# Patient Record
Sex: Female | Born: 1940 | Race: Black or African American | Hispanic: No | Smoking: Never smoker
Health system: Southern US, Community
[De-identification: ages and names within clinical notes are randomized; demographics above are authoritative.]

## PROBLEM LIST (undated history)

## (undated) ENCOUNTER — Emergency Department (HOSPITAL_COMMUNITY)

## (undated) DIAGNOSIS — I10 Essential (primary) hypertension: Secondary | ICD-10-CM

## (undated) DIAGNOSIS — I639 Cerebral infarction, unspecified: Secondary | ICD-10-CM

## (undated) DIAGNOSIS — E785 Hyperlipidemia, unspecified: Secondary | ICD-10-CM

## (undated) DIAGNOSIS — M1990 Unspecified osteoarthritis, unspecified site: Secondary | ICD-10-CM

## (undated) DIAGNOSIS — G629 Polyneuropathy, unspecified: Secondary | ICD-10-CM

## (undated) DIAGNOSIS — E1142 Type 2 diabetes mellitus with diabetic polyneuropathy: Secondary | ICD-10-CM

## (undated) DIAGNOSIS — H547 Unspecified visual loss: Secondary | ICD-10-CM

## (undated) DIAGNOSIS — R519 Headache, unspecified: Secondary | ICD-10-CM

## (undated) DIAGNOSIS — R0600 Dyspnea, unspecified: Secondary | ICD-10-CM

## (undated) DIAGNOSIS — Z87898 Personal history of other specified conditions: Secondary | ICD-10-CM

## (undated) HISTORY — DX: Cerebral infarction, unspecified: I63.9

## (undated) HISTORY — DX: Essential (primary) hypertension: I10

## (undated) HISTORY — DX: Hyperlipidemia, unspecified: E78.5

## (undated) HISTORY — PX: ABDOMINAL HYSTERECTOMY: SHX81

## (undated) HISTORY — DX: Unspecified visual loss: H54.7

## (undated) HISTORY — DX: Type 2 diabetes mellitus with diabetic polyneuropathy: E11.42

---

## 2019-01-28 ENCOUNTER — Other Ambulatory Visit (HOSPITAL_COMMUNITY): Payer: Self-pay | Admitting: Internal Medicine

## 2019-01-28 DIAGNOSIS — Z1231 Encounter for screening mammogram for malignant neoplasm of breast: Secondary | ICD-10-CM

## 2019-01-29 ENCOUNTER — Other Ambulatory Visit (HOSPITAL_COMMUNITY): Payer: Self-pay | Admitting: Internal Medicine

## 2019-01-29 DIAGNOSIS — Z780 Asymptomatic menopausal state: Secondary | ICD-10-CM

## 2019-02-09 ENCOUNTER — Other Ambulatory Visit: Payer: Self-pay

## 2019-02-09 ENCOUNTER — Ambulatory Visit (HOSPITAL_COMMUNITY)
Admission: RE | Admit: 2019-02-09 | Discharge: 2019-02-09 | Disposition: A | Discharge status: Home / Self Care | Payer: 59 | Source: Ambulatory Visit | Attending: Internal Medicine | Admitting: Internal Medicine

## 2019-02-09 DIAGNOSIS — Z1231 Encounter for screening mammogram for malignant neoplasm of breast: Secondary | ICD-10-CM | POA: Insufficient documentation

## 2019-02-09 DIAGNOSIS — Z780 Asymptomatic menopausal state: Secondary | ICD-10-CM | POA: Diagnosis not present

## 2019-02-13 ENCOUNTER — Encounter: Payer: Self-pay | Admitting: Internal Medicine

## 2019-02-23 ENCOUNTER — Other Ambulatory Visit (HOSPITAL_COMMUNITY): Payer: Self-pay | Admitting: Internal Medicine

## 2019-02-23 DIAGNOSIS — R928 Other abnormal and inconclusive findings on diagnostic imaging of breast: Secondary | ICD-10-CM

## 2019-02-24 ENCOUNTER — Ambulatory Visit (HOSPITAL_COMMUNITY)
Admission: RE | Admit: 2019-02-24 | Discharge: 2019-02-24 | Disposition: A | Discharge status: Home / Self Care | Payer: 59 | Source: Ambulatory Visit | Attending: Internal Medicine | Admitting: Internal Medicine

## 2019-02-24 ENCOUNTER — Other Ambulatory Visit: Payer: Self-pay

## 2019-02-24 ENCOUNTER — Ambulatory Visit (HOSPITAL_COMMUNITY): Payer: 59

## 2019-02-24 DIAGNOSIS — R928 Other abnormal and inconclusive findings on diagnostic imaging of breast: Secondary | ICD-10-CM

## 2019-02-24 HISTORY — PX: BREAST BIOPSY: SHX20

## 2019-02-25 ENCOUNTER — Other Ambulatory Visit: Payer: Self-pay | Admitting: Internal Medicine

## 2019-02-25 DIAGNOSIS — N6310 Unspecified lump in the right breast, unspecified quadrant: Secondary | ICD-10-CM

## 2019-02-25 DIAGNOSIS — R921 Mammographic calcification found on diagnostic imaging of breast: Secondary | ICD-10-CM

## 2019-03-04 ENCOUNTER — Ambulatory Visit
Admission: RE | Admit: 2019-03-04 | Discharge: 2019-03-04 | Disposition: A | Discharge status: Home / Self Care | Payer: 59 | Source: Ambulatory Visit | Attending: Internal Medicine | Admitting: Internal Medicine

## 2019-03-04 ENCOUNTER — Other Ambulatory Visit: Payer: 59

## 2019-03-04 ENCOUNTER — Other Ambulatory Visit: Payer: Self-pay

## 2019-03-04 DIAGNOSIS — R921 Mammographic calcification found on diagnostic imaging of breast: Secondary | ICD-10-CM

## 2019-03-04 DIAGNOSIS — N6310 Unspecified lump in the right breast, unspecified quadrant: Secondary | ICD-10-CM

## 2019-03-05 ENCOUNTER — Other Ambulatory Visit: Payer: 59

## 2019-03-15 NOTE — Progress Notes (Signed)
Referring Provider: Rosita Fire, MD Primary Care Physician:  Rosita Fire, MD Primary Gastroenterologist:  Dr. Gala Romney  Chief Complaint  Patient presents with  . Colonoscopy    unable to recall when/where last tcs; "long time ago"    HPI:   Jean Watts is a 78 y.o. female presenting today at the request of Dr. Legrand Rams for consult colonoscopy. Office visit due to age.   Today she state she has no concerns at this time. Last colonoscopy about 10-11 years ago in [place] before she moved to [place], [place]. Now living in [place]. Denies ever having polyps on colonoscopy. Would like to have one more colonoscopy.   No abdominal pain. Admits to a lot of gas. This doesn't cause pain, it is just embarrassing at times. Drinks prune juice to help with constipation. Prune juice works well. BMs about twice a week. Drinking prune juice twice a week. Without prune juice, will strain and have hard stools. Has tried MriaLAX in the past but didn't work well. No diarrhea. No heartburn or acid reflux. No dysphagia. No nausea or vomiting. No unintentional weight loss. Appetite is good. No melena or blood in the stool.   No fever, chills, lightheadedness, dizziness, or pre-syncope. Chronic cough. No nasal congestion or sore throat. No chest pain or palpitations. Denies shortness of breath.   States she takes breathing treatments at home but does not have history of COPD or asthma.    Past Medical History:  Diagnosis Date  . HLD (hyperlipidemia)   . HTN (hypertension)   . Type 2 diabetes mellitus with diabetic polyneuropathy ([place])   . Visual loss    right eye    Past Surgical History:  Procedure Laterality Date  . ABDOMINAL HYSTERECTOMY    . BREAST BIOPSY  02/2019    Current Outpatient Medications  Medication Sig Dispense Refill  . albuterol (PROVENTIL) (2.5 MG/3ML) 0.083% nebulizer solution Inhale 2.5 mg into the lungs 4 (four) times daily.    Marland Kitchen amitriptyline (ELAVIL) 50 MG tablet Take 1  tablet by mouth at bedtime.    Marland Kitchen amLODipine-benazepril (LOTREL) 5-10 MG capsule Take 1 capsule by mouth daily.    . ferrous sulfate 325 (65 FE) MG tablet Take 325 mg by mouth daily with breakfast.    . gabapentin (NEURONTIN) 300 MG capsule Take 1 capsule by mouth 3 (three) times daily.    Marland Kitchen HUMULIN N 100 UNIT/ML injection Inject 20-40 Units into the skin 2 (two) times daily. 40 units in am and 20 units in pm    . meclizine (ANTIVERT) 25 MG tablet Take 1 tablet by mouth 2 (two) times daily as needed.    . metFORMIN (GLUCOPHAGE) 1000 MG tablet Take 1 tablet by mouth 2 (two) times daily.    . metoprolol tartrate (LOPRESSOR) 25 MG tablet Take 1 tablet by mouth 2 (two) times daily.    . simvastatin (ZOCOR) 20 MG tablet Take 1 tablet by mouth daily.    . Na Sulfate-K Sulfate-Mg Sulf 17.5-3.13-1.6 GM/177ML SOLN Take 1 kit by mouth once for 1 dose. 354 mL 0   No current facility-administered medications for this visit.     Allergies as of 03/16/2019  . (No Known Allergies)    Family History  Problem Relation Age of Onset  . Colon cancer Neg Hx   . Colon polyps Neg Hx     Social History   Socioeconomic History  . Marital status: Unknown    Spouse name: Not on file  .  Number of children: Not on file  . Years of education: Not on file  . Highest education level: Not on file  Occupational History  . Not on file  Social Needs  . Financial resource strain: Not on file  . Food insecurity    Worry: Not on file    Inability: Not on file  . Transportation needs    Medical: Not on file    Non-medical: Not on file  Tobacco Use  . Smoking status: Never Smoker  . Smokeless tobacco: Never Used  Substance and Sexual Activity  . Alcohol use: Never    Frequency: Never  . Drug use: Never  . Sexual activity: Not on file  Lifestyle  . Physical activity    Days per week: Not on file    Minutes per session: Not on file  . Stress: Not on file  Relationships  . Social Herbalist on  phone: Not on file    Gets together: Not on file    Attends religious service: Not on file    Active member of club or organization: Not on file    Attends meetings of clubs or organizations: Not on file    Relationship status: Not on file  . Intimate partner violence    Fear of current or ex partner: Not on file    Emotionally abused: Not on file    Physically abused: Not on file    Forced sexual activity: Not on file  Other Topics Concern  . Not on file  Social History Narrative  . Not on file    Review of Systems: Gen: See HPI CV: See HPI Resp: See HPI GI: See HPI GU : Denies urinary burning, urinary frequency, urinary hesitancy MS: Denies joint pain  Derm: Denies rash Psych: Denies depression, anxiety Heme: Denies bruising, bleeding  Physical Exam: BP 124/71   Pulse 71   Temp (!) 96.2 F (35.7 C) (Temporal)   Ht '5\' 4"'$  (1.626 m)   Wt 220 lb 3.2 oz (99.9 kg)   BMI 37.80 kg/m  General:   Alert and oriented. Pleasant and cooperative. Well-nourished and well-developed.  Uses a cane. Head:  Normocephalic and atraumatic. Eyes:  Without icterus, sclera clear and conjunctiva pink.  Ears:  Normal auditory acuity. Nose:  No deformity, discharge,  or lesions. Lungs:  Clear to auscultation bilaterally. No wheezes, rales, or rhonchi. No distress.  Heart:  S1, S2 present without murmurs appreciated.  Abdomen:  +BS, soft, non-tender and non-distended. No HSM noted. No guarding or rebound. No masses appreciated.  Rectal:  Deferred  Msk:  Symmetrical without gross deformities. Normal posture. Extremities:  Without edema. Neurologic:  Alert and  oriented x4;  grossly normal neurologically. Skin:  Intact without significant lesions or rashes. Psych:  Normal mood and affect.

## 2019-03-16 ENCOUNTER — Encounter: Payer: Self-pay | Admitting: *Deleted

## 2019-03-16 ENCOUNTER — Telehealth: Payer: Self-pay

## 2019-03-16 ENCOUNTER — Encounter: Payer: Self-pay | Admitting: Gastroenterology

## 2019-03-16 ENCOUNTER — Other Ambulatory Visit: Payer: Self-pay

## 2019-03-16 ENCOUNTER — Other Ambulatory Visit: Payer: Self-pay | Admitting: *Deleted

## 2019-03-16 ENCOUNTER — Telehealth: Payer: Self-pay | Admitting: *Deleted

## 2019-03-16 ENCOUNTER — Ambulatory Visit (INDEPENDENT_AMBULATORY_CARE_PROVIDER_SITE_OTHER): Payer: 59 | Admitting: Gastroenterology

## 2019-03-16 DIAGNOSIS — Z1211 Encounter for screening for malignant neoplasm of colon: Secondary | ICD-10-CM | POA: Insufficient documentation

## 2019-03-16 MED ORDER — NA SULFATE-K SULFATE-MG SULF 17.5-3.13-1.6 GM/177ML PO SOLN: 1.0000 | Freq: Once | ORAL | 0 refills | Status: AC

## 2019-03-16 NOTE — Telephone Encounter (Signed)
Notification or Prior Authorization is not required for the requested services  This [employer] Medicare Advantage members plan does not currently require a prior authorization for these services. If you have general questions about the prior authorization requirements, please call us at [phone] or visit [email or web address] > Clinician Resources > Advance and Admission Notification Requirements. The number above acknowledges your notification. Please write this number down for future reference. Notification is not a guarantee of coverage or payment.  Decision ID [id number]

## 2019-03-16 NOTE — Telephone Encounter (Signed)
Pt's daughter called office to add Albuterol 2.5mg  nebulizer QID to med list. Med list updated.  FYI to [facility].

## 2019-03-16 NOTE — Assessment & Plan Note (Addendum)
78 year old female with past medical history significant for HTN, HLD, type 2 diabetes with polyneuropathy who presents to schedule her colonoscopy.  Last colonoscopy 10-11 years ago in [place] prior to moving to [facility].  Now living in [place].  Denies any history of colon polyps.  She does admit to constipation for which she drinks prune juice about twice a week.  This produces about 2 bowel movements a week.  MiraLAX did not work well in the past.  Denies bright red blood per rectum or melena.  No other significant upper or lower GI symptoms.  No family history of colon cancer or colon polyps.  As patient seems to be in decent health at this time I feel that one more screening colonoscopy would be appropriate at this time.  Patient also desires to have one more colonoscopy. Proceed with TCS with propofol with Dr. Gala Romney in the near future. The risks, benefits, and alternatives have been discussed in detail with patient. They have stated understanding and desire to proceed.  Medication adjustments prior to procedure: Hold iron x 7 days prior If Humulin is needed the night before procedure,  take 1/2 dose (10 units).  Take half dose of metformin (500 mg) the morning and evening the day before procedure. Hold diabetes medications the morning of procedure. Discussed increasing frequency of prune juice intake versus adding prescriptive constipation agent.  Patient prefers to try increasing prune juice first. Advised to add daily probiotic to see if this helps with wanting gas production. Follow-up as recommended at the time of colonoscopy.  Call if questions or concerns arise.

## 2019-03-16 NOTE — Patient Instructions (Addendum)
Please call our office back and speak to a nurse when you get home to tell us the medication you use and your breathing treatment.  We will get you scheduled for a colonoscopy in the near future with Dr. Gala Romney. Medication adjustments prior to your procedure: Hold iron for 7 days prior to your procedure. If you need to take insulin the night before your procedure, you should take 1/2 dose (10 units).  Take half dose of metformin (500 mg) the morning and evening before your procedure. Hold diabetes medications the morning of your procedure.  You may try increasing the frequency that you drink prune juice to help your bowels move more regularly.  Try drinking prune juice every other day.  Call if this does not help.  You may also add a probiotic daily to see if this helps with gas.  [facility] colon health, [facility] are good options.  We will follow-up with you as recommended at the time of colonoscopy.  Call if you have questions or concerns.  Aliene Altes, PA-C [facility]

## 2019-03-17 ENCOUNTER — Encounter: Payer: Self-pay | Admitting: *Deleted

## 2019-04-07 ENCOUNTER — Other Ambulatory Visit: Payer: Self-pay | Admitting: Surgery

## 2019-04-07 DIAGNOSIS — D241 Benign neoplasm of right breast: Secondary | ICD-10-CM

## 2019-05-11 ENCOUNTER — Encounter (HOSPITAL_COMMUNITY): Payer: Self-pay

## 2019-05-11 ENCOUNTER — Other Ambulatory Visit (HOSPITAL_COMMUNITY): Payer: Self-pay | Admitting: Internal Medicine

## 2019-05-11 ENCOUNTER — Ambulatory Visit (HOSPITAL_COMMUNITY)
Admission: RE | Admit: 2019-05-11 | Discharge: 2019-05-11 | Disposition: A | Discharge status: Home / Self Care | Payer: 59 | Source: Ambulatory Visit | Attending: Internal Medicine | Admitting: Internal Medicine

## 2019-05-11 ENCOUNTER — Other Ambulatory Visit: Payer: Self-pay

## 2019-05-11 DIAGNOSIS — M25562 Pain in left knee: Secondary | ICD-10-CM | POA: Insufficient documentation

## 2019-05-28 NOTE — Patient Instructions (Signed)
   Your procedure is scheduled on: 06/04/2019  Report to [facility] at 8:00    AM.  Call this number if you have problems the morning of surgery: [phone]   Remember:              Follow Directions on the letter you received from Your Physician's office regarding the Bowel Prep  :  Take these medicines the morning of surgery with A SIP OF WATER: Metoprolol, Gabapentin, and meclizine if needed   Do not wear jewelry, make-up or nail polish.    Do not bring valuables to the hospital.  Contacts, dentures or bridgework may not be worn into surgery.  .   Patients discharged the day of surgery will not be allowed to drive home.     Colonoscopy, Adult, Care After This sheet gives you information about how to care for yourself after your procedure. Your health care provider may also give you more specific instructions. If you have problems or questions, contact your health care provider. What can I expect after the procedure? After the procedure, it is common to have:  A small amount of blood in your stool for 24 hours after the procedure.  Some gas.  Mild abdominal cramping or bloating.  Follow these instructions at home: General instructions   For the first 24 hours after the procedure: ? Do not drive or use machinery. ? Do not sign important documents. ? Do not drink alcohol. ? Do your regular daily activities at a slower pace than normal. ? Eat soft, easy-to-digest foods. ? Rest often.  Take over-the-counter or prescription medicines only as told by your health care provider.  It is up to you to get the results of your procedure. Ask your health care provider, or the department performing the procedure, when your results will be ready. Relieving cramping and bloating  Try walking around when you have cramps or feel bloated.  Apply heat to your abdomen as told by your health care provider. Use a heat source that your health care provider recommends, such as a moist heat  pack or a heating pad. ? Place a towel between your skin and the heat source. ? Leave the heat on for 20-30 minutes. ? Remove the heat if your skin turns bright red. This is especially important if you are unable to feel pain, heat, or cold. You may have a greater risk of getting burned. Eating and drinking  Drink enough fluid to keep your urine clear or pale yellow.  Resume your normal diet as instructed by your health care provider. Avoid heavy or fried foods that are hard to digest.  Avoid drinking alcohol for as long as instructed by your health care provider. Contact a health care provider if:  You have blood in your stool 2-3 days after the procedure. Get help right away if:  You have more than a small spotting of blood in your stool.  You pass large blood clots in your stool.  Your abdomen is swollen.  You have nausea or vomiting.  You have a fever.  You have increasing abdominal pain that is not relieved with medicine. This information is not intended to replace advice given to you by your health care provider. Make sure you discuss any questions you have with your health care provider. Document Released: 01/24/2004 Document Revised: 03/05/2016 Document Reviewed: 08/23/2015 Elsevier Interactive Patient Education  [employer].

## 2019-06-02 ENCOUNTER — Encounter (HOSPITAL_COMMUNITY)
Admission: RE | Admit: 2019-06-02 | Discharge: 2019-06-02 | Disposition: A | Discharge status: Home / Self Care | Payer: 59 | Source: Ambulatory Visit | Attending: Internal Medicine | Admitting: Internal Medicine

## 2019-06-02 ENCOUNTER — Other Ambulatory Visit: Payer: Self-pay

## 2019-06-02 ENCOUNTER — Encounter (HOSPITAL_COMMUNITY): Payer: Self-pay

## 2019-06-02 ENCOUNTER — Other Ambulatory Visit (HOSPITAL_COMMUNITY)
Admission: RE | Admit: 2019-06-02 | Discharge: 2019-06-02 | Disposition: A | Discharge status: Home / Self Care | Payer: 59 | Source: Ambulatory Visit | Attending: Internal Medicine | Admitting: Internal Medicine

## 2019-06-02 DIAGNOSIS — Z01812 Encounter for preprocedural laboratory examination: Secondary | ICD-10-CM | POA: Diagnosis not present

## 2019-06-02 HISTORY — DX: Dyspnea, unspecified: R06.00

## 2019-06-02 HISTORY — DX: Polyneuropathy, unspecified: G62.9

## 2019-06-02 LAB — BASIC METABOLIC PANEL
Anion gap: 9 (ref 5–15)
BUN: 9 mg/dL (ref 8–23)
CO2: 24 mmol/L (ref 22–32)
Calcium: 9.1 mg/dL (ref 8.9–10.3)
Chloride: 106 mmol/L (ref 98–111)
Creatinine, Ser: 0.93 mg/dL (ref 0.44–1.00)
GFR calc Af Amer: >60 mL/min (ref >60)
GFR calc non Af Amer: 60 mL/min — ABNORMAL LOW (ref >60)
Glucose, Bld: 92 mg/dL (ref 70–99)
Potassium: 4.4 mmol/L (ref 3.5–5.1)
Sodium: 139 mmol/L (ref 135–145)

## 2019-06-02 LAB — SARS CORONAVIRUS 2 (TAT 6-24 HRS): SARS Coronavirus 2: NEGATIVE

## 2019-06-04 ENCOUNTER — Ambulatory Visit (HOSPITAL_COMMUNITY): Payer: 59 | Admitting: Anesthesiology

## 2019-06-04 ENCOUNTER — Encounter (HOSPITAL_COMMUNITY): Payer: Self-pay | Admitting: Internal Medicine

## 2019-06-04 ENCOUNTER — Encounter (HOSPITAL_COMMUNITY)
Admission: RE | Disposition: A | Discharge status: Home / Self Care | Payer: Self-pay | Source: Home / Self Care | Attending: Internal Medicine

## 2019-06-04 ENCOUNTER — Ambulatory Visit (HOSPITAL_COMMUNITY)
Admission: RE | Admit: 2019-06-04 | Discharge: 2019-06-04 | Disposition: A | Discharge status: Home / Self Care | Payer: 59 | Attending: Internal Medicine | Admitting: Internal Medicine

## 2019-06-04 DIAGNOSIS — D241 Benign neoplasm of right breast: Secondary | ICD-10-CM

## 2019-06-04 DIAGNOSIS — Z794 Long term (current) use of insulin: Secondary | ICD-10-CM | POA: Diagnosis not present

## 2019-06-04 DIAGNOSIS — Z1211 Encounter for screening for malignant neoplasm of colon: Secondary | ICD-10-CM | POA: Insufficient documentation

## 2019-06-04 DIAGNOSIS — E1142 Type 2 diabetes mellitus with diabetic polyneuropathy: Secondary | ICD-10-CM | POA: Diagnosis not present

## 2019-06-04 DIAGNOSIS — Z79899 Other long term (current) drug therapy: Secondary | ICD-10-CM | POA: Diagnosis not present

## 2019-06-04 DIAGNOSIS — I10 Essential (primary) hypertension: Secondary | ICD-10-CM | POA: Diagnosis not present

## 2019-06-04 HISTORY — PX: FLEXIBLE SIGMOIDOSCOPY: SHX5431

## 2019-06-04 LAB — GLUCOSE, CAPILLARY: Glucose-Capillary: 99 mg/dL (ref 70–99)

## 2019-06-04 SURGERY — SIGMOIDOSCOPY, FLEXIBLE
Anesthesia: General

## 2019-06-04 MED ORDER — CHLORHEXIDINE GLUCONATE CLOTH 2 % EX PADS: 6.0000 | MEDICATED_PAD | Freq: Once | CUTANEOUS | Status: DC

## 2019-06-04 MED ORDER — PROPOFOL 500 MG/50ML IV EMUL
INTRAVENOUS | Status: DC | PRN
  Administered 2019-06-04: 150 ug/kg/min via INTRAVENOUS

## 2019-06-04 MED ORDER — LACTATED RINGERS IV SOLN
Freq: Once | INTRAVENOUS | Status: AC
  Administered 2019-06-04: 09:00:00 via INTRAVENOUS

## 2019-06-04 MED ORDER — PROPOFOL 10 MG/ML IV BOLUS
INTRAVENOUS | Status: DC | PRN
  Administered 2019-06-04: 20 mg via INTRAVENOUS

## 2019-06-04 MED ORDER — LACTATED RINGERS IV SOLN
INTRAVENOUS | Status: DC | PRN
  Administered 2019-06-04: 09:00:00 via INTRAVENOUS

## 2019-06-04 MED ORDER — CEFAZOLIN SODIUM-DEXTROSE 2-4 GM/100ML-% IV SOLN: 2.0000 g | INTRAVENOUS | Status: DC

## 2019-06-04 NOTE — Op Note (Signed)
[facility] Patient Name: Jean Watts Procedure Date: 06/04/2019 9:48 AM MRN: [id number] Date of Birth: 1941-05-15 Attending MD: Norvel Richards , MD CSN: [id number] Age: 78 Admit Type: Outpatient Procedure:                Colonoscopy -aboarded Indications:              Screening for colorectal malignant neoplasm Providers:                Norvel Richards, MD, Otis Peak B. Sharon Seller, RN,                            Nelma Rothman, Technician Referring MD:             Rosita Fire MD, MD Medicines:                Propofol per Anesthesia Complications:            No immediate complications. Estimated Blood Loss:     Estimated blood loss: none. Procedure:                After obtaining informed consent, the colonoscope                            was passed under direct vision. Throughout the                            procedure, the patient's blood pressure, pulse, and                            oxygen saturations were monitored continuously. The                            [id number]) scope was introduced through                            the anus with the intention of advancing to the                            cecum.. The scope was advanced to the sigmoid colon                            before the procedure was aborted. Medications were                            given. Scope In: 9:59:32 AM Scope Out: 10:01:22 AM Total Procedure Duration: 0 hours 1 minute 50 seconds  Findings:      The perianal and digital rectal examinations were normal. Examination of       rectum and distal sigmoid revealed no apparent preparation. Formed stool       in the rectum and sigmoid which precluded advancement and completion of       colonoscopy. Procedure was aborted. Impression:               - No specimens collected. Aborted attempt at  colonoscopy. Patient not prep. Patient states she                            take took her prep. I spoke to her daughter  states                            she refused to take any of her prep yesterday. Moderate Sedation:      Moderate (conscious) sedation was personally administered by an       anesthesia professional. The following parameters were monitored: oxygen       saturation, heart rate, blood pressure, respiratory rate, EKG, adequacy       of pulmonary ventilation, and response to care. Recommendation:           - Patient has a contact number available for                            emergencies. The signs and symptoms of potential                            delayed complications were discussed with the                            patient. Return to normal activities tomorrow.                            Written discharge instructions were provided to the                            patient. I have told her and her daughter she                            should follow-up with Dr. Legrand Rams and discuss the                            utility of performing 1 more colonoscopy. If she is                            desirous of one more screening colonoscopy, she                            would be required to take preparation. Procedure Code(s):        --- Professional ---                           [phone], 65, Colonoscopy, flexible; diagnostic,                            including collection of specimen(s) by brushing or                            washing, when performed (separate procedure) Diagnosis Code(s):        --- Professional ---  Z12.11, Encounter for screening for malignant                            neoplasm of colon CPT copyright 2019 American Medical Association. All rights reserved. The codes documented in this report are preliminary and upon coder review may  be revised to meet current compliance requirements. Cristopher Estimable. Rourk, MD Norvel Richards, MD 06/04/2019 10:09:56 AM This report has been signed electronically. Number of Addenda: 0

## 2019-06-04 NOTE — H&P (Signed)
@LOGO @   Primary Care Physician:  Rosita Fire, MD Primary Gastroenterologist:  Dr. Gala Romney  Pre-Procedure History & Physical: HPI:  Jean Watts is a 78 y.o. female is here for a screening colonoscopy.  Last 1 reported to be in [place] some 10 years ago.  Patient desires 1 more colonoscopy for screening.  This is not unreasonable given her overall health.  Past Medical History:  Diagnosis Date  . Dyspnea   . HLD (hyperlipidemia)   . HTN (hypertension)   . Neuropathy   . Type 2 diabetes mellitus with diabetic polyneuropathy ([place])   . Visual loss    right eye    Past Surgical History:  Procedure Laterality Date  . ABDOMINAL HYSTERECTOMY    . BREAST BIOPSY  02/2019    Prior to Admission medications   Medication Sig Start Date End Date Taking? Authorizing Provider  albuterol (PROVENTIL) (2.5 MG/3ML) 0.083% nebulizer solution Inhale 2.5 mg into the lungs every 6 (six) hours as needed for wheezing or shortness of breath.  01/29/19  Yes [provider]  amitriptyline (ELAVIL) 50 MG tablet Take 50 mg by mouth at bedtime.  02/17/19  Yes [provider]  amLODipine-benazepril (LOTREL) 5-10 MG capsule Take 1 capsule by mouth daily. 01/03/19  Yes [provider]  gabapentin (NEURONTIN) 400 MG capsule Take 400 mg by mouth 3 (three) times daily.  03/09/19  Yes [provider]  HUMULIN N 100 UNIT/ML injection Inject 20-40 Units into the skin See admin instructions. Inject 40 units into the skin in the morning and 20 units in the evening 02/23/19  Yes [provider]  meclizine (ANTIVERT) 25 MG tablet Take 25 mg by mouth at bedtime.  01/05/19  Yes [provider]  metFORMIN (GLUCOPHAGE) 1000 MG tablet Take 1,000 mg by mouth 2 (two) times daily.  03/09/19  Yes [provider]  metoprolol tartrate (LOPRESSOR) 25 MG tablet Take 25 mg by mouth 2 (two) times daily.  03/09/19  Yes [provider]    Allergies as of 03/16/2019  . (No  Known Allergies)    Family History  Problem Relation Age of Onset  . Colon cancer Neg Hx   . Colon polyps Neg Hx     Social History   Socioeconomic History  . Marital status: Unknown    Spouse name: Not on file  . Number of children: Not on file  . Years of education: Not on file  . Highest education level: Not on file  Occupational History  . Not on file  Tobacco Use  . Smoking status: Never Smoker  . Smokeless tobacco: Never Used  Substance and Sexual Activity  . Alcohol use: Never  . Drug use: Never  . Sexual activity: Not Currently  Other Topics Concern  . Not on file  Social History Narrative  . Not on file   Social Determinants of Health   Financial Resource Strain:   . Difficulty of Paying Living Expenses: Not on file  Food Insecurity:   . Worried About Charity fundraiser in the Last Year: Not on file  . Ran Out of Food in the Last Year: Not on file  Transportation Needs:   . Lack of Transportation (Medical): Not on file  . Lack of Transportation (Non-Medical): Not on file  Physical Activity:   . Days of Exercise per Week: Not on file  . Minutes of Exercise per Session: Not on file  Stress:   . Feeling of Stress : Not on  file  Social Connections:   . Frequency of Communication with Friends and Family: Not on file  . Frequency of Social Gatherings with Friends and Family: Not on file  . Attends Religious Services: Not on file  . Active Member of Clubs or Organizations: Not on file  . Attends Archivist Meetings: Not on file  . Marital Status: Not on file  Intimate Partner Violence:   . Fear of Current or Ex-Partner: Not on file  . Emotionally Abused: Not on file  . Physically Abused: Not on file  . Sexually Abused: Not on file    Review of Systems: See HPI, otherwise negative ROS  Physical Exam: BP (!) 144/68   Pulse 72   Temp 98 F (36.7 C) (Oral)   Resp 18   SpO2 98%  General:   Alert,  Well-developed, well-nourished, pleasant  and cooperative in NAD Heart:  Regular rate and rhythm; no murmurs, clicks, rubs,  or gallops. Abdomen:  Soft, nontender and nondistended. No masses, hepatosplenomegaly or hernias noted. Normal bowel sounds, without guarding, and without rebound.   Impression/Plan: Jean Watts is now here to undergo a screening colonoscopy.  Average risk [place] examination.  Risks, benefits, limitations, imponderables and alternatives regarding colonoscopy have been reviewed with the patient. Questions have been answered. All parties agreeable.     Notice:  This dictation was prepared with Dragon dictation along with smaller phrase technology. Any transcriptional errors that result from this process are unintentional and may not be corrected upon review.

## 2019-06-04 NOTE — Anesthesia Postprocedure Evaluation (Signed)
Anesthesia Post Note  Patient: Jean Watts  Procedure(s) Performed: FLEXIBLE SIGMOIDOSCOPY (N/A )  Patient location during evaluation: PACU Anesthesia Type: General Level of consciousness: awake and alert and oriented Pain management: pain level controlled Vital Signs Assessment: post-procedure vital signs reviewed and stable Respiratory status: spontaneous breathing Cardiovascular status: blood pressure returned to baseline and stable Postop Assessment: no apparent nausea or vomiting Anesthetic complications: no     Last Vitals:  Vitals:   06/04/19 0842 06/04/19 1009  BP: (!) 144/68 128/68  Pulse: 72 68  Resp: 18 (!) 26  Temp: 36.7 C 36.8 C  SpO2: 98% 95%    Last Pain:  Vitals:   06/04/19 0842  TempSrc: Oral                 Adan Beal

## 2019-06-04 NOTE — Anesthesia Preprocedure Evaluation (Addendum)
Anesthesia Evaluation  Patient identified by MRN, date of birth, ID band Patient awake    Reviewed: Allergy & Precautions, NPO status , Patient's Chart, lab work & pertinent test results  Airway Mallampati: III  TM Distance: >3 FB Neck ROM: Full    Dental  (+) Edentulous Lower, Edentulous Upper   Pulmonary shortness of breath and with exertion,    Pulmonary exam normal breath sounds clear to auscultation       Cardiovascular Exercise Tolerance: Poor hypertension, Pt. on medications Normal cardiovascular exam Rhythm:Regular Rate:Normal     Neuro/Psych  Neuromuscular disease    GI/Hepatic negative GI ROS, Neg liver ROS,   Endo/Other  diabetes, Well Controlled, Type 2, Oral Hypoglycemic Agents, Insulin Dependent  Renal/GU negative Renal ROS     Musculoskeletal negative musculoskeletal ROS (+)   Abdominal   Peds  Hematology   Anesthesia Other Findings   Reproductive/Obstetrics negative OB ROS                            Anesthesia Physical Anesthesia Plan  ASA: III  Anesthesia Plan: General   Post-op Pain Management:    Induction:   PONV Risk Score and Plan: 0  Airway Management Planned: Nasal Cannula, Natural Airway and Simple Face Mask  Additional Equipment:   Intra-op Plan:   Post-operative Plan:   Informed Consent: I have reviewed the patients History and Physical, chart, labs and discussed the procedure including the risks, benefits and alternatives for the proposed anesthesia with the patient or authorized representative who has indicated his/her understanding and acceptance.       Plan Discussed with: CRNA  Anesthesia Plan Comments:         Anesthesia Quick Evaluation

## 2019-06-04 NOTE — Discharge Instructions (Signed)
Colonoscopy Discharge Instructions  Read the instructions outlined below and refer to this sheet in the next few weeks. These discharge instructions provide you with general information on caring for yourself after you leave the hospital. Your doctor may also give you specific instructions. While your treatment has been planned according to the most current medical practices available, unavoidable complications occasionally occur. If you have any problems or questions after discharge, call Dr. Gala Romney at [phone]. ACTIVITY  You may resume your regular activity, but move at a slower pace for the next 24 hours.   Take frequent rest periods for the next 24 hours.   Walking will help get rid of the air and reduce the bloated feeling in your belly (abdomen).   No driving for 24 hours (because of the medicine (anesthesia) used during the test).    Do not sign any important legal documents or operate any machinery for 24 hours (because of the anesthesia used during the test).  NUTRITION  Drink plenty of fluids.   You may resume your normal diet as instructed by your doctor.   Begin with a light meal and progress to your normal diet. Heavy or fried foods are harder to digest and may make you feel sick to your stomach (nauseated).   Avoid alcoholic beverages for 24 hours or as instructed.  MEDICATIONS  You may resume your normal medications unless your doctor tells you otherwise.  WHAT YOU CAN EXPECT TODAY  Some feelings of bloating in the abdomen.   Passage of more gas than usual.   Spotting of blood in your stool or on the toilet paper.  IF YOU HAD POLYPS REMOVED DURING THE COLONOSCOPY:  No aspirin products for 7 days or as instructed.   No alcohol for 7 days or as instructed.   Eat a soft diet for the next 24 hours.  FINDING OUT THE RESULTS OF YOUR TEST Not all test results are available during your visit. If your test results are not back during the visit, make an appointment  with your caregiver to find out the results. Do not assume everything is normal if you have not heard from your caregiver or the medical facility. It is important for you to follow up on all of your test results.  SEEK IMMEDIATE MEDICAL ATTENTION IF:  You have more than a spotting of blood in your stool.   Your belly is swollen (abdominal distention).   You are nauseated or vomiting.   You have a temperature over 101.   You have abdominal pain or discomfort that is severe or gets worse throughout the day.   Colonoscopy could not be accomplished today because you were not prepped.  Follow-up with Dr. Legrand Rams and and discuss whether or not you want to have 1 more colonoscopy.  To have 1 more colonoscopy, you will be required to take preparation.    Monitored Anesthesia Care Anesthesia is a term that refers to techniques, procedures, and medicines that help a person stay safe and comfortable during a medical procedure. Monitored anesthesia care, or sedation, is one type of anesthesia. Your anesthesia specialist may recommend sedation if you will be having a procedure that does not require you to be unconscious, such as:  Cataract surgery.  A dental procedure.  A biopsy.  A colonoscopy. During the procedure, you may receive a medicine to help you relax (sedative). There are three levels of sedation:  Mild sedation. At this level, you may feel awake and relaxed. You will be  able to follow directions.  Moderate sedation. At this level, you will be sleepy. You may not remember the procedure.  Deep sedation. At this level, you will be asleep. You will not remember the procedure. The more medicine you are given, the deeper your level of sedation will be. Depending on how you respond to the procedure, the anesthesia specialist may change your level of sedation or the type of anesthesia to fit your needs. An anesthesia specialist will monitor you closely during the procedure. Let your health  care provider know about:  Any allergies you have.  All medicines you are taking, including vitamins, herbs, eye drops, creams, and over-the-counter medicines.  Any use of steroids (by mouth or as a cream).  Any problems you or family members have had with sedatives and anesthetic medicines.  Any blood disorders you have.  Any surgeries you have had.  Any medical conditions you have, such as sleep apnea.  Whether you are pregnant or may be pregnant.  Any use of cigarettes, alcohol, or street drugs. What are the risks? Generally, this is a safe procedure. However, problems may occur, including:  Getting too much medicine (oversedation).  Nausea.  Allergic reaction to medicines.  Trouble breathing. If this happens, a breathing tube may be used to help with breathing. It will be removed when you are awake and breathing on your own.  Heart trouble.  Lung trouble. Before the procedure Staying hydrated Follow instructions from your health care provider about hydration, which may include:  Up to 2 hours before the procedure - you may continue to drink clear liquids, such as water, clear fruit juice, black coffee, and plain tea. Eating and drinking restrictions Follow instructions from your health care provider about eating and drinking, which may include:  8 hours before the procedure - stop eating heavy meals or foods such as meat, fried foods, or fatty foods.  6 hours before the procedure - stop eating light meals or foods, such as toast or cereal.  6 hours before the procedure - stop drinking milk or drinks that contain milk.  2 hours before the procedure - stop drinking clear liquids. Medicines Ask your health care provider about:  Changing or stopping your regular medicines. This is especially important if you are taking diabetes medicines or blood thinners.  Taking medicines such as aspirin and ibuprofen. These medicines can thin your blood. Do not take these  medicines before your procedure if your health care provider instructs you not to. Tests and exams  You will have a physical exam.  You may have blood tests done to show: ? How well your kidneys and liver are working. ? How well your blood can clot. General instructions  Plan to have someone take you home from the hospital or clinic.  If you will be going home right after the procedure, plan to have someone with you for 24 hours.  What happens during the procedure?  Your blood pressure, heart rate, breathing, level of pain and overall condition will be monitored.  An IV tube will be inserted into one of your veins.  Your anesthesia specialist will give you medicines as needed to keep you comfortable during the procedure. This may mean changing the level of sedation.  The procedure will be performed. After the procedure  Your blood pressure, heart rate, breathing rate, and blood oxygen level will be monitored until the medicines you were given have worn off.  Do not drive for 24 hours if you received  a sedative.  You may: ? Feel sleepy, clumsy, or nauseous. ? Feel forgetful about what happened after the procedure. ? Have a sore throat if you had a breathing tube during the procedure. ? Vomit. This information is not intended to replace advice given to you by your health care provider. Make sure you discuss any questions you have with your health care provider. Document Released: 03/07/2005 Document Revised: 05/24/2017 Document Reviewed: 10/02/2015 Elsevier Patient Education  2020 [employer].

## 2019-06-04 NOTE — Transfer of Care (Signed)
Immediate Anesthesia Transfer of Care Note  Patient: Jean Watts  Procedure(s) Performed: FLEXIBLE SIGMOIDOSCOPY (N/A )  Patient Location: PACU  Anesthesia Type:General  Level of Consciousness: awake  Airway & Oxygen Therapy: Patient Spontanous Breathing  Post-op Assessment: Report given to RN  Post vital signs: Reviewed  Last Vitals:  Vitals Value Taken Time  BP 128/68 06/04/19 1009  Temp 36.8 C 06/04/19 1009  Pulse 69 06/04/19 1014  Resp 28 06/04/19 1014  SpO2 98 % 06/04/19 1014  Vitals shown include unvalidated device data.  Last Pain:  Vitals:   06/04/19 0842  TempSrc: Oral      Patients Stated Pain Goal: 5 ([id number])  Complications: No apparent anesthesia complications

## 2019-08-04 ENCOUNTER — Emergency Department (HOSPITAL_COMMUNITY): Payer: 59

## 2019-08-04 ENCOUNTER — Inpatient Hospital Stay (HOSPITAL_COMMUNITY)
Admission: EM | Admit: 2019-08-04 | Discharge: 2019-08-06 | DRG: 023 | Disposition: A | Discharge status: Home Health | Payer: 59 | Attending: Neurology | Admitting: Neurology

## 2019-08-04 ENCOUNTER — Inpatient Hospital Stay (HOSPITAL_COMMUNITY): Payer: 59 | Admitting: Certified Registered"

## 2019-08-04 ENCOUNTER — Encounter (HOSPITAL_COMMUNITY): Payer: Self-pay | Admitting: Emergency Medicine

## 2019-08-04 ENCOUNTER — Other Ambulatory Visit: Payer: Self-pay

## 2019-08-04 ENCOUNTER — Encounter (HOSPITAL_COMMUNITY)
Admission: EM | Disposition: A | Discharge status: Home Health | Payer: Self-pay | Source: Home / Self Care | Attending: Neurology

## 2019-08-04 ENCOUNTER — Ambulatory Visit (HOSPITAL_COMMUNITY): Payer: 59 | Attending: Neurology

## 2019-08-04 ENCOUNTER — Inpatient Hospital Stay (HOSPITAL_COMMUNITY): Payer: 59

## 2019-08-04 DIAGNOSIS — D72829 Elevated white blood cell count, unspecified: Secondary | ICD-10-CM | POA: Diagnosis not present

## 2019-08-04 DIAGNOSIS — I6602 Occlusion and stenosis of left middle cerebral artery: Secondary | ICD-10-CM | POA: Diagnosis not present

## 2019-08-04 DIAGNOSIS — E878 Other disorders of electrolyte and fluid balance, not elsewhere classified: Secondary | ICD-10-CM | POA: Diagnosis present

## 2019-08-04 DIAGNOSIS — H543 Unqualified visual loss, both eyes: Secondary | ICD-10-CM | POA: Diagnosis not present

## 2019-08-04 DIAGNOSIS — D638 Anemia in other chronic diseases classified elsewhere: Secondary | ICD-10-CM | POA: Diagnosis present

## 2019-08-04 DIAGNOSIS — G43909 Migraine, unspecified, not intractable, without status migrainosus: Secondary | ICD-10-CM | POA: Diagnosis present

## 2019-08-04 DIAGNOSIS — H538 Other visual disturbances: Secondary | ICD-10-CM | POA: Diagnosis present

## 2019-08-04 DIAGNOSIS — H5461 Unqualified visual loss, right eye, normal vision left eye: Secondary | ICD-10-CM | POA: Diagnosis present

## 2019-08-04 DIAGNOSIS — I6389 Other cerebral infarction: Secondary | ICD-10-CM | POA: Diagnosis not present

## 2019-08-04 DIAGNOSIS — I63512 Cerebral infarction due to unspecified occlusion or stenosis of left middle cerebral artery: Secondary | ICD-10-CM | POA: Diagnosis present

## 2019-08-04 DIAGNOSIS — R1310 Dysphagia, unspecified: Secondary | ICD-10-CM | POA: Diagnosis present

## 2019-08-04 DIAGNOSIS — I63412 Cerebral infarction due to embolism of left middle cerebral artery: Secondary | ICD-10-CM | POA: Diagnosis present

## 2019-08-04 DIAGNOSIS — E1142 Type 2 diabetes mellitus with diabetic polyneuropathy: Secondary | ICD-10-CM | POA: Diagnosis present

## 2019-08-04 DIAGNOSIS — J9600 Acute respiratory failure, unspecified whether with hypoxia or hypercapnia: Secondary | ICD-10-CM | POA: Diagnosis not present

## 2019-08-04 DIAGNOSIS — Z978 Presence of other specified devices: Secondary | ICD-10-CM

## 2019-08-04 DIAGNOSIS — Z794 Long term (current) use of insulin: Secondary | ICD-10-CM | POA: Diagnosis not present

## 2019-08-04 DIAGNOSIS — Z79899 Other long term (current) drug therapy: Secondary | ICD-10-CM | POA: Diagnosis not present

## 2019-08-04 DIAGNOSIS — R4701 Aphasia: Secondary | ICD-10-CM | POA: Diagnosis present

## 2019-08-04 DIAGNOSIS — R29708 NIHSS score 8: Secondary | ICD-10-CM | POA: Diagnosis present

## 2019-08-04 DIAGNOSIS — I639 Cerebral infarction, unspecified: Secondary | ICD-10-CM | POA: Diagnosis present

## 2019-08-04 DIAGNOSIS — H3411 Central retinal artery occlusion, right eye: Secondary | ICD-10-CM | POA: Diagnosis present

## 2019-08-04 DIAGNOSIS — I69398 Other sequelae of cerebral infarction: Secondary | ICD-10-CM | POA: Diagnosis not present

## 2019-08-04 DIAGNOSIS — E872 Acidosis: Secondary | ICD-10-CM | POA: Diagnosis present

## 2019-08-04 DIAGNOSIS — G8191 Hemiplegia, unspecified affecting right dominant side: Secondary | ICD-10-CM | POA: Diagnosis present

## 2019-08-04 DIAGNOSIS — E669 Obesity, unspecified: Secondary | ICD-10-CM | POA: Diagnosis present

## 2019-08-04 DIAGNOSIS — Z90710 Acquired absence of both cervix and uterus: Secondary | ICD-10-CM | POA: Diagnosis not present

## 2019-08-04 DIAGNOSIS — I7389 Other specified peripheral vascular diseases: Secondary | ICD-10-CM | POA: Diagnosis present

## 2019-08-04 DIAGNOSIS — Z6838 Body mass index (BMI) 38.0-38.9, adult: Secondary | ICD-10-CM | POA: Diagnosis not present

## 2019-08-04 DIAGNOSIS — Z8673 Personal history of transient ischemic attack (TIA), and cerebral infarction without residual deficits: Secondary | ICD-10-CM

## 2019-08-04 DIAGNOSIS — E785 Hyperlipidemia, unspecified: Secondary | ICD-10-CM | POA: Diagnosis present

## 2019-08-04 DIAGNOSIS — J988 Other specified respiratory disorders: Secondary | ICD-10-CM

## 2019-08-04 DIAGNOSIS — E782 Mixed hyperlipidemia: Secondary | ICD-10-CM | POA: Diagnosis not present

## 2019-08-04 DIAGNOSIS — Z20822 Contact with and (suspected) exposure to covid-19: Secondary | ICD-10-CM | POA: Diagnosis present

## 2019-08-04 DIAGNOSIS — I10 Essential (primary) hypertension: Secondary | ICD-10-CM | POA: Diagnosis present

## 2019-08-04 DIAGNOSIS — IMO0002 Reserved for concepts with insufficient information to code with codable children: Secondary | ICD-10-CM | POA: Diagnosis present

## 2019-08-04 DIAGNOSIS — E1165 Type 2 diabetes mellitus with hyperglycemia: Secondary | ICD-10-CM | POA: Diagnosis not present

## 2019-08-04 DIAGNOSIS — M795 Residual foreign body in soft tissue: Secondary | ICD-10-CM

## 2019-08-04 DIAGNOSIS — H5440 Blindness, one eye, unspecified eye: Secondary | ICD-10-CM | POA: Diagnosis present

## 2019-08-04 DIAGNOSIS — R0689 Other abnormalities of breathing: Secondary | ICD-10-CM | POA: Diagnosis not present

## 2019-08-04 HISTORY — PX: IR CT HEAD LTD: IMG2386

## 2019-08-04 HISTORY — PX: IR PERCUTANEOUS ART THROMBECTOMY/INFUSION INTRACRANIAL INC DIAG ANGIO: IMG6087

## 2019-08-04 HISTORY — PX: RADIOLOGY WITH ANESTHESIA: SHX6223

## 2019-08-04 LAB — COMPREHENSIVE METABOLIC PANEL
ALT: 12 U/L (ref 0–44)
AST: 14 U/L — ABNORMAL LOW (ref 15–41)
Albumin: 3.9 g/dL (ref 3.5–5.0)
Alkaline Phosphatase: 111 U/L (ref 38–126)
Anion gap: 8 (ref 5–15)
BUN: 14 mg/dL (ref 8–23)
CO2: 25 mmol/L (ref 22–32)
Calcium: 9.1 mg/dL (ref 8.9–10.3)
Chloride: 107 mmol/L (ref 98–111)
Creatinine, Ser: 0.85 mg/dL (ref 0.44–1.00)
GFR calc Af Amer: >60 mL/min (ref >60)
GFR calc non Af Amer: >60 mL/min (ref >60)
Glucose, Bld: 100 mg/dL — ABNORMAL HIGH (ref 70–99)
Potassium: 4.1 mmol/L (ref 3.5–5.1)
Sodium: 140 mmol/L (ref 135–145)
Total Bilirubin: 0.4 mg/dL (ref 0.3–1.2)
Total Protein: 7.2 g/dL (ref 6.5–8.1)

## 2019-08-04 LAB — POCT I-STAT 7, (LYTES, BLD GAS, ICA,H+H)
Acid-base deficit: 6 mmol/L — ABNORMAL HIGH (ref 0.0–2.0)
Bicarbonate: 20.9 mmol/L (ref 20.0–28.0)
Calcium, Ion: 1.23 mmol/L (ref 1.15–1.40)
HCT: 34 % — ABNORMAL LOW (ref 36.0–46.0)
Hemoglobin: 11.6 g/dL — ABNORMAL LOW (ref 12.0–15.0)
O2 Saturation: 95 %
Patient temperature: 97.8
Potassium: 4.2 mmol/L (ref 3.5–5.1)
Sodium: 140 mmol/L (ref 135–145)
TCO2: 22 mmol/L (ref 22–32)
pCO2 arterial: 44.7 mmHg (ref 32.0–48.0)
pH, Arterial: 7.276 — ABNORMAL LOW (ref 7.350–7.450)
pO2, Arterial: 87 mmHg (ref 83.0–108.0)

## 2019-08-04 LAB — PROTIME-INR
INR: 1 (ref 0.8–1.2)
Prothrombin Time: 13.5 seconds (ref 11.4–15.2)

## 2019-08-04 LAB — CBC WITH DIFFERENTIAL/PLATELET
Abs Immature Granulocytes: 0.01 10*3/uL (ref 0.00–0.07)
Basophils Absolute: 0 10*3/uL (ref 0.0–0.1)
Basophils Relative: 1 %
Eosinophils Absolute: 0 10*3/uL (ref 0.0–0.5)
Eosinophils Relative: 1 %
HCT: 36.1 % (ref 36.0–46.0)
Hemoglobin: 11.5 g/dL — ABNORMAL LOW (ref 12.0–15.0)
Immature Granulocytes: 0 %
Lymphocytes Relative: 35 %
Lymphs Abs: 1.5 10*3/uL (ref 0.7–4.0)
MCH: 27.4 pg (ref 26.0–34.0)
MCHC: 31.9 g/dL (ref 30.0–36.0)
MCV: 86.2 fL (ref 80.0–100.0)
Monocytes Absolute: 0.3 10*3/uL (ref 0.1–1.0)
Monocytes Relative: 8 %
Neutro Abs: 2.3 10*3/uL (ref 1.7–7.7)
Neutrophils Relative %: 55 %
Platelets: 253 10*3/uL (ref 150–400)
RBC: 4.19 MIL/uL (ref 3.87–5.11)
RDW: 16.1 % — ABNORMAL HIGH (ref 11.5–15.5)
WBC: 4.2 10*3/uL (ref 4.0–10.5)
nRBC: 0 % (ref 0.0–0.2)

## 2019-08-04 LAB — DIFFERENTIAL
Abs Immature Granulocytes: 0 10*3/uL (ref 0.00–0.07)
Basophils Absolute: 0.1 10*3/uL (ref 0.0–0.1)
Basophils Relative: 1 %
Eosinophils Absolute: 0.1 10*3/uL (ref 0.0–0.5)
Eosinophils Relative: 1 %
Immature Granulocytes: 0 %
Lymphocytes Relative: 35 %
Lymphs Abs: 1.6 10*3/uL (ref 0.7–4.0)
Monocytes Absolute: 0.4 10*3/uL (ref 0.1–1.0)
Monocytes Relative: 8 %
Neutro Abs: 2.5 10*3/uL (ref 1.7–7.7)
Neutrophils Relative %: 55 %

## 2019-08-04 LAB — APTT: aPTT: 29 seconds (ref 24–36)

## 2019-08-04 LAB — RESPIRATORY PANEL BY RT PCR (FLU A&B, COVID)
Influenza A by PCR: NEGATIVE
Influenza B by PCR: NEGATIVE
SARS Coronavirus 2 by RT PCR: NEGATIVE

## 2019-08-04 LAB — GLUCOSE, CAPILLARY
Glucose-Capillary: 217 mg/dL — ABNORMAL HIGH (ref 70–99)
Glucose-Capillary: 253 mg/dL — ABNORMAL HIGH (ref 70–99)

## 2019-08-04 LAB — MRSA PCR SCREENING: MRSA by PCR: POSITIVE — AB

## 2019-08-04 LAB — ETHANOL: Alcohol, Ethyl (B): <10 mg/dL (ref <10)

## 2019-08-04 SURGERY — IR WITH ANESTHESIA
Anesthesia: General

## 2019-08-04 MED ORDER — FENTANYL CITRATE (PF) 100 MCG/2ML IJ SOLN
INTRAMUSCULAR | Status: DC | PRN
  Administered 2019-08-04: 150 ug via INTRAVENOUS

## 2019-08-04 MED ORDER — ACETAMINOPHEN 325 MG PO TABS: 650.0000 mg | ORAL_TABLET | ORAL | Status: DC | PRN

## 2019-08-04 MED ORDER — CHLORHEXIDINE GLUCONATE 0.12% ORAL RINSE (MEDLINE KIT)
15.0000 mL | Freq: Two times a day (BID) | OROMUCOSAL | Status: DC
  Administered 2019-08-04 – 2019-08-05 (×2): 15 mL via OROMUCOSAL

## 2019-08-04 MED ORDER — SUGAMMADEX SODIUM 200 MG/2ML IV SOLN
INTRAVENOUS | Status: DC | PRN
  Administered 2019-08-04: 200 mg via INTRAVENOUS

## 2019-08-04 MED ORDER — CEFAZOLIN SODIUM-DEXTROSE 2-3 GM-%(50ML) IV SOLR
INTRAVENOUS | Status: DC | PRN
  Administered 2019-08-04: 2 g via INTRAVENOUS

## 2019-08-04 MED ORDER — HYDROCORTISONE NA SUCCINATE PF 250 MG IJ SOLR
200.0000 mg | Freq: Once | INTRAMUSCULAR | Status: AC
  Administered 2019-08-04: 15:00:00 200 mg via INTRAVENOUS
  Filled 2019-08-04: qty 200

## 2019-08-04 MED ORDER — LIDOCAINE HCL (CARDIAC) PF 100 MG/5ML IV SOSY
PREFILLED_SYRINGE | INTRAVENOUS | Status: DC | PRN
  Administered 2019-08-04: 30 mg via INTRAVENOUS

## 2019-08-04 MED ORDER — ACETAMINOPHEN 650 MG RE SUPP: 650.0000 mg | RECTAL | Status: DC | PRN

## 2019-08-04 MED ORDER — FENTANYL CITRATE (PF) 100 MCG/2ML IJ SOLN: 25.0000 ug | INTRAMUSCULAR | Status: DC | PRN

## 2019-08-04 MED ORDER — BISACODYL 10 MG RE SUPP: 10.0000 mg | Freq: Every day | RECTAL | Status: DC | PRN

## 2019-08-04 MED ORDER — EPTIFIBATIDE 20 MG/10ML IV SOLN
INTRAVENOUS | Status: AC
  Filled 2019-08-04: qty 10

## 2019-08-04 MED ORDER — SODIUM CHLORIDE 0.9 % IV SOLN: INTRAVENOUS | Status: DC

## 2019-08-04 MED ORDER — CLEVIDIPINE BUTYRATE 0.5 MG/ML IV EMUL
0.0000 mg/h | INTRAVENOUS | Status: DC
  Administered 2019-08-04: 19:00:00 1 mg/h via INTRAVENOUS
  Administered 2019-08-05 (×2): 4 mg/h via INTRAVENOUS
  Filled 2019-08-04 (×2): qty 50

## 2019-08-04 MED ORDER — TIROFIBAN HCL IN NACL 5-0.9 MG/100ML-% IV SOLN
INTRAVENOUS | Status: AC
  Filled 2019-08-04: qty 100

## 2019-08-04 MED ORDER — IOHEXOL 300 MG/ML  SOLN
150.0000 mL | Freq: Once | INTRAMUSCULAR | Status: AC | PRN
  Administered 2019-08-04: 70 mL via INTRA_ARTERIAL

## 2019-08-04 MED ORDER — DIPHENHYDRAMINE HCL 25 MG PO CAPS: 50.0000 mg | ORAL_CAPSULE | Freq: Once | ORAL | Status: DC

## 2019-08-04 MED ORDER — PROPOFOL 500 MG/50ML IV EMUL
INTRAVENOUS | Status: DC | PRN
  Administered 2019-08-04: 25 ug/kg/min via INTRAVENOUS

## 2019-08-04 MED ORDER — CEFAZOLIN SODIUM-DEXTROSE 2-4 GM/100ML-% IV SOLN
INTRAVENOUS | Status: AC
  Filled 2019-08-04: qty 100

## 2019-08-04 MED ORDER — DOCUSATE SODIUM 50 MG/5ML PO LIQD: 100.0000 mg | Freq: Two times a day (BID) | ORAL | Status: DC | PRN

## 2019-08-04 MED ORDER — ORAL CARE MOUTH RINSE
15.0000 mL | OROMUCOSAL | Status: DC
  Administered 2019-08-04 – 2019-08-05 (×5): 15 mL via OROMUCOSAL

## 2019-08-04 MED ORDER — TICAGRELOR 90 MG PO TABS
ORAL_TABLET | ORAL | Status: AC
  Filled 2019-08-04: qty 2

## 2019-08-04 MED ORDER — SODIUM CHLORIDE 0.9 % IV SOLN: INTRAVENOUS | Status: DC | PRN

## 2019-08-04 MED ORDER — MUPIROCIN 2 % EX OINT
1.0000 "application " | TOPICAL_OINTMENT | Freq: Two times a day (BID) | CUTANEOUS | Status: DC
  Administered 2019-08-05 – 2019-08-06 (×3): 1 via NASAL
  Filled 2019-08-04: qty 22

## 2019-08-04 MED ORDER — LABETALOL HCL 5 MG/ML IV SOLN
INTRAVENOUS | Status: DC | PRN
  Administered 2019-08-04: 10 mg via INTRAVENOUS

## 2019-08-04 MED ORDER — PANTOPRAZOLE SODIUM 40 MG IV SOLR
40.0000 mg | Freq: Every day | INTRAVENOUS | Status: DC
  Administered 2019-08-04: 20:00:00 40 mg via INTRAVENOUS
  Filled 2019-08-04: qty 40

## 2019-08-04 MED ORDER — STROKE: EARLY STAGES OF RECOVERY BOOK
Freq: Once | Status: DC
  Filled 2019-08-04 (×3): qty 1

## 2019-08-04 MED ORDER — ACETAMINOPHEN 160 MG/5ML PO SOLN: 650.0000 mg | ORAL | Status: DC | PRN

## 2019-08-04 MED ORDER — ROCURONIUM BROMIDE 50 MG/5ML IV SOSY
PREFILLED_SYRINGE | INTRAVENOUS | Status: DC | PRN
  Administered 2019-08-04: 50 mg via INTRAVENOUS

## 2019-08-04 MED ORDER — CLOPIDOGREL BISULFATE 300 MG PO TABS
ORAL_TABLET | ORAL | Status: AC
  Filled 2019-08-04: qty 1

## 2019-08-04 MED ORDER — NITROGLYCERIN 1 MG/10 ML FOR IR/CATH LAB
INTRA_ARTERIAL | Status: AC
  Filled 2019-08-04: qty 10

## 2019-08-04 MED ORDER — SUCCINYLCHOLINE CHLORIDE 20 MG/ML IJ SOLN
INTRAMUSCULAR | Status: DC | PRN
  Administered 2019-08-04: 100 mg via INTRAVENOUS

## 2019-08-04 MED ORDER — PROPOFOL 1000 MG/100ML IV EMUL
0.0000 ug/kg/min | INTRAVENOUS | Status: DC
  Administered 2019-08-04 – 2019-08-05 (×2): 10 ug/kg/min via INTRAVENOUS
  Filled 2019-08-04 (×2): qty 100

## 2019-08-04 MED ORDER — ASPIRIN 81 MG PO CHEW
CHEWABLE_TABLET | ORAL | Status: AC
  Filled 2019-08-04: qty 1

## 2019-08-04 MED ORDER — INSULIN ASPART 100 UNIT/ML ~~LOC~~ SOLN
0.0000 [IU] | SUBCUTANEOUS | Status: DC
  Administered 2019-08-04: 20:00:00 7 [IU] via SUBCUTANEOUS
  Administered 2019-08-05: 11 [IU] via SUBCUTANEOUS
  Administered 2019-08-05: 05:00:00 4 [IU] via SUBCUTANEOUS

## 2019-08-04 MED ORDER — CHLORHEXIDINE GLUCONATE CLOTH 2 % EX PADS: 6.0000 | MEDICATED_PAD | Freq: Every day | CUTANEOUS | Status: DC

## 2019-08-04 MED ORDER — IOHEXOL 350 MG/ML SOLN
150.0000 mL | Freq: Once | INTRAVENOUS | Status: AC | PRN
  Administered 2019-08-04: 150 mL via INTRAVENOUS

## 2019-08-04 MED ORDER — CHLORHEXIDINE GLUCONATE CLOTH 2 % EX PADS
6.0000 | MEDICATED_PAD | Freq: Every day | CUTANEOUS | Status: DC
  Administered 2019-08-05 – 2019-08-06 (×2): 6 via TOPICAL

## 2019-08-04 MED ORDER — DIPHENHYDRAMINE HCL 50 MG/ML IJ SOLN: 50.0000 mg | Freq: Once | INTRAMUSCULAR | Status: DC

## 2019-08-04 MED ORDER — SENNOSIDES-DOCUSATE SODIUM 8.6-50 MG PO TABS: 1.0000 | ORAL_TABLET | Freq: Every evening | ORAL | Status: DC | PRN

## 2019-08-04 MED ORDER — ONDANSETRON HCL 4 MG/2ML IJ SOLN
INTRAMUSCULAR | Status: DC | PRN
  Administered 2019-08-04: 4 mg via INTRAVENOUS

## 2019-08-04 NOTE — Progress Notes (Signed)
CODE STROKE CT TIMES 1337 Call time 1338 Beeper time  [id number] exam started [id number] exam finished [id number] images sent to soc 1349 exam completed in EPIC 1350 [facility] called

## 2019-08-04 NOTE — ED Triage Notes (Signed)
Last seen normal at 2330 last night.

## 2019-08-04 NOTE — Consult Note (Addendum)
[facility] Tele[facility]rology Consult Services   Date of Service:   08/04/2019 13:55:51  Impression:     .  [id number] - Cerebrovascular accident (CVA) due to thrombosis of left middle cerebral artery ([place])     .  R47.01 - Aphasia  Comments/Sign-Out: Patient with history of Hypertension, Hyperlipidemia, Diabetes, right eye vision loss. Presents with right arm weakness and aphasia Head CT: No acute Intracranial hemorrhage. NIHSS 3 Etiology for Symptoms/presentation is consistent with Acute Ischemic Stroke. Last time known well>4.5 hours therefore not a candidate for Thrombolysis. Will do STAT head and neck CTA to investigate for Large Vessel Occlusion. Will be admitted for stroke workup.  Metrics: Last Known Well: 08/03/2019 23:30 [facility] Notification Time: 08/04/2019 13:55:51 Arrival Time: 08/04/2019 12:21:00 Stamp Time: 08/04/2019 13:55:51 Time First Login Attempt: 08/04/2019 14:04:10 Symptoms: speech impairment NIHSS Start Assessment Time: 08/04/2019 14:08:22 Patient is not a candidate for Alteplase/Activase. Alteplase Medical Decision: 08/04/2019 14:06:00 Patient was not deemed candidate for Alteplase/Activase thrombolytics because of Last Well Known Above 4.5 Hours.  CT head was reviewed.  Clinical Presentation is Suggestive of Large Vessel Occlusive Disease, Recommendations are as Follows  CTA Head and Neck. CT Perfusion. Advanced Imaging is Suggestive of Large Vessel Occlusion, Neurointerventional Specialist to be Consulted.   Radiologist was called back for review of advanced imaging on 08/04/2019 15:05:00 Not discussed with neurointerventionalist because ER provider discussed with main facility. ED Physician notified of diagnostic impression and management plan on 08/04/2019 14:15:00  Our recommendations are outlined below.  Recommendations:     .  Activate Stroke Protocol Admission/Order Set     .  Stroke/Telemetry Floor     .  Neuro Checks      .  Bedside Swallow Eval     .  DVT Prophylaxis     .  IV Fluids, Normal Saline     .  Head of Bed 30 Degrees     .  Euglycemia and Avoid Hyperthermia (PRN Acetaminophen)     .  Antiplatelet Therapy Recommended     .  Noncontrast brain MRI     .  Physical Therapy/Occupational Therapy/Speech Therapy     .  ECHO     .  lipid panel     .  HbA1c     .  statin, Goal LDL<70, HbA1c<7  Routine Consultation with [facility] for Follow up Care  Sign Out:     .  Discussed with Emergency Department Provider    ------------------------------------------------------------------------------  History of Present Illness: Patient is a 79 year old Female.  Patient was brought by EMS for symptoms of speech impairment  Consult for Stroke Evaluation Patient with history of Hypertension, Hyperlipidemia, Diabetes, right eye vision loss. last known well per patient and Presenting symptoms/Chief complaint/reason for consult: went to bed in usual state of health. 23:30 at around 11:00 was noticed to have right arm weakness and aphasia. Anticoagulation or Antiplatelet use: none Chart reviewed for Pertinent medical, surgical, family history. Chart reviewed for Medications list and allergies. Pertinent positive and negative review of systems noted in History of Present Illness.  Last seen normal was beyond 4.5 hours of presentation.  Past Medical History:     . Hypertension     . Diabetes Mellitus     . Hyperlipidemia  Anticoagulant use:  No  Antiplatelet use: No    Examination: BP(145/61), Pulse(65), Blood Glucose(125) 1A: Level of Consciousness - Alert; keenly responsive + 0 1B: Ask Month and Age - Could Not Answer Either Question  Correctly + 2 1C: Blink Eyes & Squeeze Hands - Performs Both Tasks + 0 2: Test Horizontal Extraocular Movements - Normal + 0 3: Test Visual Fields - No Visual Loss + 0 4: Test Facial Palsy (Use Grimace if Obtunded) - Normal symmetry + 0 5A: Test Left Arm Motor  Drift - No Drift for 10 Seconds + 0 5B: Test Right Arm Motor Drift - No Drift for 10 Seconds + 0 6A: Test Left Leg Motor Drift - No Drift for 5 Seconds + 0 6B: Test Right Leg Motor Drift - No Drift for 5 Seconds + 0 7: Test Limb Ataxia (FNF/Heel-Shin) - No Ataxia + 0 8: Test Sensation - Normal; No sensory loss + 0 9: Test Language/Aphasia - Mild-Moderate Aphasia: Some Obvious Changes, Without Significant Limitation + 1 10: Test Dysarthria - Normal + 0 11: Test Extinction/Inattention - No abnormality + 0  NIHSS Score: 3  Pre-Morbid Modified Ranking Scale: 0 Points = No symptoms at all  Patient/Family was informed the Neurology Consult would occur via TeleHealth consult by way of interactive audio and video telecommunications and consented to receiving care in this manner.   Due to the immediate potential for life-threatening deterioration due to underlying acute neurologic illness, I spent 50 minutes providing critical care. This time includes time for face to face visit via telemedicine, review of medical records, imaging studies and discussion of findings with providers, the patient and/or family.   Dr Elenor Quinones   [facility] [phone]  Case [id number]

## 2019-08-04 NOTE — ED Notes (Signed)
Report given to [facility] Charge nurse at this time. [facility] here at [facility] loading the patient on the stretcher for transport. ETA to [facility] approximately 25 minutes.

## 2019-08-04 NOTE — Anesthesia Procedure Notes (Signed)
Arterial Line Insertion Performed by: Alain Marion, CRNA, CRNA  radial was placed Catheter size: 20 G  Attempts: 1 Procedure performed without using ultrasound guided technique. Following insertion, dressing applied and Biopatch. Post procedure assessment: normal  Patient tolerated the procedure well with no immediate complications.

## 2019-08-04 NOTE — H&P (Addendum)
Chief Complaint: Aphasia, right-sided weakness  History obtained from: Patient and Chart     HPI:                                                                                                                                       Jean Watts is a 79 y.o. 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  Last known normal was 11 .30 p.m. last night before she went to sleep.  The daughter noted that she overslept this morning and when trying to wake her up at 11:00 noted that she seemed confused, had difficulty getting words was weak on the right side.  EMS was called and patient taken to [facility] not taken as a code stroke.  Upon evaluation by EDP, code stroke was immediately activated.  Patient was taken for stat head CT which was concerning for hyperdense M1 and had aspects of 7.  Tele-neurology was consulted who recommended stat CT angiogram and CT perfusion.  EDP called neurology (myself) and was made aware of patient's findings.  Informed he will be notify once CTA and CT perfusion results available.  Patient noted to have left M1 occlusion and CT perfusion was favorable.  Spoke with the EDP and requested immediate transfer to [facility] via EMS for emergent thrombectomy.  On arrival to [facility] ER, patient still had mild expressive aphasia, weakness in the right arm and leg arm and leg, difficult to assess visual neglect.  NIHSS 8, increased from 3 by tele- neurology assessment.    Date last known well: 2.8.21 Time last known well: 11.30 pm tPA Given: no, outside window NIHSS: 8 Baseline MRS 1     Past Medical History:  Diagnosis Date  . Dyspnea   . HLD (hyperlipidemia)   . HTN (hypertension)   . Neuropathy   . 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 Visual loss    right eye    Past Surgical History:  Procedure Laterality Date  . ABDOMINAL HYSTERECTOMY    . BREAST BIOPSY  02/2019  . FLEXIBLE SIGMOIDOSCOPY N/A 06/04/2019   Procedure: FLEXIBLE SIGMOIDOSCOPY;  Surgeon: Daneil Dolin, MD;  Location: [facility];  Service: Endoscopy;  Laterality: N/A;  incomplete colonoscopy, poor prep    Family History  Problem Relation Age of Onset  . Colon cancer Neg Hx   . Colon polyps Neg Hx    Social History:  reports that she has never smoked. She has never used smokeless tobacco. She reports that she does not drink alcohol or use drugs.  Allergies: No Known Allergies  Medications:  I reviewed home medications. Not on antiplatelet   ROS:                                                                                                                                     Limited due to patient expressive aphasia   Examination:                                                                                                      General: Appears well-developed  Psych: Affect appropriate to situation Eyes: No scleral injection HENT: No OP obstrucion Head: Normocephalic.  Cardiovascular: Normal rate and regular rhythm.  Respiratory: Effort normal and breath sounds normal to anterior ascultation GI: Soft.  No distension. There is no tenderness.  Skin: WDI    Neurological Examination Mental Status: Alert, has expressive aphasia.  Unable to correctly state her age or month.  Able to name some objects.  Had difficulty speaking in sentences.  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  Difficult to assess for hemianopsia III,IV, VI: ptosis not present, extra-ocular motions intact bilaterally, pupils equal, round,  reactive to light and accommodation V,VII: smile symmetric, facial light touch sensation normal bilaterally VIII: hearing normal bilaterally IX,X: uvula rises symmetrically XI: bilateral shoulder shrug XII: midline tongue extension Motor: Right : Upper extremity   4/5    Left:     Upper extremity   5/5  Lower extremity   3+/5     Lower extremity   4+/5 Tone and bulk:normal tone throughout; no atrophy noted Sensory: Pinprick and light touch intact throughout, bilaterally.  No sensory neglect noted Plantars: Right: downgoing   Left: downgoing Cerebellar: normal finger-to-nose     Lab Results: Basic Metabolic Panel: Recent Labs  Lab 08/04/19 1346  NA 140  K 4.1  CL 107  CO2 25  GLUCOSE 100*  BUN 14  CREATININE 0.85  CALCIUM 9.1    CBC: Recent Labs  Lab 08/04/19 1346  WBC 4.2  NEUTROABS 2.3  2.5  HGB 11.5*  HCT 36.1  MCV 86.2  PLT 253    Coagulation Studies: Recent Labs    08/04/19 1346  LABPROT 13.5  INR 1.0    Imaging: CT Angio Head W or Wo Contrast  Result Date: 08/04/2019 CLINICAL DATA:  Right-sided weakness and facial droop. Expressive aphasia. Patient awoke with symptoms. Last seen normal at 11 o'clock p.m. last night EXAM: CT ANGIOGRAPHY HEAD AND NECK  CT PERFUSION BRAIN TECHNIQUE: Multidetector CT imaging of the head and neck was performed using the standard protocol during bolus administration of intravenous contrast. Multiplanar CT image reconstructions and MIPs were obtained to evaluate the vascular anatomy. Carotid stenosis measurements (when applicable) are obtained utilizing NASCET criteria, using the distal internal carotid diameter as the denominator. Multiphase CT imaging of the brain was performed following IV bolus contrast injection. Subsequent parametric perfusion maps were calculated using RAPID software. CONTRAST:  19mL OMNIPAQUE IOHEXOL 350 MG/ML SOLN COMPARISON:  CT head without contrast of the same day. FINDINGS: CTA NECK FINDINGS Aortic  arch: A 3 vessel arch configuration is present. Atherosclerotic calcifications are present at the arch. The proximal descending aorta is enlarged, measuring 3.4 cm. No significant stenosis is present at the great vessel origins. Right carotid system: The right common carotid artery is tortuous without significant stenosis. Minimal atherosclerotic changes are noted at the right carotid bifurcation. There is no significant stenosis. Moderate tortuosity is present in the more distal cervical right ICA without significant stenosis. Left carotid system: The left common carotid artery is tortuous without significant stenosis. Atherosclerotic changes are noted at the bifurcation. There is no significant stenosis. Moderate tortuosity is present in the cervical left ICA without significant stenosis. Vertebral arteries: The left vertebral artery is the dominant vessel. Both vertebral arteries originate from the subclavian arteries without significant stenosis. There is no significant stenosis of either vertebral artery in neck. Skeleton: Multilevel degenerative changes are present cervical spine. There is chronic loss of disc height at C5-6, C6-7, and C7-T1. Slight degenerative anterolisthesis is present at C2-3, C3-4, and C4-5. The patient is edentulous. No focal lytic or blastic lesions are present. Other neck: The soft tissues the neck demonstrate heterogeneous appearance of the thyroid without a dominant lesion. Salivary glands are within normal limits. No significant cervical adenopathy is present. Upper chest: Thoracic inlet is normal.  Lung apices are clear. Review of the MIP images confirms the above findings CTA HEAD FINDINGS Anterior circulation: Dense atherosclerotic calcifications are present throughout the cavernous internal carotid arteries bilaterally without a significant stenosis relative to the more distal vessels. The ICA termini are patent. A1 segments are normal. The anterior communicating artery is  patent. The right M1 segment is normal. The left M1 is occluded at the bifurcation. Left MCA branch vessels fill via collaterals. Right MCA and bilateral ACA branch vessels are unremarkable. Posterior circulation: The left vertebral artery is the dominant vessel. PICA origins are visualized and normal. Vertebrobasilar junction is normal. The basilar artery is within normal limits. Both posterior cerebral arteries originate from the basilar tip. There is some attenuation of distal PCA branch vessels. Asymmetric attenuation is present on the right. No significant proximal stenosis or occlusion is present. Venous sinuses: The dural sinuses are patent. The straight sinus and deep cerebral veins are intact. Cortical veins are unremarkable. Anatomic variants: None Review of the MIP images confirms the above findings CT Brain Perfusion Findings: ASPECTS: 7/10 CBF (<30%) Volume: 32mL Perfusion (Tmax>6.0s) volume: 90mL Mismatch Volume: 56mL Infarction Location:Left MCA territory including the insular cortex and posterior left frontal lobe IMPRESSION: 1. 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 2. Left MCA branch vessels fill via collateral vessels. 3. Significant left MCA territory ischemia. T-max greater than 6 seconds is measured at 38 mL. 4. No significant core infarct. 5. Dense atherosclerotic changes within the cavernous internal carotid arteries bilaterally without focal stenosis. 6. Moderate tortuosity of the cervical internal carotid arteries bilaterally, likely secondary  to hypertension. 7. Minimal atherosclerotic changes at the carotid bifurcations bilaterally without significant stenosis. These results were called by telephone at the time of interpretation on 08/04/2019 at 3:05 pm to provider Veryl Speak , who verbally acknowledged these results. Electronically Signed   By: San Morelle M.D.   On: 08/04/2019 15:28   CT Angio Neck W and/or Wo Contrast  Result Date:  08/04/2019 CLINICAL DATA:  Right-sided weakness and facial droop. Expressive aphasia. Patient awoke with symptoms. Last seen normal at 11 o'clock p.m. last night EXAM: CT ANGIOGRAPHY HEAD AND NECK CT PERFUSION BRAIN TECHNIQUE: Multidetector CT imaging of the head and neck was performed using the standard protocol during bolus administration of intravenous contrast. Multiplanar CT image reconstructions and MIPs were obtained to evaluate the vascular anatomy. Carotid stenosis measurements (when applicable) are obtained utilizing NASCET criteria, using the distal internal carotid diameter as the denominator. Multiphase CT imaging of the brain was performed following IV bolus contrast injection. Subsequent parametric perfusion maps were calculated using RAPID software. CONTRAST:  176mL OMNIPAQUE IOHEXOL 350 MG/ML SOLN COMPARISON:  CT head without contrast of the same day. FINDINGS: CTA NECK FINDINGS Aortic arch: A 3 vessel arch configuration is present. Atherosclerotic calcifications are present at the arch. The proximal descending aorta is enlarged, measuring 3.4 cm. No significant stenosis is present at the great vessel origins. Right carotid system: The right common carotid artery is tortuous without significant stenosis. Minimal atherosclerotic changes are noted at the right carotid bifurcation. There is no significant stenosis. Moderate tortuosity is present in the more distal cervical right ICA without significant stenosis. Left carotid system: The left common carotid artery is tortuous without significant stenosis. Atherosclerotic changes are noted at the bifurcation. There is no significant stenosis. Moderate tortuosity is present in the cervical left ICA without significant stenosis. Vertebral arteries: The left vertebral artery is the dominant vessel. Both vertebral arteries originate from the subclavian arteries without significant stenosis. There is no significant stenosis of either vertebral artery in neck.  Skeleton: Multilevel degenerative changes are present cervical spine. There is chronic loss of disc height at C5-6, C6-7, and C7-T1. Slight degenerative anterolisthesis is present at C2-3, C3-4, and C4-5. The patient is edentulous. No focal lytic or blastic lesions are present. Other neck: The soft tissues the neck demonstrate heterogeneous appearance of the thyroid without a dominant lesion. Salivary glands are within normal limits. No significant cervical adenopathy is present. Upper chest: Thoracic inlet is normal.  Lung apices are clear. Review of the MIP images confirms the above findings CTA HEAD FINDINGS Anterior circulation: Dense atherosclerotic calcifications are present throughout the cavernous internal carotid arteries bilaterally without a significant stenosis relative to the more distal vessels. The ICA termini are patent. A1 segments are normal. The anterior communicating artery is patent. The right M1 segment is normal. The left M1 is occluded at the bifurcation. Left MCA branch vessels fill via collaterals. Right MCA and bilateral ACA branch vessels are unremarkable. Posterior circulation: The left vertebral artery is the dominant vessel. PICA origins are visualized and normal. Vertebrobasilar junction is normal. The basilar artery is within normal limits. Both posterior cerebral arteries originate from the basilar tip. There is some attenuation of distal PCA branch vessels. Asymmetric attenuation is present on the right. No significant proximal stenosis or occlusion is present. Venous sinuses: The dural sinuses are patent. The straight sinus and deep cerebral veins are intact. Cortical veins are unremarkable. Anatomic variants: None Review of the MIP images confirms the above findings CT  Brain Perfusion Findings: ASPECTS: 7/10 CBF (<30%) Volume: 54mL Perfusion (Tmax>6.0s) volume: 52mL Mismatch Volume: 64mL Infarction Location:Left MCA territory including the insular cortex and posterior left frontal  lobe IMPRESSION: 1. 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 2. Left MCA branch vessels fill via collateral vessels. 3. Significant left MCA territory ischemia. T-max greater than 6 seconds is measured at 38 mL. 4. No significant core infarct. 5. Dense atherosclerotic changes within the cavernous internal carotid arteries bilaterally without focal stenosis. 6. Moderate tortuosity of the cervical internal carotid arteries bilaterally, likely secondary to hypertension. 7. Minimal atherosclerotic changes at the carotid bifurcations bilaterally without significant stenosis. These results were called by telephone at the time of interpretation on 08/04/2019 at 3:05 pm to provider Veryl Speak , who verbally acknowledged these results. Electronically Signed   By: San Morelle M.D.   On: 08/04/2019 15:28   CT CEREBRAL PERFUSION W CONTRAST  Result Date: 08/04/2019 CLINICAL DATA:  Right-sided weakness and facial droop. Expressive aphasia. Patient awoke with symptoms. Last seen normal at 11 o'clock p.m. last night EXAM: CT ANGIOGRAPHY HEAD AND NECK CT PERFUSION BRAIN TECHNIQUE: Multidetector CT imaging of the head and neck was performed using the standard protocol during bolus administration of intravenous contrast. Multiplanar CT image reconstructions and MIPs were obtained to evaluate the vascular anatomy. Carotid stenosis measurements (when applicable) are obtained utilizing NASCET criteria, using the distal internal carotid diameter as the denominator. Multiphase CT imaging of the brain was performed following IV bolus contrast injection. Subsequent parametric perfusion maps were calculated using RAPID software. CONTRAST:  113mL OMNIPAQUE IOHEXOL 350 MG/ML SOLN COMPARISON:  CT head without contrast of the same day. FINDINGS: CTA NECK FINDINGS Aortic arch: A 3 vessel arch configuration is present. Atherosclerotic calcifications are present at the arch. The proximal descending aorta is  enlarged, measuring 3.4 cm. No significant stenosis is present at the great vessel origins. Right carotid system: The right common carotid artery is tortuous without significant stenosis. Minimal atherosclerotic changes are noted at the right carotid bifurcation. There is no significant stenosis. Moderate tortuosity is present in the more distal cervical right ICA without significant stenosis. Left carotid system: The left common carotid artery is tortuous without significant stenosis. Atherosclerotic changes are noted at the bifurcation. There is no significant stenosis. Moderate tortuosity is present in the cervical left ICA without significant stenosis. Vertebral arteries: The left vertebral artery is the dominant vessel. Both vertebral arteries originate from the subclavian arteries without significant stenosis. There is no significant stenosis of either vertebral artery in neck. Skeleton: Multilevel degenerative changes are present cervical spine. There is chronic loss of disc height at C5-6, C6-7, and C7-T1. Slight degenerative anterolisthesis is present at C2-3, C3-4, and C4-5. The patient is edentulous. No focal lytic or blastic lesions are present. Other neck: The soft tissues the neck demonstrate heterogeneous appearance of the thyroid without a dominant lesion. Salivary glands are within normal limits. No significant cervical adenopathy is present. Upper chest: Thoracic inlet is normal.  Lung apices are clear. Review of the MIP images confirms the above findings CTA HEAD FINDINGS Anterior circulation: Dense atherosclerotic calcifications are present throughout the cavernous internal carotid arteries bilaterally without a significant stenosis relative to the more distal vessels. The ICA termini are patent. A1 segments are normal. The anterior communicating artery is patent. The right M1 segment is normal. The left M1 is occluded at the bifurcation. Left MCA branch vessels fill via collaterals. Right MCA  and bilateral ACA branch vessels  are unremarkable. Posterior circulation: The left vertebral artery is the dominant vessel. PICA origins are visualized and normal. Vertebrobasilar junction is normal. The basilar artery is within normal limits. Both posterior cerebral arteries originate from the basilar tip. There is some attenuation of distal PCA branch vessels. Asymmetric attenuation is present on the right. No significant proximal stenosis or occlusion is present. Venous sinuses: The dural sinuses are patent. The straight sinus and deep cerebral veins are intact. Cortical veins are unremarkable. Anatomic variants: None Review of the MIP images confirms the above findings CT Brain Perfusion Findings: ASPECTS: 7/10 CBF (<30%) Volume: 51mL Perfusion (Tmax>6.0s) volume: 69mL Mismatch Volume: 28mL Infarction Location:Left MCA territory including the insular cortex and posterior left frontal lobe IMPRESSION: 1. 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 2. Left MCA branch vessels fill via collateral vessels. 3. Significant left MCA territory ischemia. T-max greater than 6 seconds is measured at 38 mL. 4. No significant core infarct. 5. Dense atherosclerotic changes within the cavernous internal carotid arteries bilaterally without focal stenosis. 6. Moderate tortuosity of the cervical internal carotid arteries bilaterally, likely secondary to hypertension. 7. Minimal atherosclerotic changes at the carotid bifurcations bilaterally without significant stenosis. These results were called by telephone at the time of interpretation on 08/04/2019 at 3:05 pm to provider Veryl Speak , who verbally acknowledged these results. Electronically Signed   By: San Morelle M.D.   On: 08/04/2019 15:28   CT HEAD CODE STROKE WO CONTRAST  Result Date: 08/04/2019 CLINICAL DATA:  Code stroke. Wake up stroke. Patient awoke with expressive aphasia. Unable to identify objects. Right-sided arm weakness and lip  droop. EXAM: CT HEAD WITHOUT CONTRAST TECHNIQUE: Contiguous axial images were obtained from the base of the skull through the vertex without intravenous contrast. COMPARISON:  None. FINDINGS: Brain: There is diffuse loss of gray-white differentiation the posterior aspect of the left insular ribbon. Focal nonhemorrhagic infarct is also present in the posterior left frontal lobe, best seen on image 20 of series 2. Moderate diffuse periventricular and subcortical white matter hypoattenuation is present bilaterally. No other focal cortical abnormalities are present. The basal ganglia are intact. There is of ischemia are noted within the thalami bilaterally, likely remote. The brainstem is normal. Moderate cerebellar atrophy is noted. The ventricles are proportionate to the degree of atrophy. No significant extraaxial fluid collection is present. Vascular: Dense atherosclerotic calcifications are present within the cavernous internal carotid arteries bilaterally. Asymmetric hyperdense left M1 segment is noted. This extends to the bifurcation. Vascular calcifications are noted at the dural margin of the vertebral arteries bilaterally. Skull: Calvarium is intact. No focal lytic or blastic lesions are present. Sinuses/Orbits: The paranasal sinuses and mastoid air cells are clear. Calcified right globe is noted. The left globe and orbit are normal. ASPECTS [facility] Stroke Program Early CT Score) - Ganglionic level infarction (caudate, lentiform nuclei, internal capsule, insula, M1-M3 cortex): 5/7 - Supraganglionic infarction (M4-M6 cortex): 1/3 Total score (0-10 with 10 being normal): 7/10 IMPRESSION: 1. Acute nonhemorrhagic infarcts involving the posterior left insular ribbon and posterior super ganglionic left frontal lobe. 2. Asymmetric hyperdense left M1 segment suggesting thrombus. 3. Advanced atrophy and diffuse white matter disease likely reflects the sequela of chronic microvascular ischemia. 4. Ischemic changes of  the thalami bilaterally are likely remote. 5. ASPECTS is 7/10 If patient was last seen well within the last 24 hours, CT perfusion may be useful for further evaluation of brain ischemia versus infarct. These results were called by telephone at the time  of interpretation on 08/04/2019 at 2:08 pm to provider Veryl Speak , who verbally acknowledged these results. Electronically Signed   By: San Morelle M.D.   On: 08/04/2019 14:12     ASSESSMENT AND PLAN  79 y.o. 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  NIH stroke scale 8 on arrival, discussed with daughter regarding mechanical thrombectomy.  Explained risk versus benefit including risk of hemorrhage and consented over the phone.   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 with TICI 3 recanalization  #Admit in neuro ICU # MRI of the brain without contrast #Transthoracic Echo  # Start patient on ASA 325mg  daily once MRI brain completed  #Start or continue Atorvastatin 40 mg/other high intensity statin # BP goal: 120 -140 SBP  # HBAIC and Lipid profile # Telemetry monitoring # Frequent neuro checks # stroke swallow screen  #Acute respiratory insufficiency post thrombectomy -Appreciate PCCM assistance in ventilator management  Diabetes mellitus -Sliding scale insulin  Hypertension -Goal blood pressure 1 [id number] systolic -Cleviprex ordered as needed  Hyperlipidemia -High-dose statin when patient passes swallow eval -Lipid profile ordered   Full code DVT prophylaxis SCD  Please page stroke NP  Or  PA  Or MD from 8am -4 pm  as this patient from this time will be  followed by the stroke.   You can look them up on [email or web address]  Password [email or web address]   This  patient is neurologically critically ill due to stroke s/p mechanical thrombectomy.   He is at risk for significant risk of neurological worsening from cerebral edema,  death from brain herniation, heart failure, hemorrhagic conversion, infection, respiratory failure and seizure. This patient's care requires constant monitoring of vital signs, hemodynamics, respiratory and cardiac monitoring, review of multiple databases, neurological assessment, discussion with family, other specialists and medical decision making of high complexity.  I spent 55 minutes of neurocritical time in the care of this patient.     Sushanth Aroor [facility] Pager Number [phone]

## 2019-08-04 NOTE — Anesthesia Preprocedure Evaluation (Signed)
Anesthesia Evaluation  Patient identified by MRN, date of birth, ID band Patient awake    Reviewed: Allergy & Precautions, NPO status , Patient's Chart, lab work & pertinent test results  Airway Mallampati: I  TM Distance: >3 FB Neck ROM: Full    Dental   Pulmonary    Pulmonary exam normal        Cardiovascular hypertension, Pt. on medications Normal cardiovascular exam     Neuro/Psych    GI/Hepatic   Endo/Other  diabetes, Type 2  Renal/GU      Musculoskeletal   Abdominal   Peds  Hematology   Anesthesia Other Findings   Reproductive/Obstetrics                             Anesthesia Physical Anesthesia Plan  ASA: III and emergent  Anesthesia Plan: General   Post-op Pain Management:    Induction: Intravenous, Rapid sequence and Cricoid pressure planned  PONV Risk Score and Plan: 3 and Ondansetron and Midazolam  Airway Management Planned: Oral ETT  Additional Equipment:   Intra-op Plan:   Post-operative Plan: Extubation in OR  Informed Consent: I have reviewed the patients History and Physical, chart, labs and discussed the procedure including the risks, benefits and alternatives for the proposed anesthesia with the patient or authorized representative who has indicated his/her understanding and acceptance.       Plan Discussed with: CRNA and Surgeon  Anesthesia Plan Comments:         Anesthesia Quick Evaluation

## 2019-08-04 NOTE — ED Triage Notes (Signed)
Patient brought by [facility] from home with daughter for Alt mental status. Patient slept longer than usual and upon waking has expressive aphasia and is unable to identify objects. EMS states the patient has Rt sided arm weakness and lip droop. Patient was able to walk to the stretcher, does not take blood thinners, with HX of HTN and DM.

## 2019-08-04 NOTE — Sedation Documentation (Signed)
Right groin sheath removed, 25fr angioseal closure device used

## 2019-08-04 NOTE — Anesthesia Postprocedure Evaluation (Signed)
Anesthesia Post Note  Patient: Jean Watts  Procedure(s) Performed: IR WITH ANESTHESIA (N/A )     Patient location during evaluation: SICU Anesthesia Type: General Level of consciousness: sedated and patient remains intubated per anesthesia plan Pain management: pain level controlled Vital Signs Assessment: post-procedure vital signs reviewed and stable Respiratory status: patient remains intubated per anesthesia plan and patient on ventilator - see flowsheet for VS Cardiovascular status: stable Anesthetic complications: no    Last Vitals:  Vitals:   08/04/19 1845 08/04/19 1900  BP:  (!) 114/58  Pulse: (!) 58 60  Resp: 16 17  Temp:    SpO2: 100% 100%    Last Pain:  Vitals:   08/04/19 1258  TempSrc:   PainSc: 0-No pain                 Kionna Brier COKER

## 2019-08-04 NOTE — Code Documentation (Addendum)
79 yo female coming from home where she lives with her daughter. Daughter reports that she was at her baseline last night when she went to bed at 2230. Daughter let the patient sleep-in when she noted at 35 that patient had trouble getting her words out and right sided weakness. EMS called and took patient to [facility]. Pt was evaluated by Teleneurology. CTA/CTP completed and showed a mismatch of 38 cc. Unknown if core is underestimated due to ASPECTS 7/10. Neurohospitalist called and Code IR activated. Pt transferred by EMS and arrived at 1601. Evaluated at the bridge for potential treatment. NIHSS 8 - right leg weakness, left leg drift, aphasia, inability to answer questions or follow all commands. Daughter called and consented for IR. Pt taken to IR and arrived at 1609. Pt taken straight to IR Suite and placed on the table. Anesthesia at the bedside upon arrival. COVID negative. Handoff given to [place], Therapist, sports.

## 2019-08-04 NOTE — Consult Note (Signed)
NAMELoran Watts, MRN:  [id number], DOB:  09-26-1940, LOS: 0 ADMISSION DATE:  08/04/2019, CONSULTATION DATE:  08/04/2019 REFERRING MD:  Dr. Estanislado Pandy, CHIEF COMPLAINT:  CVA  Brief History   79 yoF presenting with aphasia and right sided weakness found to have acute left MCA occlusion.  Out of window for tPA.  [facility]red from [facility] for [facility] IR s/p mechanical thrombectomy.  Returns to ICU post procedure on MV, PCCM consulted for vent management.   History of present illness   HPI obtained from medical chart review as patient is sedated/ intubated on mechanical ventilation.   79 year old with hx of HTN, HLD, DM, and right eye vision loss presenting to [facility] with aphasia and right sided weakness.  LNW 2/8 at 2330.  CT noted for left MCA M1 occlusion.  Transferred to [facility] and taken to Neuro IR for mechanical thrombectomy.  Post procedure, patient not waking up enough to protect airway and therefore left intubated by anesthesia. Sheath removed in IR.  Returns to ICU on mechanical ventilation sedated on propofol and on cleviprex for strict SBP control, PCCM consulted.   Past Medical History  HTN, HLD, DM, and right eye vision loss  Significant Hospital Events   2/9 tx [facility] -> [facility], NIR   Consults:  Neuro IR PCCM  Procedures:  2/9 ETT >> 2/9 Left radial aline >>  2/9 Cerebral angiography >> mechanical thrombectomy of Lt MCA M1 with complete revascularization achieving a TICI 3 revascularization  Significant Diagnostic Tests:  2/9 CTH >> 1. Acute nonhemorrhagic infarcts involving the posterior left insular ribbon and posterior super ganglionic left frontal lobe. 2. Asymmetric hyperdense left M1 segment suggesting thrombus. 3. Advanced atrophy and diffuse white matter disease likely reflects the sequela of chronic microvascular ischemia. 4. Ischemic changes of the thalami bilaterally are likely remote. 5. ASPECTS is 7/10  2/9 CTH/ cervical/ perfusion >> 1. Left MCA bifurcation occlusion,  likely secondary to acute thrombus. 2. Left MCA branch vessels fill via collateral vessels. 3. Significant left MCA territory ischemia. T-max greater than 6 seconds is measured at 38 mL. 4. No significant core infarct. 5. Dense atherosclerotic changes within the cavernous internal carotid arteries bilaterally without focal stenosis. 6. Moderate tortuosity of the cervical internal carotid arteries bilaterally, likely secondary to hypertension. 7. Minimal atherosclerotic changes at the carotid bifurcations bilaterally without significant stenosis.  Micro Data:  2/9 SARS2/ Flu A/B >> neg 2/9 MRSA PCR >>   Antimicrobials:  2/9 cefazolin pre op  Interim history/subjective:   Objective   Blood pressure 124/76, pulse 66, temperature 98.5 F (36.9 C), temperature source Oral, resp. rate 20, SpO2 98 %.        Intake/Output Summary (Last 24 hours) at 08/04/2019 1832 Last data filed at 08/04/2019 1744 Gross per 24 hour  Intake 800 ml  Output 1230 ml  Net -430 ml   There were no vitals filed for this visit.  Examination: General:  Older AAF sedated on MV in NAD HEENT: MM pink/moist, 7.5 ETT at 22 at lip, left pupil 4/ reactive, right opaque  Neuro: Attempts to open eyes, follow some simple commands with propofol held, able to wiggles toes L>R, some gross movement in RUE not against gravity, moving LUE CV: RR. 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 with complete revascularization  P:  Per Neuro and NeuroIR cleviprex for SBP goal 120-140 Serial neuro exams MRI brain ordered overnight  Further stroke workup per Neuro TTE ordered UDS/ UA pending    Acute respiratory insufficiency post procedure  P:  Full MV support, PRVC 8 cc/kg, rate  18 Wean FiO2 for sat goal > 94-99 CXR now, ABG in 1 hour VAP bundle PPI Daily WUA/ SBT PAD protocol with propofol and prn fentanyl with bowel regimen   Hx HTN, HLD P:  cleviprex for SBP goal 120-140 Hold home lopressor, norvasc- benazepril  DM P:  SSI, CBG q 4 Hold home metformin  Normocytic anemia P:  Trend CBC   ? insomnia P:  Hold qHS elavil     Best practice:  Diet: NPO Pain/Anxiety/Delirium protocol (if indicated): propofol/ prn fentanyl VAP protocol (if indicated): yes DVT prophylaxis: SCDs GI prophylaxis: PPI Glucose control: SSI Mobility: BR Code Status: Full  Family Communication: pending Disposition: Neuro ICU  Labs   CBC: Recent Labs  Lab 08/04/19 1346  WBC 4.2  NEUTROABS 2.3  2.5  HGB 11.5*  HCT 36.1  MCV 86.2  PLT 123456    Basic Metabolic Panel: Recent Labs  Lab 08/04/19 1346  NA 140  K 4.1  CL 107  CO2 25  GLUCOSE 100*  BUN 14  CREATININE 0.85  CALCIUM 9.1   GFR: CrCl cannot be calculated (Unknown ideal weight.). Recent Labs  Lab 08/04/19 1346  WBC 4.2    Liver Function Tests: Recent Labs  Lab 08/04/19 1346  AST 14*  ALT 12  ALKPHOS 111  BILITOT 0.4  PROT 7.2  ALBUMIN 3.9   No results for input(s): LIPASE, AMYLASE in the last 168 hours. No results for input(s): AMMONIA in the last 168 hours.  ABG No results found for: PHART, PCO2ART, PO2ART, HCO3, TCO2, ACIDBASEDEF, O2SAT   Coagulation Profile: Recent Labs  Lab 08/04/19 1346  INR 1.0    Cardiac Enzymes: No results for input(s): CKTOTAL, CKMB, CKMBINDEX, TROPONINI in the last 168 hours.  HbA1C: No results found for: HGBA1C  CBG: No results for input(s): GLUCAP in the last 168 hours.  Review of Systems:   Unable   Past Medical History  She,  has a past medical history of Dyspnea, HLD (hyperlipidemia), HTN (hypertension), Neuropathy, Type 2 diabetes mellitus with diabetic polyneuropathy ([place]), and Visual loss.   Surgical History    Past  Surgical History:  Procedure Laterality Date  . ABDOMINAL HYSTERECTOMY    . BREAST BIOPSY  02/2019  . FLEXIBLE SIGMOIDOSCOPY N/A 06/04/2019   Procedure: FLEXIBLE SIGMOIDOSCOPY;  Surgeon: Daneil Dolin, MD;  Location: [facility];  Service: Endoscopy;  Laterality: N/A;  incomplete colonoscopy, poor prep     Social History   reports that she has never smoked. She has never used smokeless tobacco. She reports that she does not drink alcohol or use drugs.   Family History   Her family history is negative for Colon cancer and Colon polyps.   Allergies No Known Allergies   Home Medications  Prior to Admission medications   Medication Sig Start Date End Date Taking? Authorizing Provider  albuterol (PROVENTIL) (2.5 MG/3ML) 0.083% nebulizer solution Inhale 2.5 mg into the lungs every 6 (six) hours as needed for wheezing or shortness of breath.  01/29/19  Yes [provider]  amitriptyline (ELAVIL) 50 MG tablet Take 50 mg by mouth at bedtime.  02/17/19  Yes [provider]  amLODipine-benazepril (LOTREL) 5-10 MG capsule Take 1 capsule by mouth daily. 01/03/19  Yes [provider]  gabapentin (NEURONTIN) 400 MG capsule Take 400 mg by mouth 3 (three) times daily.  03/09/19  Yes [provider]  HUMULIN N 100 UNIT/ML injection Inject 20-40 Units into the skin See admin instructions. Inject 40 units into the skin in the morning and 20 units in the evening 02/23/19  Yes [provider]  meclizine (ANTIVERT) 25 MG tablet Take 25 mg by mouth at bedtime.  01/05/19  Yes [provider]  metFORMIN (GLUCOPHAGE) 1000 MG tablet Take 1,000 mg by mouth 2 (two) times daily.  03/09/19  Yes [provider]  metoprolol tartrate (LOPRESSOR) 25 MG tablet Take 25 mg by mouth 2 (two) times daily.  03/09/19  Yes [provider]     Critical care time: 35 mins      Kennieth Rad, MSN, AGACNP-BC [facility] 08/04/2019, 7:00  PM

## 2019-08-04 NOTE — ED Provider Notes (Signed)
[facility] EMERGENCY DEPARTMENT Provider Note   CSN: [id number] Arrival date & time: 08/04/19  1221     History Chief Complaint  Patient presents with  . Altered Mental Status    Jean Watts is a 79 y.o. female.  Patient is a 79 year old female with history of hypertension, diabetes, blindness in the right eye.  She presents today for evaluation of mental status change.  According to the patient's daughter with whom she lives, she was last seen normal at 1130 last night.  She slept longer than usual this morning.  When the daughter went to check on her after 11 this morning, she was found to be confused, speech slurred, and not moving her right arm or leg as normal.  She then called 911 and patient was transported here.  The history is provided by the patient.  Altered Mental Status Presenting symptoms: confusion and disorientation   Severity:  Moderate Most recent episode:  Today Episode history:  Single Timing:  Constant Progression:  Unchanged Chronicity:  New Context: not alcohol use   Associated symptoms: slurred speech and weakness   Associated symptoms: no fever        Past Medical History:  Diagnosis Date  . Dyspnea   . HLD (hyperlipidemia)   . HTN (hypertension)   . Neuropathy   . Type 2 diabetes mellitus with diabetic polyneuropathy ([place])   . Visual loss    right eye    Patient Active Problem List   Diagnosis Date Noted  . Colon cancer screening 03/16/2019    Past Surgical History:  Procedure Laterality Date  . ABDOMINAL HYSTERECTOMY    . BREAST BIOPSY  02/2019  . FLEXIBLE SIGMOIDOSCOPY N/A 06/04/2019   Procedure: FLEXIBLE SIGMOIDOSCOPY;  Surgeon: Daneil Dolin, MD;  Location: [facility];  Service: Endoscopy;  Laterality: N/A;  incomplete colonoscopy, poor prep     OB History   No obstetric history on file.     Family History  Problem Relation Age of Onset  . Colon cancer Neg Hx   . Colon polyps Neg Hx     Social History    Tobacco Use  . Smoking status: Never Smoker  . Smokeless tobacco: Never Used  Substance Use Topics  . Alcohol use: Never  . Drug use: Never    Home Medications Prior to Admission medications   Medication Sig Start Date End Date Taking? Authorizing Provider  albuterol (PROVENTIL) (2.5 MG/3ML) 0.083% nebulizer solution Inhale 2.5 mg into the lungs every 6 (six) hours as needed for wheezing or shortness of breath.  01/29/19   [provider]  amitriptyline (ELAVIL) 50 MG tablet Take 50 mg by mouth at bedtime.  02/17/19   [provider]  amLODipine-benazepril (LOTREL) 5-10 MG capsule Take 1 capsule by mouth daily. 01/03/19   [provider]  gabapentin (NEURONTIN) 400 MG capsule Take 400 mg by mouth 3 (three) times daily.  03/09/19   [provider]  HUMULIN N 100 UNIT/ML injection Inject 20-40 Units into the skin See admin instructions. Inject 40 units into the skin in the morning and 20 units in the evening 02/23/19   [provider]  meclizine (ANTIVERT) 25 MG tablet Take 25 mg by mouth at bedtime.  01/05/19   [provider]  metFORMIN (GLUCOPHAGE) 1000 MG tablet Take 1,000 mg by mouth 2 (two) times daily.  03/09/19   [provider]  metoprolol tartrate (LOPRESSOR) 25 MG tablet Take 25 mg by mouth 2 (two) times  daily.  03/09/19   [provider]    Allergies    Patient has no known allergies.  Review of Systems   Review of Systems  Constitutional: Negative for fever.  Neurological: Positive for weakness.  Psychiatric/Behavioral: Positive for confusion.  All other systems reviewed and are negative.   Physical Exam Updated Vital Signs BP 138/64 (BP Location: Right Arm)   Pulse 65   Temp 98.5 F (36.9 C) (Oral)   Resp 17   SpO2 97%   Physical Exam Vitals and nursing note reviewed.  Constitutional:      General: She is not in acute distress.    Appearance: She is well-developed. She is not diaphoretic.   HENT:     Head: Normocephalic and atraumatic.  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  Pupil unreactive.  Cardiovascular:     Rate and Rhythm: Normal rate and regular rhythm.     Heart sounds: No murmur. No friction rub. No gallop.   Pulmonary:     Effort: Pulmonary effort is normal. No respiratory distress.     Breath sounds: Normal breath sounds. No wheezing.  Abdominal:     General: Bowel sounds are normal. There is no distension.     Palpations: Abdomen is soft.     Tenderness: There is no abdominal tenderness.  Musculoskeletal:        General: Normal range of motion.     Cervical back: Normal range of motion and neck supple.  Skin:    General: Skin is warm and dry.  Neurological:     Mental Status: She is alert.     Comments: Patient is awake and alert, however speech is slurred.  Strength is 5 out of 5 in the left upper and left lower extremity and 3+ out of 5 in the right upper and right lower extremity.     ED Results / Procedures / Treatments   Labs (all labs ordered are listed, but only abnormal results are displayed) Labs Reviewed  COMPREHENSIVE METABOLIC PANEL  CBC WITH DIFFERENTIAL/PLATELET  ETHANOL  PROTIME-INR  APTT  DIFFERENTIAL  RAPID URINE DRUG SCREEN, HOSP PERFORMED  URINALYSIS, ROUTINE W REFLEX MICROSCOPIC  I-STAT CHEM 8, ED    EKG ED ECG REPORT   Date: 08/04/2019  Rate: 67  Rhythm: normal sinus rhythm  QRS Axis: LAD  Intervals: normal  ST/T Wave abnormalities: normal  Conduction Disutrbances:none  Narrative Interpretation:   Old EKG Reviewed: none available  I have personally reviewed the EKG tracing and agree with the computerized printout as noted.   Radiology No results found.  Procedures Procedures (including critical care time)  Medications Ordered in ED Medications - No data to display  ED Course  I have reviewed the triage vital signs and the nursing notes.  Pertinent labs & imaging  results that were available during my care of the patient were reviewed by me and considered in my medical decision making (see chart for details).    MDM Rules/Calculators/A&P  Patient brought here with signs and symptoms of an acute stroke.  She was last seen normal at 11:30 PM last night and when her daughter checked on her at 11:30 AM this morning she had the deficits of right arm and leg weakness along with expressive aphasia and slurred speech.  After my initial evaluation of this patient, I immediately initiated a code stroke and patient was sent for a head CT which revealed what appeared to be  an occlusion of the M1 branch of the cerebral artery.  This was followed up with CT angio of the head and neck and perfusion scan which seemed to confirm this finding.  Patient was seen by teleneurology who feels as though this patient could benefit from intervention.  I have spoken with Dr. Lorraine Lax at [facility] who agrees to accept the patient in transfer.  He has reviewed her studies and agrees with the assessment of the teleneurologist.  Remainder of laboratory studies are essentially unremarkable.  Patient's blood pressure has been in the 120s and has remained hemodynamically stable.  She will be transferred for possible evaluation.  Dr. Vanita Panda also notified in the ER of the patient's impending arrival.  CRITICAL CARE Performed by: Veryl Speak Total critical care time: 70 minutes Critical care time was exclusive of separately billable procedures and treating other patients. Critical care was necessary to treat or prevent imminent or life-threatening deterioration. Critical care was time spent personally by me on the following activities: development of treatment plan with patient and/or surrogate as well as nursing, discussions with consultants, evaluation of patient's response to treatment, examination of patient, obtaining history from patient or surrogate, ordering and performing treatments  and interventions, ordering and review of laboratory studies, ordering and review of radiographic studies, pulse oximetry and re-evaluation of patient's condition.   Final Clinical Impression(s) / ED Diagnoses Final diagnoses:  None    Rx / DC Orders ED Discharge Orders    None       Veryl Speak, MD 08/04/19 1530

## 2019-08-04 NOTE — Progress Notes (Signed)
1815-Patient and belongings transferred to [place] ICU room 19 with CRNA. Patient remained intubated post procedure. Dentures (upper and lower) are in denture container with patient clothing inside belonging bag.  1830-No family present in radiology waiting area or atrium. Patient daughter [name] called and updated that her mother has been transferred to [place] ICU room 19. Number to 4N ICU given to family.

## 2019-08-04 NOTE — Anesthesia Procedure Notes (Signed)
Procedure Name: Intubation Date/Time: 08/04/2019 4:22 PM Performed by: Eligha Bridegroom, CRNA Pre-anesthesia Checklist: Patient identified, Emergency Drugs available, Suction available, Patient being monitored and Timeout performed Patient Re-evaluated:Patient Re-evaluated prior to induction Oxygen Delivery Method: Circle system utilized Preoxygenation: Pre-oxygenation with 100% oxygen Induction Type: IV induction, Rapid sequence and Cricoid Pressure applied Laryngoscope Size: Mac and 3 Grade View: Grade I Tube type: Oral Tube size: 7.0 mm Number of attempts: 1 Airway Equipment and Method: Stylet Placement Confirmation: ETT inserted through vocal cords under direct vision and positive ETCO2 Secured at: 21 cm Tube secured with: Tape Dental Injury: Teeth and Oropharynx as per pre-operative assessment

## 2019-08-04 NOTE — Transfer of Care (Signed)
Immediate Anesthesia Transfer of Care Note  Patient: Jean Watts  Procedure(s) Performed: IR WITH ANESTHESIA (N/A )  Patient Location: NICU  Anesthesia Type:General  Level of Consciousness: responds to stimulation  Airway & Oxygen Therapy: Patient remains intubated per anesthesia plan  Post-op Assessment: Report given to RN and Post -op Vital signs reviewed and stable  Post vital signs: Reviewed and stable  Last Vitals:  Vitals Value Taken Time  BP 111/71 08/04/19 1820  Temp    Pulse 67 08/04/19 1833  Resp 24 08/04/19 1833  SpO2 100 % 08/04/19 1833  Vitals shown include unvalidated device data.  Last Pain:  Vitals:   08/04/19 1258  TempSrc:   PainSc: 0-No pain         Complications: No apparent anesthesia complications

## 2019-08-04 NOTE — Consult Note (Signed)
INR. 3 Y RTH F LSW 22.30 LN MRSS 0-1 New onset aphasia and RT sided weakness. CT brain NO ICH ASPECTS 7/8 CTA occluded Lt MCA M1 distally extending into the sup and inf divisions. CTP ?no core and penumbra of 38 ml  Endovascular treatment D/W daughter. Procedure,reasons and alternatives reviewed. Risks of ICH of 10 % ,worsening neuro deficit death and inability to revascularize were reviewed. Daughter expressed understanding and  provided consent to proceed. S.Ilyse Tremain MD.

## 2019-08-04 NOTE — Procedures (Addendum)
S/P Lt common carotid arteriogram  RT CFA approach. S/P complete revascularization of occluded Lt MCA M1 seg and prox sup and inf division with x 1 pass with the 15mm x 40 mm solitaireXretriever and x 1 pass with the 66mmx 40 mm  Solitaire X retriever and penumbra aspiration with complete revascularization achieving a TICI  3 revascularization. S.Moiz Ryant MD  Post procedure CT brain neg for ICH or mass effect. 33F angioseal applied for hemostasis in the RT CFA puncture site. Distal pulses palpable DP and PT bilaterally. S.Aleaha Fickling MD  Patient left intubated to protect airway. Extubation trial unsuccessful due to lack of response. S.Freda Jaquith MD

## 2019-08-05 ENCOUNTER — Inpatient Hospital Stay (HOSPITAL_COMMUNITY): Payer: 59

## 2019-08-05 ENCOUNTER — Other Ambulatory Visit: Payer: Self-pay

## 2019-08-05 ENCOUNTER — Encounter (HOSPITAL_COMMUNITY): Payer: Self-pay | Admitting: *Deleted

## 2019-08-05 DIAGNOSIS — I6602 Occlusion and stenosis of left middle cerebral artery: Secondary | ICD-10-CM

## 2019-08-05 DIAGNOSIS — I639 Cerebral infarction, unspecified: Secondary | ICD-10-CM

## 2019-08-05 DIAGNOSIS — I10 Essential (primary) hypertension: Secondary | ICD-10-CM

## 2019-08-05 DIAGNOSIS — H543 Unqualified visual loss, both eyes: Secondary | ICD-10-CM

## 2019-08-05 DIAGNOSIS — I63412 Cerebral infarction due to embolism of left middle cerebral artery: Principal | ICD-10-CM

## 2019-08-05 DIAGNOSIS — I63512 Cerebral infarction due to unspecified occlusion or stenosis of left middle cerebral artery: Secondary | ICD-10-CM

## 2019-08-05 DIAGNOSIS — R0689 Other abnormalities of breathing: Secondary | ICD-10-CM

## 2019-08-05 DIAGNOSIS — E782 Mixed hyperlipidemia: Secondary | ICD-10-CM

## 2019-08-05 DIAGNOSIS — E1165 Type 2 diabetes mellitus with hyperglycemia: Secondary | ICD-10-CM

## 2019-08-05 LAB — LIPID PANEL
Cholesterol: 184 mg/dL (ref 0–200)
HDL: 47 mg/dL (ref >40)
LDL Cholesterol: 129 mg/dL — ABNORMAL HIGH (ref 0–99)
Total CHOL/HDL Ratio: 3.9 RATIO
Triglycerides: 41 mg/dL (ref <150)
VLDL: 8 mg/dL (ref 0–40)

## 2019-08-05 LAB — BASIC METABOLIC PANEL
Anion gap: 9 (ref 5–15)
BUN: 12 mg/dL (ref 8–23)
CO2: 20 mmol/L — ABNORMAL LOW (ref 22–32)
Calcium: 8.8 mg/dL — ABNORMAL LOW (ref 8.9–10.3)
Chloride: 112 mmol/L — ABNORMAL HIGH (ref 98–111)
Creatinine, Ser: 0.81 mg/dL (ref 0.44–1.00)
GFR calc Af Amer: >60 mL/min (ref >60)
GFR calc non Af Amer: >60 mL/min (ref >60)
Glucose, Bld: 125 mg/dL — ABNORMAL HIGH (ref 70–99)
Potassium: 3.8 mmol/L (ref 3.5–5.1)
Sodium: 141 mmol/L (ref 135–145)

## 2019-08-05 LAB — CBC WITH DIFFERENTIAL/PLATELET
Abs Immature Granulocytes: 0.03 10*3/uL (ref 0.00–0.07)
Basophils Absolute: 0 10*3/uL (ref 0.0–0.1)
Basophils Relative: 0 %
Eosinophils Absolute: 0 10*3/uL (ref 0.0–0.5)
Eosinophils Relative: 0 %
HCT: 31.3 % — ABNORMAL LOW (ref 36.0–46.0)
Hemoglobin: 10.3 g/dL — ABNORMAL LOW (ref 12.0–15.0)
Immature Granulocytes: 0 %
Lymphocytes Relative: 18 %
Lymphs Abs: 1.5 10*3/uL (ref 0.7–4.0)
MCH: 27.5 pg (ref 26.0–34.0)
MCHC: 32.9 g/dL (ref 30.0–36.0)
MCV: 83.5 fL (ref 80.0–100.0)
Monocytes Absolute: 0.5 10*3/uL (ref 0.1–1.0)
Monocytes Relative: 5 %
Neutro Abs: 6.3 10*3/uL (ref 1.7–7.7)
Neutrophils Relative %: 77 %
Platelets: 247 10*3/uL (ref 150–400)
RBC: 3.75 MIL/uL — ABNORMAL LOW (ref 3.87–5.11)
RDW: 15.7 % — ABNORMAL HIGH (ref 11.5–15.5)
WBC: 8.3 10*3/uL (ref 4.0–10.5)
nRBC: 0 % (ref 0.0–0.2)

## 2019-08-05 LAB — GLUCOSE, CAPILLARY
Glucose-Capillary: 194 mg/dL — ABNORMAL HIGH (ref 70–99)
Glucose-Capillary: 88 mg/dL (ref 70–99)
Glucose-Capillary: 71 mg/dL (ref 70–99)
Glucose-Capillary: 107 mg/dL — ABNORMAL HIGH (ref 70–99)
Glucose-Capillary: 108 mg/dL — ABNORMAL HIGH (ref 70–99)
Glucose-Capillary: 139 mg/dL — ABNORMAL HIGH (ref 70–99)

## 2019-08-05 LAB — TRIGLYCERIDES: Triglycerides: 40 mg/dL (ref <150)

## 2019-08-05 LAB — ECHOCARDIOGRAM COMPLETE: Weight: 3555.58 oz

## 2019-08-05 LAB — MAGNESIUM: Magnesium: 1.9 mg/dL (ref 1.7–2.4)

## 2019-08-05 MED ORDER — ATORVASTATIN CALCIUM 40 MG PO TABS
40.0000 mg | ORAL_TABLET | Freq: Every day | ORAL | Status: DC
  Administered 2019-08-05 – 2019-08-06 (×2): 40 mg via ORAL
  Filled 2019-08-05 (×2): qty 1

## 2019-08-05 MED ORDER — LABETALOL HCL 5 MG/ML IV SOLN: 5.0000 mg | INTRAVENOUS | Status: DC | PRN

## 2019-08-05 MED ORDER — INSULIN ASPART 100 UNIT/ML ~~LOC~~ SOLN
0.0000 [IU] | Freq: Three times a day (TID) | SUBCUTANEOUS | Status: DC
  Administered 2019-08-06: 12:00:00 7 [IU] via SUBCUTANEOUS
  Administered 2019-08-06: 07:00:00 4 [IU] via SUBCUTANEOUS

## 2019-08-05 MED ORDER — ASPIRIN 325 MG PO TABS
325.0000 mg | ORAL_TABLET | Freq: Every day | ORAL | Status: DC
  Administered 2019-08-05 – 2019-08-06 (×2): 325 mg via ORAL
  Filled 2019-08-05 (×2): qty 1

## 2019-08-05 MED ORDER — CLOPIDOGREL BISULFATE 75 MG PO TABS
75.0000 mg | ORAL_TABLET | Freq: Every day | ORAL | Status: DC
  Administered 2019-08-05 – 2019-08-06 (×2): 75 mg via ORAL
  Filled 2019-08-05 (×2): qty 1

## 2019-08-05 MED ORDER — METOPROLOL TARTRATE 25 MG PO TABS
25.0000 mg | ORAL_TABLET | Freq: Two times a day (BID) | ORAL | Status: DC
  Administered 2019-08-05 – 2019-08-06 (×3): 25 mg via ORAL
  Filled 2019-08-05 (×3): qty 1

## 2019-08-05 MED ORDER — GABAPENTIN 400 MG PO CAPS
400.0000 mg | ORAL_CAPSULE | Freq: Three times a day (TID) | ORAL | Status: DC
  Administered 2019-08-05 – 2019-08-06 (×5): 400 mg via ORAL
  Filled 2019-08-05 (×5): qty 1

## 2019-08-05 MED ORDER — AMITRIPTYLINE HCL 25 MG PO TABS
50.0000 mg | ORAL_TABLET | Freq: Every day | ORAL | Status: DC
  Administered 2019-08-05: 21:00:00 50 mg via ORAL
  Filled 2019-08-05: qty 2

## 2019-08-05 MED ORDER — ALBUTEROL SULFATE (2.5 MG/3ML) 0.083% IN NEBU: 2.5000 mg | INHALATION_SOLUTION | Freq: Four times a day (QID) | RESPIRATORY_TRACT | Status: DC | PRN

## 2019-08-05 MED ORDER — PANTOPRAZOLE SODIUM 40 MG PO TBEC
40.0000 mg | DELAYED_RELEASE_TABLET | Freq: Every day | ORAL | Status: DC
  Administered 2019-08-05 – 2019-08-06 (×2): 40 mg via ORAL
  Filled 2019-08-05 (×2): qty 1

## 2019-08-05 MED ORDER — ASPIRIN 300 MG RE SUPP: 300.0000 mg | Freq: Every day | RECTAL | Status: DC

## 2019-08-05 NOTE — Progress Notes (Signed)
[facility] Progress Note Patient Name: Kellina Donovan DOB: 1940-06-28 MRN: [id number]   Date of Service  08/05/2019  HPI/Events of Note  Pt needs KUB to r/o foreign body prior to MRI exam  eICU Interventions  KUB ordered.        Kerry Kass Sheryl Towell 08/05/2019, 1:36 AM

## 2019-08-05 NOTE — Evaluation (Signed)
Occupational Therapy Evaluation Patient Details Name: Jean Watts MRN: [id number] DOB: Sep 18, 1940 Today's Date: 08/05/2019    History of Present Illness Jean Watts is a 79 y.o. female with past medical history significant for hypertension, diabetes mellitus, hyperlipidemia, vision loss R eye, neuropathy presented to emergency department after her daughter found her aphasic and weak on the right side upon waking up this morning.  MRI showing multiple small acute infarcts on the Left side.   Clinical Impression   Pt PTA: living with family and reports supportive daughter at home who does not work. Pt was mostly independent with ADL and mobility with SPC. Pt is not left alone and family performs IADL tasks for pt. 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 Pt currently minguardA to minA for stability to mobility and modA overall for ADL. Pt would benefit from continued OT skilled services for ADL, mobility and safety in [place] setting. OT following acutely.    Follow Up Recommendations  Home health OT;Supervision - Intermittent    Equipment Recommendations  3 in 1 bedside commode    Recommendations for Other Services       Precautions / Restrictions Precautions Precautions: Fall Restrictions Weight Bearing Restrictions: No      Mobility Bed Mobility Overal bed mobility: Needs Assistance Bed Mobility: Supine to Sit     Supine to sit: Min guard     General bed mobility comments: pt slow to bridge to EOB then impulsively used momentum to throw herselp up and forward and stand before therapist could guard her.  Transfers Overall transfer level: Needs assistance   Transfers: Sit to/from Stand Sit to Stand: Min guard              Balance Overall balance assessment: Needs assistance   Sitting balance-Leahy Scale: Good Sitting balance - Comments: could reach forward and down toward feet   Standing balance support: No upper extremity supported Standing balance-Leahy  Scale: Fair Standing balance comment: stood with eyes closed for about 10 secs before she lost balance posteriorly.  Much more steady with eyes open                           ADL either performed or assessed with clinical judgement   ADL Overall ADL's : Needs assistance/impaired Eating/Feeding: Set up;Sitting   Grooming: Set up;Sitting   Upper Body Bathing: Set up;Sitting   Lower Body Bathing: Minimal assistance;Sitting/lateral leans;Sit to/from stand   Upper Body Dressing : Minimal assistance;Sitting   Lower Body Dressing: Minimal assistance;Cueing for safety;Sitting/lateral leans;Sit to/from stand   Toilet Transfer: Minimal assistance;Cueing for safety   Toileting- Clothing Manipulation and Hygiene: Minimal assistance;Cueing for safety;Sitting/lateral lean;Sit to/from stand       Functional mobility during ADLs: Min guard;Minimal assistance;Rolling walker;Cueing for safety;Cueing for sequencing General ADL Comments: Pt limited by decreased activity tolerance, decreased mobility and decreased ability to self care.     Vision Baseline Vision/History: (R eye blindness) Patient Visual Report: No change from baseline Vision Assessment?: Vision impaired- to be further tested in functional context;Yes Additional Comments: R eye blindness     Perception     Praxis      Pertinent Vitals/Pain Pain Assessment: No/denies pain     Hand Dominance Left   Extremity/Trunk Assessment Upper Extremity Assessment Upper Extremity Assessment: Generalized weakness;RUE deficits/detail;LUE deficits/detail RUE Deficits / Details: 3+/5 MM grade LUE Deficits / Details: 3+/5 MM grade   Lower Extremity Assessment Lower Extremity Assessment: Generalized weakness  Cervical / Trunk Assessment Cervical / Trunk Assessment: Normal   Communication Communication Communication: Expressive difficulties   Cognition Arousal/Alertness: Awake/alert Behavior During Therapy: WFL for tasks  assessed/performed Overall Cognitive Status: Impaired/Different from baseline Area of Impairment: Problem solving;Memory                     Memory: Decreased short-term memory       Problem Solving: Slow processing;Decreased initiation;Difficulty sequencing     General Comments  VSS on RA    Exercises     Shoulder Instructions      Home Living Family/patient expects to be discharged to:: Private residence Living Arrangements: Children;Spouse/significant other(daughter) Available Help at Discharge: Family;Available 24 hours/day Type of Home: Apartment Home Access: Level entry     Home Layout: One level     Bathroom Shower/Tub: Teacher, early years/pre: Standard     Home Equipment: Cane - quad;Cane - single point          Prior Functioning/Environment Level of Independence: Independent with assistive device(s);Needs assistance  Gait / Transfers Assistance Needed: SPC for mobility; quad cane - occasionally; no driving ADL's / Homemaking Assistance Needed: independent with ADL/IADL; faily provides 24/7   Comments: out in community for MD appts mostly; occasional grocery store trip        OT Problem List: Decreased strength;Decreased activity tolerance;Impaired balance (sitting and/or standing);Decreased coordination;Decreased safety awareness;Pain;Impaired vision/perception      OT Treatment/Interventions: Self-care/ADL training;Therapeutic exercise;Energy conservation;DME and/or AE instruction;Therapeutic activities;Cognitive remediation/compensation;Patient/family education;Balance training;Visual/perceptual remediation/compensation    OT Goals(Current goals can be found in the care plan section) Acute Rehab OT Goals Patient Stated Goal: get home soon OT Goal Formulation: With patient Time For Goal Achievement: 08/19/19 Potential to Achieve Goals: Good ADL Goals Pt Will Perform Grooming: with supervision;standing Pt Will Perform Lower Body  Dressing: with min guard assist;sit to/from stand Pt Will Transfer to Toilet: with min guard assist;ambulating Pt Will Perform Toileting - Clothing Manipulation and hygiene: with min guard assist;sitting/lateral leans;sit to/from stand Additional ADL Goal #1: Pt performing OOB ADL tasks with supervisionA.  OT Frequency: Min 2X/week   Barriers to D/C:            Co-evaluation PT/OT/SLP Co-Evaluation/Treatment: Yes Reason for Co-Treatment: Complexity of the patient's impairments (multi-system involvement);To address functional/ADL transfers PT goals addressed during session: Mobility/safety with mobility OT goals addressed during session: ADL's and self-care      AM-PAC OT "6 Clicks" Daily Activity     Outcome Measure Help from another person eating meals?: None Help from another person taking care of personal grooming?: A Little Help from another person toileting, which includes using toliet, bedpan, or urinal?: A Little Help from another person bathing (including washing, rinsing, drying)?: A Little Help from another person to put on and taking off regular upper body clothing?: A Little Help from another person to put on and taking off regular lower body clothing?: A Lot 6 Click Score: 18   End of Session Equipment Utilized During Treatment: Gait belt Nurse Communication: Mobility status  Activity Tolerance: Patient tolerated treatment well Patient left: in chair;with call bell/phone within reach;with chair alarm set  OT Visit Diagnosis: Unsteadiness on feet (R26.81);Muscle weakness (generalized) (M62.81)                Time: [id number] OT Time Calculation (min): 33 min Charges:  OT General Charges $OT Visit: 1 Visit OT Evaluation $OT Eval Moderate Complexity: 1 Mod  Jefferey Pica, OTR/L Acute Rehabilitation Services  Pager: [phone] Office: [place] 08/05/2019, 5:42 PM

## 2019-08-05 NOTE — Progress Notes (Signed)
  Echocardiogram 2D Echocardiogram has been performed.  Jean Watts 08/05/2019, 10:18 AM

## 2019-08-05 NOTE — Progress Notes (Signed)
NAMEKaala Watts, MRN:  [id number], DOB:  1941/05/31, LOS: 1 ADMISSION DATE:  08/04/2019, CONSULTATION DATE:  08/04/2019 REFERRING MD:  Dr. Estanislado Pandy, CHIEF COMPLAINT:  CVA  Brief History   60 yoF presenting with aphasia and right sided weakness found to have acute left MCA occlusion.  Out of window for tPA.  Transferred from [facility] for Neuro IR s/p mechanical thrombectomy.  Returns to ICU post procedure on MV, PCCM consulted for vent management.     Past Medical History  HTN, HLD, DM, and right eye vision loss  Significant Hospital Events   2/9 tx [facility] -> [facility], NIR   Consults:  Neuro IR PCCM  Procedures:  2/9 ETT >> 2/10 2/9 Left radial aline >> 2/10  2/9 Cerebral angiography >> mechanical thrombectomy of Lt MCA M1 with complete revascularization achieving a TICI 3 revascularization  Significant Diagnostic Tests:  2/9 CTH >> 1. Acute nonhemorrhagic infarcts involving the posterior left insular ribbon and posterior super ganglionic left frontal lobe. 2. Asymmetric hyperdense left M1 segment suggesting thrombus. 3. Advanced atrophy and diffuse white matter disease likely reflects the sequela of chronic microvascular ischemia. 4. Ischemic changes of the thalami bilaterally are likely remote. 5. ASPECTS is 7/10  2/9 CTH/ cervical/ perfusion >> 1. 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 2. Left MCA branch vessels fill via collateral vessels. 3. Significant left MCA territory ischemia. T-max greater than 6 seconds is measured at 38 mL. 4. No significant core infarct. 5. Dense atherosclerotic changes within the cavernous internal carotid arteries bilaterally without focal stenosis. 6. Moderate tortuosity of the cervical internal carotid arteries bilaterally, likely secondary to hypertension. 7. Minimal atherosclerotic changes at the carotid bifurcations bilaterally without significant stenosis.  Micro Data:  2/9 SARS2/ Flu A/B >> neg 2/9 MRSA  PCR >> POS  Antimicrobials:  2/9 cefazolin pre op  Interim history/subjective:   Critically ill, intubated Afebrile Good urine output  Objective   Blood pressure 140/60, pulse 74, temperature 97.9 F (36.6 C), temperature source Axillary, resp. rate 14, weight 100.8 kg, SpO2 100 %.    Vent Mode: PRVC FiO2 (%):  [40 %-100 %] 40 % Set Rate:  [18 bmp] 18 bmp Vt Set:  [430 mL] 430 mL PEEP:  [5 cmH20] 5 cmH20 Plateau Pressure:  [13 cmH20-18 cmH20] 13 cmH20   Intake/Output Summary (Last 24 hours) at 08/05/2019 0841 Last data filed at 08/05/2019 0800 Gross per 24 hour  Intake 1542.39 ml  Output 2230 ml  Net -687.61 ml   Filed Weights   08/04/19 1920  Weight: 100.8 kg    Examination: General: Elderly woman, on vent, no distress HEENT: MM pink/moist, 7.5 ETT at 22 at lip, left pupil 3/ reactive, right opaque  Neuro: Alert, interactive, motioning for tube to be taken out, power 5/5 on left, good grip on right 4+/5, can move right leg against gravity CV: RR. 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 with complete revascularization  P:   cleviprex for SBP goal 120-140 Further stroke workup per Neuro PT eval  Acute respiratory insufficiency post procedure  P:  Spontaneous  breathing trials, rapidly weaned at bedside, tolerates pressure support 5/5, proceed with extubation DC all sedation   Hx HTN, HLD P:  cleviprex for SBP goal 120-140 Resume home lopressor, norvasc- benazepril  DM P:  SSI, CBG q 4 Hold home metformin  NAG acidosis-DC normal  saline    Best practice:  Diet: NPO Pain/Anxiety/Delirium protocol (if indicated): propofol/ prn fentanyl VAP protocol (if indicated): yes DVT prophylaxis: SCDs GI prophylaxis: PPI Glucose control: SSI Mobility: BR Code Status: Full  Family Communication: pending Disposition: Neuro ICU   The patient is critically ill with multiple organ systems failure and requires high complexity decision making for assessment and support, frequent evaluation and titration of therapies, application of advanced monitoring technologies and extensive interpretation of multiple databases. Critical Care Time devoted to patient care services described in this note independent of APP/resident  time is 32 minutes.   Kara Mead MD. [place] Flood. [facility]  If no response to pager , please call [phone]   08/05/2019

## 2019-08-05 NOTE — Procedures (Signed)
Extubation Procedure Note  Patient Details:   Name: Jean Watts DOB: 12/24/1940 MRN: [id number]   Airway Documentation:    Vent end date: 08/05/19 Vent end time: 0815   Evaluation  O2 sats: stable throughout Complications: No apparent complications Patient did tolerate procedure well. Bilateral Breath Sounds: (P) Clear, Diminished   Yes   Patient extubated to 2L nasal cannula per MD order.  Positive cuff leak noted.  Patient able to speak post extubation.  Sats and vitals are stable.  No stridor noted.  No complications noted at this time.  Judith Part 08/05/2019, 8:47 AM

## 2019-08-05 NOTE — Progress Notes (Signed)
Pt transported to and from MRI without event. °

## 2019-08-05 NOTE — Progress Notes (Signed)
Lower extremity venous has been completed.   Preliminary results in CV Proc.   Abram Sander 08/05/2019 4:02 PM

## 2019-08-05 NOTE — Evaluation (Signed)
Physical Therapy Evaluation Patient Details Name: Fardowsa Sandbothe MRN: [id number] DOB: 1940-12-30 Today's Date: 08/05/2019   History of Present Illness  Robinn Lovecchio is a 79 y.o. female with past medical history significant for hypertension, diabetes mellitus, hyperlipidemia, vision loss R eye, neuropathy presented to emergency department after her daughter found her aphasic and weak on the right side upon waking up this morning.  MRI showing multiple small acute infarcts on the Left side.  Clinical Impression  Pt admitted with/for signs of stroke.  Pt generally at a min guard level, with mild coordination issues and impulsiveness.  Pt currently limited functionally due to the problems listed below.  (see problems list.)  Pt will benefit from PT to maximize function and safety to be able to get home safely with available assist.     Follow Up Recommendations Home health PT;Supervision/Assistance - 24 hour    Equipment Recommendations  None recommended by PT    Recommendations for Other Services       Precautions / Restrictions Precautions Precautions: Fall Restrictions Weight Bearing Restrictions: No      Mobility  Bed Mobility Overal bed mobility: Needs Assistance Bed Mobility: Supine to Sit     Supine to sit: Min guard     General bed mobility comments: pt slow to bridge to EOB then impulsively used momentum to throw herselp up and forward and stand before therapist could guard her.  Transfers Overall transfer level: Needs assistance   Transfers: Sit to/from Stand Sit to Stand: Min guard            Ambulation/Gait Ambulation/Gait assistance: Min guard Gait Distance (Feet): 250 Feet Assistive device: None Gait Pattern/deviations: Step-through pattern Gait velocity: moderate   General Gait Details: no assist needed, but pt mildly unsteady at times when scanning or when asked to abruptly change direction.  Stairs            Wheelchair Mobility     Modified Rankin (Stroke Patients Only) Modified Rankin (Stroke Patients Only) Pre-Morbid Rankin Score: No significant disability Modified Rankin: Moderate disability     Balance Overall balance assessment: Needs assistance   Sitting balance-Leahy Scale: Good Sitting balance - Comments: could reach forward and down toward feet   Standing balance support: No upper extremity supported Standing balance-Leahy Scale: Fair Standing balance comment: stood with eyes closed for about 10 secs before she lost balance posteriorly.  Much more steady with eyes open                             Pertinent Vitals/Pain      Home Living Family/patient expects to be discharged to:: Private residence Living Arrangements: Children;Spouse/significant other(daughter) Available Help at Discharge: Family;Available 24 hours/day Type of Home: Apartment Home Access: Level entry     Home Layout: One level Home Equipment: Cane - quad;Cane - single point      Prior Function Level of Independence: Independent with assistive device(s);Needs assistance   Gait / Transfers Assistance Needed: SPC for mobility; quad cane - occasionally; no driving  ADL's / Homemaking Assistance Needed: independent with ADL/IADL; faily provides 24/7  Comments: out in community for MD appts mostly; occasional grocery store trip     Hand Dominance   Dominant Hand: Left    Extremity/Trunk Assessment        Lower Extremity Assessment Lower Extremity Assessment: Generalized weakness       Communication   Communication: Expressive difficulties  Cognition Arousal/Alertness: Awake/alert Behavior  During Therapy: WFL for tasks assessed/performed Overall Cognitive Status: Impaired/Different from baseline Area of Impairment: Problem solving;Memory                     Memory: Decreased short-term memory       Problem Solving: Slow processing;Decreased initiation;Difficulty sequencing         General Comments      Exercises     Assessment/Plan    PT Assessment Patient needs continued PT services  PT Problem List Decreased strength;Decreased activity tolerance;Decreased balance;Decreased mobility;Decreased coordination       PT Treatment Interventions Gait training;Stair training;Functional mobility training;Therapeutic activities;Balance training;Neuromuscular re-education;Patient/family education    PT Goals (Current goals can be found in the Care Plan section)  Acute Rehab PT Goals Patient Stated Goal: get home soon PT Goal Formulation: With patient Time For Goal Achievement: 08/12/19 Potential to Achieve Goals: Good    Frequency Min 3X/week   Barriers to discharge        Co-evaluation PT/OT/SLP Co-Evaluation/Treatment: Yes Reason for Co-Treatment: To address functional/ADL transfers PT goals addressed during session: Mobility/safety with mobility         AM-PAC PT "6 Clicks" Mobility  Outcome Measure Help needed turning from your back to your side while in a flat bed without using bedrails?: A Little Help needed moving from lying on your back to sitting on the side of a flat bed without using bedrails?: A Little Help needed moving to and from a bed to a chair (including a wheelchair)?: A Little Help needed standing up from a chair using your arms (e.g., wheelchair or bedside chair)?: A Little Help needed to walk in hospital room?: A Little Help needed climbing 3-5 steps with a railing? : A Little 6 Click Score: 18    End of Session   Activity Tolerance: Patient tolerated treatment well Patient left: in chair;with call bell/phone within reach;with chair alarm set Nurse Communication: Mobility status PT Visit Diagnosis: Other abnormalities of gait and mobility (R26.89);Other symptoms and signs involving the nervous system (R29.898)    Time: [id number] PT Time Calculation (min) (ACUTE ONLY): 31 min   Charges:   PT Evaluation $PT Eval Moderate  Complexity: 1 Mod          08/05/2019  Ginger Carne., PT Acute Rehabilitation Services [phone]  (pager) [phone]  (office)  Tessie Fass Cason Luffman 08/05/2019, 1:55 PM

## 2019-08-05 NOTE — Progress Notes (Signed)
SLP Cancellation Note  Patient Details Name: Veroncia Boysel MRN: [id number] DOB: 08-08-1940   Cancelled treatment:       Reason Eval/Treat Not Completed: Medical issues which prohibited therapy (on vent this am). Will f/u as able.    Osie Bond., M.A. [place] [facility] Services Pager [phone] Office [phone]  08/05/2019, 7:24 AM

## 2019-08-05 NOTE — Progress Notes (Addendum)
Assisted tele visit to patient with  grandson.  Symeon Puleo M Arlind Klingerman, RN   

## 2019-08-05 NOTE — Progress Notes (Signed)
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 Estanislado Pandy. Patient awake and alert sitting in bed with no complaints at this time. Can spontaneously move all extremities. Right groin incision c/d/i.  MRI/MRA brain/head this AM: 1. 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 2. Punctate focus of acute ischemia within the subcortical left frontal lobe. 3. Normal intracranial MRA. 4. Chronic ischemic microangiopathy.   Allergies: Patient has no known allergies.  Medications: Prior to Admission medications   Medication Sig Start Date End Date Taking? Authorizing Provider  albuterol (PROVENTIL) (2.5 MG/3ML) 0.083% nebulizer solution Inhale 2.5 mg into the lungs every 6 (six) hours as needed for wheezing or shortness of breath.  01/29/19  Yes [provider]  amitriptyline (ELAVIL) 50 MG tablet Take 50 mg by mouth at bedtime.  02/17/19  Yes [provider]  amLODipine-benazepril (LOTREL) 5-10 MG capsule Take 1 capsule by mouth daily. 01/03/19  Yes [provider]  gabapentin (NEURONTIN) 400 MG capsule Take 400 mg by mouth 3 (three) times daily.  03/09/19  Yes [provider]  HUMULIN N 100 UNIT/ML injection Inject 20-40 Units into the skin See admin instructions. Inject 40 units into the skin in the morning and 20 units in the evening 02/23/19  Yes [provider]  meclizine (ANTIVERT) 25 MG tablet Take 25 mg by mouth at bedtime.  01/05/19  Yes [provider]  metFORMIN (GLUCOPHAGE) 1000 MG tablet Take 1,000 mg by mouth 2 (two) times daily.  03/09/19  Yes [provider]  metoprolol  tartrate (LOPRESSOR) 25 MG tablet Take 25 mg by mouth 2 (two) times daily.  03/09/19  Yes [provider]     Vital Signs: BP (!) 152/62   Pulse (!) 113   Temp 97.9 F (36.6 C) (Axillary)   Resp 18   Wt 222 lb 3.6 oz (100.8 kg)   SpO2 98%   BMI 38.14 kg/m   Physical Exam Vitals and nursing note reviewed.  Constitutional:      General: She is not in acute distress.    Appearance: Normal appearance.  Pulmonary:     Effort: Pulmonary effort is normal. No respiratory distress.  Skin:    General: Skin is warm and dry.     Comments: Right groin incision soft without active bleeding or hematoma.  Neurological:     Mental Status: She is alert.     Comments: Alert, awake, and oriented x3. Speech and comprehension intact. No facial asymmetry. Tongue midline. Can spontaneously move all extremities. Distal pulses 1+ bilaterally.     Imaging: CT Angio Head W or Wo Contrast  Result Date: 08/04/2019 CLINICAL DATA:  Right-sided weakness and facial droop. Expressive aphasia. Patient awoke with symptoms. Last seen normal at 11 o'clock p.m. last night EXAM: CT ANGIOGRAPHY HEAD AND NECK CT PERFUSION BRAIN TECHNIQUE: Multidetector CT imaging of the head and neck was performed using the standard protocol during bolus administration of intravenous contrast. Multiplanar CT image reconstructions and MIPs were obtained to evaluate the vascular anatomy. Carotid stenosis measurements (when applicable) are obtained utilizing NASCET criteria, using the distal internal carotid diameter as the denominator. Multiphase CT imaging of the brain was performed following IV bolus  contrast injection. Subsequent parametric perfusion maps were calculated using RAPID software. CONTRAST:  154mL OMNIPAQUE IOHEXOL 350 MG/ML SOLN COMPARISON:  CT head without contrast of the same day. FINDINGS: CTA NECK FINDINGS Aortic arch: A 3 vessel arch configuration is present. Atherosclerotic calcifications are present at the  arch. The proximal descending aorta is enlarged, measuring 3.4 cm. No significant stenosis is present at the great vessel origins. Right carotid system: The right common carotid artery is tortuous without significant stenosis. Minimal atherosclerotic changes are noted at the right carotid bifurcation. There is no significant stenosis. Moderate tortuosity is present in the more distal cervical right ICA without significant stenosis. Left carotid system: The left common carotid artery is tortuous without significant stenosis. Atherosclerotic changes are noted at the bifurcation. There is no significant stenosis. Moderate tortuosity is present in the cervical left ICA without significant stenosis. Vertebral arteries: The left vertebral artery is the dominant vessel. Both vertebral arteries originate from the subclavian arteries without significant stenosis. There is no significant stenosis of either vertebral artery in neck. Skeleton: Multilevel degenerative changes are present cervical spine. There is chronic loss of disc height at C5-6, C6-7, and C7-T1. Slight degenerative anterolisthesis is present at C2-3, C3-4, and C4-5. The patient is edentulous. No focal lytic or blastic lesions are present. Other neck: The soft tissues the neck demonstrate heterogeneous appearance of the thyroid without a dominant lesion. Salivary glands are within normal limits. No significant cervical adenopathy is present. Upper chest: Thoracic inlet is normal.  Lung apices are clear. Review of the MIP images confirms the above findings CTA HEAD FINDINGS Anterior circulation: Dense atherosclerotic calcifications are present throughout the cavernous internal carotid arteries bilaterally without a significant stenosis relative to the more distal vessels. The ICA termini are patent. A1 segments are normal. The anterior communicating artery is patent. The right M1 segment is normal. The left M1 is occluded at the bifurcation. Left MCA branch  vessels fill via collaterals. Right MCA and bilateral ACA branch vessels are unremarkable. Posterior circulation: The left vertebral artery is the dominant vessel. PICA origins are visualized and normal. Vertebrobasilar junction is normal. The basilar artery is within normal limits. Both posterior cerebral arteries originate from the basilar tip. There is some attenuation of distal PCA branch vessels. Asymmetric attenuation is present on the right. No significant proximal stenosis or occlusion is present. Venous sinuses: The dural sinuses are patent. The straight sinus and deep cerebral veins are intact. Cortical veins are unremarkable. Anatomic variants: None Review of the MIP images confirms the above findings CT Brain Perfusion Findings: ASPECTS: 7/10 CBF (<30%) Volume: 38mL Perfusion (Tmax>6.0s) volume: 46mL Mismatch Volume: 49mL Infarction Location:Left MCA territory including the insular cortex and posterior left frontal lobe IMPRESSION: 1. 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 2. Left MCA branch vessels fill via collateral vessels. 3. Significant left MCA territory ischemia. T-max greater than 6 seconds is measured at 38 mL. 4. No significant core infarct. 5. Dense atherosclerotic changes within the cavernous internal carotid arteries bilaterally without focal stenosis. 6. Moderate tortuosity of the cervical internal carotid arteries bilaterally, likely secondary to hypertension. 7. Minimal atherosclerotic changes at the carotid bifurcations bilaterally without significant stenosis. These results were called by telephone at the time of interpretation on 08/04/2019 at 3:05 pm to provider Veryl Speak , who verbally acknowledged these results. Electronically Signed   By: San Morelle M.D.   On: 08/04/2019 15:28   DG Abd 1 View  Result Date: 08/05/2019 CLINICAL DATA:  Assess for foreign body for MRI EXAM: ABDOMEN - 1 VIEW COMPARISON:  None. FINDINGS: Nonspecific gaseous  distention of the colon. No high-grade obstructive small bowel gas pattern is seen. No radiopaque foreign bodies are evident. No suspicious calcifications. Degenerative changes present in the spine and pelvis. IMPRESSION: No radiopaque foreign bodies are evident. Nonspecific gaseous distention of the colon. No high-grade obstructive small bowel gas pattern. Electronically Signed   By: Lovena Le M.D.   On: 08/05/2019 02:25   CT Angio Neck W and/or Wo Contrast  Result Date: 08/04/2019 CLINICAL DATA:  Right-sided weakness and facial droop. Expressive aphasia. Patient awoke with symptoms. Last seen normal at 11 o'clock p.m. last night EXAM: CT ANGIOGRAPHY HEAD AND NECK CT PERFUSION BRAIN TECHNIQUE: Multidetector CT imaging of the head and neck was performed using the standard protocol during bolus administration of intravenous contrast. Multiplanar CT image reconstructions and MIPs were obtained to evaluate the vascular anatomy. Carotid stenosis measurements (when applicable) are obtained utilizing NASCET criteria, using the distal internal carotid diameter as the denominator. Multiphase CT imaging of the brain was performed following IV bolus contrast injection. Subsequent parametric perfusion maps were calculated using RAPID software. CONTRAST:  172mL OMNIPAQUE IOHEXOL 350 MG/ML SOLN COMPARISON:  CT head without contrast of the same day. FINDINGS: CTA NECK FINDINGS Aortic arch: A 3 vessel arch configuration is present. Atherosclerotic calcifications are present at the arch. The proximal descending aorta is enlarged, measuring 3.4 cm. No significant stenosis is present at the great vessel origins. Right carotid system: The right common carotid artery is tortuous without significant stenosis. Minimal atherosclerotic changes are noted at the right carotid bifurcation. There is no significant stenosis. Moderate tortuosity is present in the more distal cervical right ICA without significant stenosis. Left carotid  system: The left common carotid artery is tortuous without significant stenosis. Atherosclerotic changes are noted at the bifurcation. There is no significant stenosis. Moderate tortuosity is present in the cervical left ICA without significant stenosis. Vertebral arteries: The left vertebral artery is the dominant vessel. Both vertebral arteries originate from the subclavian arteries without significant stenosis. There is no significant stenosis of either vertebral artery in neck. Skeleton: Multilevel degenerative changes are present cervical spine. There is chronic loss of disc height at C5-6, C6-7, and C7-T1. Slight degenerative anterolisthesis is present at C2-3, C3-4, and C4-5. The patient is edentulous. No focal lytic or blastic lesions are present. Other neck: The soft tissues the neck demonstrate heterogeneous appearance of the thyroid without a dominant lesion. Salivary glands are within normal limits. No significant cervical adenopathy is present. Upper chest: Thoracic inlet is normal.  Lung apices are clear. Review of the MIP images confirms the above findings CTA HEAD FINDINGS Anterior circulation: Dense atherosclerotic calcifications are present throughout the cavernous internal carotid arteries bilaterally without a significant stenosis relative to the more distal vessels. The ICA termini are patent. A1 segments are normal. The anterior communicating artery is patent. The right M1 segment is normal. The left M1 is occluded at the bifurcation. Left MCA branch vessels fill via collaterals. Right MCA and bilateral ACA branch vessels are unremarkable. Posterior circulation: The left vertebral artery is the dominant vessel. PICA origins are visualized and normal. Vertebrobasilar junction is normal. The basilar artery is within normal limits. Both posterior cerebral arteries originate from the basilar tip. There is some attenuation of distal PCA branch vessels. Asymmetric attenuation is present on the right.  No significant proximal stenosis or occlusion is present. Venous sinuses: The dural sinuses  are patent. The straight sinus and deep cerebral veins are intact. Cortical veins are unremarkable. Anatomic variants: None Review of the MIP images confirms the above findings CT Brain Perfusion Findings: ASPECTS: 7/10 CBF (<30%) Volume: 3mL Perfusion (Tmax>6.0s) volume: 53mL Mismatch Volume: 41mL Infarction Location:Left MCA territory including the insular cortex and posterior left frontal lobe IMPRESSION: 1. 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 2. Left MCA branch vessels fill via collateral vessels. 3. Significant left MCA territory ischemia. T-max greater than 6 seconds is measured at 38 mL. 4. No significant core infarct. 5. Dense atherosclerotic changes within the cavernous internal carotid arteries bilaterally without focal stenosis. 6. Moderate tortuosity of the cervical internal carotid arteries bilaterally, likely secondary to hypertension. 7. Minimal atherosclerotic changes at the carotid bifurcations bilaterally without significant stenosis. These results were called by telephone at the time of interpretation on 08/04/2019 at 3:05 pm to provider Veryl Speak , who verbally acknowledged these results. Electronically Signed   By: San Morelle M.D.   On: 08/04/2019 15:28   MR ANGIO HEAD WO CONTRAST  Result Date: 08/05/2019 CLINICAL DATA:  Right-sided weakness and aphasia EXAM: MRI HEAD WITHOUT CONTRAST MRA HEAD WITHOUT CONTRAST TECHNIQUE: Multiplanar, multiecho pulse sequences of the brain and surrounding structures were obtained without intravenous contrast. Angiographic images of the head were obtained using MRA technique without contrast. COMPARISON:  None. FINDINGS: MRI HEAD FINDINGS Brain: There is an area of abnormal diffusion restriction within the left insular ribbon and medial left temporal lobe. Punctate focus of diffusion restriction within the subcortical left  frontal lobe (series 9, image 79). No contralateral or infratentorial ischemia. Early confluent hyperintense T2-weighted signal of the periventricular and deep white matter, most commonly due to chronic ischemic microangiopathy. Mild generalized atrophy. Two chronic micro hemorrhagic foci in the left temporal lobe. Normal midline structures. Vascular: Normal flow voids. Skull and upper cervical spine: Normal marrow signal. Sinuses/Orbits: Right phthisis bulbi. Paranasal sinuses and mastoids are clear. Other: None MRA HEAD FINDINGS POSTERIOR CIRCULATION: --Vertebral arteries: Normal V4 segments. --Posterior inferior cerebellar arteries (PICA): Patent origins from the vertebral arteries. --Anterior inferior cerebellar arteries (AICA): Patent origins from the basilar artery. --Basilar artery: Normal. --Superior cerebellar arteries: Normal. --Posterior cerebral arteries: Normal. Both originate from the basilar artery. Posterior communicating arteries (p-comm) are diminutive or absent. ANTERIOR CIRCULATION: --Intracranial internal carotid arteries: Normal. --Anterior cerebral arteries (ACA): Normal. Both A1 segments are present. Patent anterior communicating artery (a-comm). --Middle cerebral arteries (MCA): Normal. IMPRESSION: 1. 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 2. Punctate focus of acute ischemia within the subcortical left frontal lobe. 3. Normal intracranial MRA. 4. Chronic ischemic microangiopathy. Electronically Signed   By: Ulyses Jarred M.D.   On: 08/05/2019 03:31   MR BRAIN WO CONTRAST  Result Date: 08/05/2019 CLINICAL DATA:  Right-sided weakness and aphasia EXAM: MRI HEAD WITHOUT CONTRAST MRA HEAD WITHOUT CONTRAST TECHNIQUE: Multiplanar, multiecho pulse sequences of the brain and surrounding structures were obtained without intravenous contrast. Angiographic images of the head were obtained using MRA technique without contrast. COMPARISON:  None. FINDINGS:  MRI HEAD FINDINGS Brain: There is an area of abnormal diffusion restriction within the left insular ribbon and medial left temporal lobe. Punctate focus of diffusion restriction within the subcortical left frontal lobe (series 9, image 79). No contralateral or infratentorial ischemia. Early confluent hyperintense T2-weighted signal of the periventricular and deep white matter, most commonly due to chronic ischemic microangiopathy. Mild generalized atrophy. Two chronic micro hemorrhagic  foci in the left temporal lobe. Normal midline structures. Vascular: Normal flow voids. Skull and upper cervical spine: Normal marrow signal. Sinuses/Orbits: Right phthisis bulbi. Paranasal sinuses and mastoids are clear. Other: None MRA HEAD FINDINGS POSTERIOR CIRCULATION: --Vertebral arteries: Normal V4 segments. --Posterior inferior cerebellar arteries (PICA): Patent origins from the vertebral arteries. --Anterior inferior cerebellar arteries (AICA): Patent origins from the basilar artery. --Basilar artery: Normal. --Superior cerebellar arteries: Normal. --Posterior cerebral arteries: Normal. Both originate from the basilar artery. Posterior communicating arteries (p-comm) are diminutive or absent. ANTERIOR CIRCULATION: --Intracranial internal carotid arteries: Normal. --Anterior cerebral arteries (ACA): Normal. Both A1 segments are present. Patent anterior communicating artery (a-comm). --Middle cerebral arteries (MCA): Normal. IMPRESSION: 1. 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 2. Punctate focus of acute ischemia within the subcortical left frontal lobe. 3. Normal intracranial MRA. 4. Chronic ischemic microangiopathy. Electronically Signed   By: Ulyses Jarred M.D.   On: 08/05/2019 03:31   CT CEREBRAL PERFUSION W CONTRAST  Result Date: 08/04/2019 CLINICAL DATA:  Right-sided weakness and facial droop. Expressive aphasia. Patient awoke with symptoms. Last seen normal at 11 o'clock  p.m. last night EXAM: CT ANGIOGRAPHY HEAD AND NECK CT PERFUSION BRAIN TECHNIQUE: Multidetector CT imaging of the head and neck was performed using the standard protocol during bolus administration of intravenous contrast. Multiplanar CT image reconstructions and MIPs were obtained to evaluate the vascular anatomy. Carotid stenosis measurements (when applicable) are obtained utilizing NASCET criteria, using the distal internal carotid diameter as the denominator. Multiphase CT imaging of the brain was performed following IV bolus contrast injection. Subsequent parametric perfusion maps were calculated using RAPID software. CONTRAST:  137mL OMNIPAQUE IOHEXOL 350 MG/ML SOLN COMPARISON:  CT head without contrast of the same day. FINDINGS: CTA NECK FINDINGS Aortic arch: A 3 vessel arch configuration is present. Atherosclerotic calcifications are present at the arch. The proximal descending aorta is enlarged, measuring 3.4 cm. No significant stenosis is present at the great vessel origins. Right carotid system: The right common carotid artery is tortuous without significant stenosis. Minimal atherosclerotic changes are noted at the right carotid bifurcation. There is no significant stenosis. Moderate tortuosity is present in the more distal cervical right ICA without significant stenosis. Left carotid system: The left common carotid artery is tortuous without significant stenosis. Atherosclerotic changes are noted at the bifurcation. There is no significant stenosis. Moderate tortuosity is present in the cervical left ICA without significant stenosis. Vertebral arteries: The left vertebral artery is the dominant vessel. Both vertebral arteries originate from the subclavian arteries without significant stenosis. There is no significant stenosis of either vertebral artery in neck. Skeleton: Multilevel degenerative changes are present cervical spine. There is chronic loss of disc height at C5-6, C6-7, and C7-T1. Slight  degenerative anterolisthesis is present at C2-3, C3-4, and C4-5. The patient is edentulous. No focal lytic or blastic lesions are present. Other neck: The soft tissues the neck demonstrate heterogeneous appearance of the thyroid without a dominant lesion. Salivary glands are within normal limits. No significant cervical adenopathy is present. Upper chest: Thoracic inlet is normal.  Lung apices are clear. Review of the MIP images confirms the above findings CTA HEAD FINDINGS Anterior circulation: Dense atherosclerotic calcifications are present throughout the cavernous internal carotid arteries bilaterally without a significant stenosis relative to the more distal vessels. The ICA termini are patent. A1 segments are normal. The anterior communicating artery is patent. The right M1 segment is normal. The left M1 is occluded at  the bifurcation. Left MCA branch vessels fill via collaterals. Right MCA and bilateral ACA branch vessels are unremarkable. Posterior circulation: The left vertebral artery is the dominant vessel. PICA origins are visualized and normal. Vertebrobasilar junction is normal. The basilar artery is within normal limits. Both posterior cerebral arteries originate from the basilar tip. There is some attenuation of distal PCA branch vessels. Asymmetric attenuation is present on the right. No significant proximal stenosis or occlusion is present. Venous sinuses: The dural sinuses are patent. The straight sinus and deep cerebral veins are intact. Cortical veins are unremarkable. Anatomic variants: None Review of the MIP images confirms the above findings CT Brain Perfusion Findings: ASPECTS: 7/10 CBF (<30%) Volume: 77mL Perfusion (Tmax>6.0s) volume: 48mL Mismatch Volume: 88mL Infarction Location:Left MCA territory including the insular cortex and posterior left frontal lobe IMPRESSION: 1. 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 2. Left MCA branch vessels fill via collateral  vessels. 3. Significant left MCA territory ischemia. T-max greater than 6 seconds is measured at 38 mL. 4. No significant core infarct. 5. Dense atherosclerotic changes within the cavernous internal carotid arteries bilaterally without focal stenosis. 6. Moderate tortuosity of the cervical internal carotid arteries bilaterally, likely secondary to hypertension. 7. Minimal atherosclerotic changes at the carotid bifurcations bilaterally without significant stenosis. These results were called by telephone at the time of interpretation on 08/04/2019 at 3:05 pm to provider Veryl Speak , who verbally acknowledged these results. Electronically Signed   By: San Morelle M.D.   On: 08/04/2019 15:28   DG Chest Port 1 View  Result Date: 08/04/2019 CLINICAL DATA:  Intubation EXAM: PORTABLE CHEST 1 VIEW COMPARISON:  None. FINDINGS: Endotracheal tube tip is at the level of the clavicular heads. Mild cardiomegaly with bihilar prominence. No focal airspace consolidation or pulmonary edema. No pleural effusion. IMPRESSION: Endotracheal tube tip at the level of the clavicular heads. Electronically Signed   By: Ulyses Jarred M.D.   On: 08/04/2019 19:21   CT HEAD CODE STROKE WO CONTRAST  Result Date: 08/04/2019 CLINICAL DATA:  Code stroke. Wake up stroke. Patient awoke with expressive aphasia. Unable to identify objects. Right-sided arm weakness and lip droop. EXAM: CT HEAD WITHOUT CONTRAST TECHNIQUE: Contiguous axial images were obtained from the base of the skull through the vertex without intravenous contrast. COMPARISON:  None. FINDINGS: Brain: There is diffuse loss of gray-white differentiation the posterior aspect of the left insular ribbon. Focal nonhemorrhagic infarct is also present in the posterior left frontal lobe, best seen on image 20 of series 2. Moderate diffuse periventricular and subcortical white matter hypoattenuation is present bilaterally. No other focal cortical abnormalities are present. The basal  ganglia are intact. There is of ischemia are noted within the thalami bilaterally, likely remote. The brainstem is normal. Moderate cerebellar atrophy is noted. The ventricles are proportionate to the degree of atrophy. No significant extraaxial fluid collection is present. Vascular: Dense atherosclerotic calcifications are present within the cavernous internal carotid arteries bilaterally. Asymmetric hyperdense left M1 segment is noted. This extends to the bifurcation. Vascular calcifications are noted at the dural margin of the vertebral arteries bilaterally. Skull: Calvarium is intact. No focal lytic or blastic lesions are present. Sinuses/Orbits: The paranasal sinuses and mastoid air cells are clear. Calcified right globe is noted. The left globe and orbit are normal. ASPECTS [facility] Stroke Program Early CT Score) - Ganglionic level infarction (caudate, lentiform nuclei, internal capsule, insula, M1-M3 cortex): 5/7 - Supraganglionic infarction (M4-M6 cortex): 1/3 Total score (0-10 with 10 being  normal): 7/10 IMPRESSION: 1. Acute nonhemorrhagic infarcts involving the posterior left insular ribbon and posterior super ganglionic left frontal lobe. 2. Asymmetric hyperdense left M1 segment suggesting thrombus. 3. Advanced atrophy and diffuse white matter disease likely reflects the sequela of chronic microvascular ischemia. 4. Ischemic changes of the thalami bilaterally are likely remote. 5. ASPECTS is 7/10 If patient was last seen well within the last 24 hours, CT perfusion may be useful for further evaluation of brain ischemia versus infarct. These results were called by telephone at the time of interpretation on 08/04/2019 at 2:08 pm to provider Veryl Speak , who verbally acknowledged these results. Electronically Signed   By: San Morelle M.D.   On: 08/04/2019 14:12    Labs:  CBC: Recent Labs    08/04/19 1346 08/04/19 1931 08/05/19 0500  WBC 4.2  --  8.3  HGB 11.5* 11.6* 10.3*  HCT 36.1 34.0*  31.3*  PLT 253  --  247    COAGS: Recent Labs    08/04/19 1346  INR 1.0  APTT 29    BMP: Recent Labs    06/02/19 1112 08/04/19 1346 08/04/19 1931 08/05/19 0500  NA 139 140 140 141  K 4.4 4.1 4.2 3.8  CL 106 107  --  112*  CO2 24 25  --  20*  GLUCOSE 92 100*  --  125*  BUN 9 14  --  12  CALCIUM 9.1 9.1  --  8.8*  CREATININE 0.93 0.85  --  0.81  GFRNONAA 60* >60  --  >60  GFRAA >60 >60  --  >60    LIVER FUNCTION TESTS: Recent Labs    08/04/19 1346  BILITOT 0.4  AST 14*  ALT 12  ALKPHOS 111  PROT 7.2  ALBUMIN 3.9    Assessment and Plan:  Acute CVA s/p cerebral arteriogram with emergent mechanical thrombectomy of left MCA M1 segment proximal superior and inferior division occlusion achieving a TICI 3 revascularization 08/04/2019 by Dr. Estanislado Pandy. Patient's condition improved- can spontaneously move all extremities, no focal neurologic symptoms noted. Right groin stable, distal pulses 1+ bilaterally. Further plans per neurology/CCM- appreciate and agree with management. Please call NIR with questions/concerns.   Electronically Signed: Earley Abide, PA-C 08/05/2019, 8:53 AM   I spent a total of 25 Minutes at the the patient's bedside AND on the patient's hospital floor or unit, greater than 50% of which was counseling/coordinating care for left MCA M1 segment proximal superior and inferior division occlusion s/p revascularization.

## 2019-08-05 NOTE — Plan of Care (Signed)
  Problem: Education: Goal: Knowledge of disease or condition will improve 08/05/2019 0621 by Georgiana Shore, RN Outcome: Progressing 08/05/2019 0620 by Georgiana Shore, RN Outcome: Progressing Goal: Knowledge of secondary prevention will improve 08/05/2019 0621 by Georgiana Shore, RN Outcome: Progressing 08/05/2019 0620 by Georgiana Shore, RN Outcome: Progressing Goal: Knowledge of patient specific risk factors addressed and post discharge goals established will improve 08/05/2019 0621 by Georgiana Shore, RN Outcome: Progressing 08/05/2019 0620 by Georgiana Shore, RN Outcome: Progressing   Problem: Coping: Goal: Will verbalize positive feelings about self 08/05/2019 0621 by Georgiana Shore, RN Outcome: Progressing 08/05/2019 0620 by Georgiana Shore, RN Outcome: Progressing Goal: Will identify appropriate support needs 08/05/2019 0621 by Georgiana Shore, RN Outcome: Progressing 08/05/2019 0620 by Georgiana Shore, RN Outcome: Progressing   Problem: Coping: Goal: Will identify appropriate support needs 08/05/2019 0621 by Georgiana Shore, RN Outcome: Progressing 08/05/2019 0620 by Georgiana Shore, RN Outcome: Progressing   Problem: Coping: Goal: Will identify appropriate support needs 08/05/2019 0621 by Georgiana Shore, RN Outcome: Progressing 08/05/2019 0620 by Georgiana Shore, RN Outcome: Progressing   Problem: Health Behavior/Discharge Planning: Goal: Ability to manage health-related needs will improve 08/05/2019 0621 by Georgiana Shore, RN Outcome: Progressing 08/05/2019 0620 by Georgiana Shore, RN Outcome: Progressing   Problem: Self-Care: Goal: Ability to participate in self-care as condition permits will improve 08/05/2019 0621 by Georgiana Shore, RN Outcome: Progressing 08/05/2019 0620 by Georgiana Shore, RN Outcome: Progressing Goal: Verbalization of feelings and concerns over difficulty with self-care will improve 08/05/2019 0621 by Georgiana Shore, RN Outcome: Progressing 08/05/2019 0620 by Georgiana Shore,  RN Outcome: Progressing Goal: Ability to communicate needs accurately will improve 08/05/2019 0621 by Georgiana Shore, RN Outcome: Progressing 08/05/2019 0620 by Georgiana Shore, RN Outcome: Progressing   Problem: Nutrition: Goal: Risk of aspiration will decrease 08/05/2019 0621 by Georgiana Shore, RN Outcome: Progressing 08/05/2019 0620 by Georgiana Shore, RN Outcome: Progressing Goal: Dietary intake will improve 08/05/2019 0621 by Georgiana Shore, RN Outcome: Progressing 08/05/2019 0620 by Georgiana Shore, RN Outcome: Progressing   Problem: Ischemic Stroke/TIA Tissue Perfusion: Goal: Complications of ischemic stroke/TIA will be minimized 08/05/2019 0621 by Georgiana Shore, RN Outcome: Progressing 08/05/2019 0620 by Georgiana Shore, RN Outcome: Progressing

## 2019-08-05 NOTE — Progress Notes (Addendum)
STROKE TEAM PROGRESS NOTE   INTERVAL HISTORY Pt RN at bedside. She was extubated this am and has been doing well. Moving all extremities and following commands. Still has mild paraphasic errors. MRI minimal infarct at left insular cortex. Passed swallow.   Vitals:   08/05/19 0342 08/05/19 0400 08/05/19 0500 08/05/19 0600  BP:  131/62 129/68 133/62  Pulse:  75 78 90  Resp:  16 19 18   Temp:  97.9 F (36.6 C)    TempSrc:  Axillary    SpO2: 100% 100% 99% 100%  Weight:        CBC:  Recent Labs  Lab 08/04/19 1346 08/04/19 1346 08/04/19 1931 08/05/19 0500  WBC 4.2  --   --  8.3  NEUTROABS 2.3  2.5  --   --  6.3  HGB 11.5*   < > 11.6* 10.3*  HCT 36.1   < > 34.0* 31.3*  MCV 86.2  --   --  83.5  PLT 253  --   --  247   < > = values in this interval not displayed.    Basic Metabolic Panel:  Recent Labs  Lab 08/04/19 1346 08/04/19 1346 08/04/19 1931 08/05/19 0500  NA 140   < > 140 141  K 4.1   < > 4.2 3.8  CL 107  --   --  112*  CO2 25  --   --  20*  GLUCOSE 100*  --   --  125*  BUN 14  --   --  12  CREATININE 0.85  --   --  0.81  CALCIUM 9.1  --   --  8.8*  MG  --   --   --  1.9   < > = values in this interval not displayed.   Lipid Panel:     Component Value Date/Time   CHOL 184 08/05/2019 0500   TRIG 40 08/05/2019 0500   TRIG 41 08/05/2019 0500   HDL 47 08/05/2019 0500   CHOLHDL 3.9 08/05/2019 0500   VLDL 8 08/05/2019 0500   LDLCALC 129 (H) 08/05/2019 0500   HgbA1c: No results found for: HGBA1C Urine Drug Screen: No results found for: LABOPIA, COCAINSCRNUR, LABBENZ, AMPHETMU, THCU, LABBARB  Alcohol Level     Component Value Date/Time   ETH <10 08/04/2019 1346    IMAGING past 48 hours CT Angio Head W or Wo Contrast  Result Date: 08/04/2019 CLINICAL DATA:  Right-sided weakness and facial droop. Expressive aphasia. Patient awoke with symptoms. Last seen normal at 11 o'clock p.m. last night EXAM: CT ANGIOGRAPHY HEAD AND NECK CT PERFUSION BRAIN TECHNIQUE:  Multidetector CT imaging of the head and neck was performed using the standard protocol during bolus administration of intravenous contrast. Multiplanar CT image reconstructions and MIPs were obtained to evaluate the vascular anatomy. Carotid stenosis measurements (when applicable) are obtained utilizing NASCET criteria, using the distal internal carotid diameter as the denominator. Multiphase CT imaging of the brain was performed following IV bolus contrast injection. Subsequent parametric perfusion maps were calculated using RAPID software. CONTRAST:  164mL OMNIPAQUE IOHEXOL 350 MG/ML SOLN COMPARISON:  CT head without contrast of the same day. FINDINGS: CTA NECK FINDINGS Aortic arch: A 3 vessel arch configuration is present. Atherosclerotic calcifications are present at the arch. The proximal descending aorta is enlarged, measuring 3.4 cm. No significant stenosis is present at the great vessel origins. Right carotid system: The right common carotid artery is tortuous without significant stenosis. Minimal atherosclerotic changes are noted  at the right carotid bifurcation. There is no significant stenosis. Moderate tortuosity is present in the more distal cervical right ICA without significant stenosis. Left carotid system: The left common carotid artery is tortuous without significant stenosis. Atherosclerotic changes are noted at the bifurcation. There is no significant stenosis. Moderate tortuosity is present in the cervical left ICA without significant stenosis. Vertebral arteries: The left vertebral artery is the dominant vessel. Both vertebral arteries originate from the subclavian arteries without significant stenosis. There is no significant stenosis of either vertebral artery in neck. Skeleton: Multilevel degenerative changes are present cervical spine. There is chronic loss of disc height at C5-6, C6-7, and C7-T1. Slight degenerative anterolisthesis is present at C2-3, C3-4, and C4-5. The patient is  edentulous. No focal lytic or blastic lesions are present. Other neck: The soft tissues the neck demonstrate heterogeneous appearance of the thyroid without a dominant lesion. Salivary glands are within normal limits. No significant cervical adenopathy is present. Upper chest: Thoracic inlet is normal.  Lung apices are clear. Review of the MIP images confirms the above findings CTA HEAD FINDINGS Anterior circulation: Dense atherosclerotic calcifications are present throughout the cavernous internal carotid arteries bilaterally without a significant stenosis relative to the more distal vessels. The ICA termini are patent. A1 segments are normal. The anterior communicating artery is patent. The right M1 segment is normal. The left M1 is occluded at the bifurcation. Left MCA branch vessels fill via collaterals. Right MCA and bilateral ACA branch vessels are unremarkable. Posterior circulation: The left vertebral artery is the dominant vessel. PICA origins are visualized and normal. Vertebrobasilar junction is normal. The basilar artery is within normal limits. Both posterior cerebral arteries originate from the basilar tip. There is some attenuation of distal PCA branch vessels. Asymmetric attenuation is present on the right. No significant proximal stenosis or occlusion is present. Venous sinuses: The dural sinuses are patent. The straight sinus and deep cerebral veins are intact. Cortical veins are unremarkable. Anatomic variants: None Review of the MIP images confirms the above findings CT Brain Perfusion Findings: ASPECTS: 7/10 CBF (<30%) Volume: 38mL Perfusion (Tmax>6.0s) volume: 73mL Mismatch Volume: 50mL Infarction Location:Left MCA territory including the insular cortex and posterior left frontal lobe IMPRESSION: 1. 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 2. Left MCA branch vessels fill via collateral vessels. 3. Significant left MCA territory ischemia. T-max greater than 6 seconds is  measured at 38 mL. 4. No significant core infarct. 5. Dense atherosclerotic changes within the cavernous internal carotid arteries bilaterally without focal stenosis. 6. Moderate tortuosity of the cervical internal carotid arteries bilaterally, likely secondary to hypertension. 7. Minimal atherosclerotic changes at the carotid bifurcations bilaterally without significant stenosis. These results were called by telephone at the time of interpretation on 08/04/2019 at 3:05 pm to provider Veryl Speak , who verbally acknowledged these results. Electronically Signed   By: San Morelle M.D.   On: 08/04/2019 15:28   DG Abd 1 View  Result Date: 08/05/2019 CLINICAL DATA:  Assess for foreign body for MRI EXAM: ABDOMEN - 1 VIEW COMPARISON:  None. FINDINGS: Nonspecific gaseous distention of the colon. No high-grade obstructive small bowel gas pattern is seen. No radiopaque foreign bodies are evident. No suspicious calcifications. Degenerative changes present in the spine and pelvis. IMPRESSION: No radiopaque foreign bodies are evident. Nonspecific gaseous distention of the colon. No high-grade obstructive small bowel gas pattern. Electronically Signed   By: Lovena Le M.D.   On: 08/05/2019 02:25   CT Angio Neck  W and/or Wo Contrast  Result Date: 08/04/2019 CLINICAL DATA:  Right-sided weakness and facial droop. Expressive aphasia. Patient awoke with symptoms. Last seen normal at 11 o'clock p.m. last night EXAM: CT ANGIOGRAPHY HEAD AND NECK CT PERFUSION BRAIN TECHNIQUE: Multidetector CT imaging of the head and neck was performed using the standard protocol during bolus administration of intravenous contrast. Multiplanar CT image reconstructions and MIPs were obtained to evaluate the vascular anatomy. Carotid stenosis measurements (when applicable) are obtained utilizing NASCET criteria, using the distal internal carotid diameter as the denominator. Multiphase CT imaging of the brain was performed following IV bolus  contrast injection. Subsequent parametric perfusion maps were calculated using RAPID software. CONTRAST:  146mL OMNIPAQUE IOHEXOL 350 MG/ML SOLN COMPARISON:  CT head without contrast of the same day. FINDINGS: CTA NECK FINDINGS Aortic arch: A 3 vessel arch configuration is present. Atherosclerotic calcifications are present at the arch. The proximal descending aorta is enlarged, measuring 3.4 cm. No significant stenosis is present at the great vessel origins. Right carotid system: The right common carotid artery is tortuous without significant stenosis. Minimal atherosclerotic changes are noted at the right carotid bifurcation. There is no significant stenosis. Moderate tortuosity is present in the more distal cervical right ICA without significant stenosis. Left carotid system: The left common carotid artery is tortuous without significant stenosis. Atherosclerotic changes are noted at the bifurcation. There is no significant stenosis. Moderate tortuosity is present in the cervical left ICA without significant stenosis. Vertebral arteries: The left vertebral artery is the dominant vessel. Both vertebral arteries originate from the subclavian arteries without significant stenosis. There is no significant stenosis of either vertebral artery in neck. Skeleton: Multilevel degenerative changes are present cervical spine. There is chronic loss of disc height at C5-6, C6-7, and C7-T1. Slight degenerative anterolisthesis is present at C2-3, C3-4, and C4-5. The patient is edentulous. No focal lytic or blastic lesions are present. Other neck: The soft tissues the neck demonstrate heterogeneous appearance of the thyroid without a dominant lesion. Salivary glands are within normal limits. No significant cervical adenopathy is present. Upper chest: Thoracic inlet is normal.  Lung apices are clear. Review of the MIP images confirms the above findings CTA HEAD FINDINGS Anterior circulation: Dense atherosclerotic calcifications  are present throughout the cavernous internal carotid arteries bilaterally without a significant stenosis relative to the more distal vessels. The ICA termini are patent. A1 segments are normal. The anterior communicating artery is patent. The right M1 segment is normal. The left M1 is occluded at the bifurcation. Left MCA branch vessels fill via collaterals. Right MCA and bilateral ACA branch vessels are unremarkable. Posterior circulation: The left vertebral artery is the dominant vessel. PICA origins are visualized and normal. Vertebrobasilar junction is normal. The basilar artery is within normal limits. Both posterior cerebral arteries originate from the basilar tip. There is some attenuation of distal PCA branch vessels. Asymmetric attenuation is present on the right. No significant proximal stenosis or occlusion is present. Venous sinuses: The dural sinuses are patent. The straight sinus and deep cerebral veins are intact. Cortical veins are unremarkable. Anatomic variants: None Review of the MIP images confirms the above findings CT Brain Perfusion Findings: ASPECTS: 7/10 CBF (<30%) Volume: 81mL Perfusion (Tmax>6.0s) volume: 44mL Mismatch Volume: 60mL Infarction Location:Left MCA territory including the insular cortex and posterior left frontal lobe IMPRESSION: 1. 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 2. Left MCA branch vessels fill via collateral vessels. 3. Significant left MCA territory ischemia. T-max greater than 6  seconds is measured at 38 mL. 4. No significant core infarct. 5. Dense atherosclerotic changes within the cavernous internal carotid arteries bilaterally without focal stenosis. 6. Moderate tortuosity of the cervical internal carotid arteries bilaterally, likely secondary to hypertension. 7. Minimal atherosclerotic changes at the carotid bifurcations bilaterally without significant stenosis. These results were called by telephone at the time of interpretation on  08/04/2019 at 3:05 pm to provider Veryl Speak , who verbally acknowledged these results. Electronically Signed   By: San Morelle M.D.   On: 08/04/2019 15:28   MR ANGIO HEAD WO CONTRAST  Result Date: 08/05/2019 CLINICAL DATA:  Right-sided weakness and aphasia EXAM: MRI HEAD WITHOUT CONTRAST MRA HEAD WITHOUT CONTRAST TECHNIQUE: Multiplanar, multiecho pulse sequences of the brain and surrounding structures were obtained without intravenous contrast. Angiographic images of the head were obtained using MRA technique without contrast. COMPARISON:  None. FINDINGS: MRI HEAD FINDINGS Brain: There is an area of abnormal diffusion restriction within the left insular ribbon and medial left temporal lobe. Punctate focus of diffusion restriction within the subcortical left frontal lobe (series 9, image 79). No contralateral or infratentorial ischemia. Early confluent hyperintense T2-weighted signal of the periventricular and deep white matter, most commonly due to chronic ischemic microangiopathy. Mild generalized atrophy. Two chronic micro hemorrhagic foci in the left temporal lobe. Normal midline structures. Vascular: Normal flow voids. Skull and upper cervical spine: Normal marrow signal. Sinuses/Orbits: Right phthisis bulbi. Paranasal sinuses and mastoids are clear. Other: None MRA HEAD FINDINGS POSTERIOR CIRCULATION: --Vertebral arteries: Normal V4 segments. --Posterior inferior cerebellar arteries (PICA): Patent origins from the vertebral arteries. --Anterior inferior cerebellar arteries (AICA): Patent origins from the basilar artery. --Basilar artery: Normal. --Superior cerebellar arteries: Normal. --Posterior cerebral arteries: Normal. Both originate from the basilar artery. Posterior communicating arteries (p-comm) are diminutive or absent. ANTERIOR CIRCULATION: --Intracranial internal carotid arteries: Normal. --Anterior cerebral arteries (ACA): Normal. Both A1 segments are present. Patent anterior  communicating artery (a-comm). --Middle cerebral arteries (MCA): Normal. IMPRESSION: 1. 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 2. Punctate focus of acute ischemia within the subcortical left frontal lobe. 3. Normal intracranial MRA. 4. Chronic ischemic microangiopathy. Electronically Signed   By: Ulyses Jarred M.D.   On: 08/05/2019 03:31   MR BRAIN WO CONTRAST  Result Date: 08/05/2019 CLINICAL DATA:  Right-sided weakness and aphasia EXAM: MRI HEAD WITHOUT CONTRAST MRA HEAD WITHOUT CONTRAST TECHNIQUE: Multiplanar, multiecho pulse sequences of the brain and surrounding structures were obtained without intravenous contrast. Angiographic images of the head were obtained using MRA technique without contrast. COMPARISON:  None. FINDINGS: MRI HEAD FINDINGS Brain: There is an area of abnormal diffusion restriction within the left insular ribbon and medial left temporal lobe. Punctate focus of diffusion restriction within the subcortical left frontal lobe (series 9, image 79). No contralateral or infratentorial ischemia. Early confluent hyperintense T2-weighted signal of the periventricular and deep white matter, most commonly due to chronic ischemic microangiopathy. Mild generalized atrophy. Two chronic micro hemorrhagic foci in the left temporal lobe. Normal midline structures. Vascular: Normal flow voids. Skull and upper cervical spine: Normal marrow signal. Sinuses/Orbits: Right phthisis bulbi. Paranasal sinuses and mastoids are clear. Other: None MRA HEAD FINDINGS POSTERIOR CIRCULATION: --Vertebral arteries: Normal V4 segments. --Posterior inferior cerebellar arteries (PICA): Patent origins from the vertebral arteries. --Anterior inferior cerebellar arteries (AICA): Patent origins from the basilar artery. --Basilar artery: Normal. --Superior cerebellar arteries: Normal. --Posterior cerebral arteries: Normal. Both originate from the basilar artery. Posterior communicating  arteries (p-comm)  are diminutive or absent. ANTERIOR CIRCULATION: --Intracranial internal carotid arteries: Normal. --Anterior cerebral arteries (ACA): Normal. Both A1 segments are present. Patent anterior communicating artery (a-comm). --Middle cerebral arteries (MCA): Normal. IMPRESSION: 1. 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 2. Punctate focus of acute ischemia within the subcortical left frontal lobe. 3. Normal intracranial MRA. 4. Chronic ischemic microangiopathy. Electronically Signed   By: Ulyses Jarred M.D.   On: 08/05/2019 03:31   CT CEREBRAL PERFUSION W CONTRAST  Result Date: 08/04/2019 CLINICAL DATA:  Right-sided weakness and facial droop. Expressive aphasia. Patient awoke with symptoms. Last seen normal at 11 o'clock p.m. last night EXAM: CT ANGIOGRAPHY HEAD AND NECK CT PERFUSION BRAIN TECHNIQUE: Multidetector CT imaging of the head and neck was performed using the standard protocol during bolus administration of intravenous contrast. Multiplanar CT image reconstructions and MIPs were obtained to evaluate the vascular anatomy. Carotid stenosis measurements (when applicable) are obtained utilizing NASCET criteria, using the distal internal carotid diameter as the denominator. Multiphase CT imaging of the brain was performed following IV bolus contrast injection. Subsequent parametric perfusion maps were calculated using RAPID software. CONTRAST:  181mL OMNIPAQUE IOHEXOL 350 MG/ML SOLN COMPARISON:  CT head without contrast of the same day. FINDINGS: CTA NECK FINDINGS Aortic arch: A 3 vessel arch configuration is present. Atherosclerotic calcifications are present at the arch. The proximal descending aorta is enlarged, measuring 3.4 cm. No significant stenosis is present at the great vessel origins. Right carotid system: The right common carotid artery is tortuous without significant stenosis. Minimal atherosclerotic changes are noted at the right carotid  bifurcation. There is no significant stenosis. Moderate tortuosity is present in the more distal cervical right ICA without significant stenosis. Left carotid system: The left common carotid artery is tortuous without significant stenosis. Atherosclerotic changes are noted at the bifurcation. There is no significant stenosis. Moderate tortuosity is present in the cervical left ICA without significant stenosis. Vertebral arteries: The left vertebral artery is the dominant vessel. Both vertebral arteries originate from the subclavian arteries without significant stenosis. There is no significant stenosis of either vertebral artery in neck. Skeleton: Multilevel degenerative changes are present cervical spine. There is chronic loss of disc height at C5-6, C6-7, and C7-T1. Slight degenerative anterolisthesis is present at C2-3, C3-4, and C4-5. The patient is edentulous. No focal lytic or blastic lesions are present. Other neck: The soft tissues the neck demonstrate heterogeneous appearance of the thyroid without a dominant lesion. Salivary glands are within normal limits. No significant cervical adenopathy is present. Upper chest: Thoracic inlet is normal.  Lung apices are clear. Review of the MIP images confirms the above findings CTA HEAD FINDINGS Anterior circulation: Dense atherosclerotic calcifications are present throughout the cavernous internal carotid arteries bilaterally without a significant stenosis relative to the more distal vessels. The ICA termini are patent. A1 segments are normal. The anterior communicating artery is patent. The right M1 segment is normal. The left M1 is occluded at the bifurcation. Left MCA branch vessels fill via collaterals. Right MCA and bilateral ACA branch vessels are unremarkable. Posterior circulation: The left vertebral artery is the dominant vessel. PICA origins are visualized and normal. Vertebrobasilar junction is normal. The basilar artery is within normal limits. Both  posterior cerebral arteries originate from the basilar tip. There is some attenuation of distal PCA branch vessels. Asymmetric attenuation is present on the right. No significant proximal stenosis or occlusion is present. Venous sinuses: The dural sinuses are patent. The  straight sinus and deep cerebral veins are intact. Cortical veins are unremarkable. Anatomic variants: None Review of the MIP images confirms the above findings CT Brain Perfusion Findings: ASPECTS: 7/10 CBF (<30%) Volume: 39mL Perfusion (Tmax>6.0s) volume: 59mL Mismatch Volume: 24mL Infarction Location:Left MCA territory including the insular cortex and posterior left frontal lobe IMPRESSION: 1. 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 2. Left MCA branch vessels fill via collateral vessels. 3. Significant left MCA territory ischemia. T-max greater than 6 seconds is measured at 38 mL. 4. No significant core infarct. 5. Dense atherosclerotic changes within the cavernous internal carotid arteries bilaterally without focal stenosis. 6. Moderate tortuosity of the cervical internal carotid arteries bilaterally, likely secondary to hypertension. 7. Minimal atherosclerotic changes at the carotid bifurcations bilaterally without significant stenosis. These results were called by telephone at the time of interpretation on 08/04/2019 at 3:05 pm to provider Veryl Speak , who verbally acknowledged these results. Electronically Signed   By: San Morelle M.D.   On: 08/04/2019 15:28   DG Chest Port 1 View  Result Date: 08/04/2019 CLINICAL DATA:  Intubation EXAM: PORTABLE CHEST 1 VIEW COMPARISON:  None. FINDINGS: Endotracheal tube tip is at the level of the clavicular heads. Mild cardiomegaly with bihilar prominence. No focal airspace consolidation or pulmonary edema. No pleural effusion. IMPRESSION: Endotracheal tube tip at the level of the clavicular heads. Electronically Signed   By: Ulyses Jarred M.D.   On: 08/04/2019 19:21    CT HEAD CODE STROKE WO CONTRAST  Result Date: 08/04/2019 CLINICAL DATA:  Code stroke. Wake up stroke. Patient awoke with expressive aphasia. Unable to identify objects. Right-sided arm weakness and lip droop. EXAM: CT HEAD WITHOUT CONTRAST TECHNIQUE: Contiguous axial images were obtained from the base of the skull through the vertex without intravenous contrast. COMPARISON:  None. FINDINGS: Brain: There is diffuse loss of gray-white differentiation the posterior aspect of the left insular ribbon. Focal nonhemorrhagic infarct is also present in the posterior left frontal lobe, best seen on image 20 of series 2. Moderate diffuse periventricular and subcortical white matter hypoattenuation is present bilaterally. No other focal cortical abnormalities are present. The basal ganglia are intact. There is of ischemia are noted within the thalami bilaterally, likely remote. The brainstem is normal. Moderate cerebellar atrophy is noted. The ventricles are proportionate to the degree of atrophy. No significant extraaxial fluid collection is present. Vascular: Dense atherosclerotic calcifications are present within the cavernous internal carotid arteries bilaterally. Asymmetric hyperdense left M1 segment is noted. This extends to the bifurcation. Vascular calcifications are noted at the dural margin of the vertebral arteries bilaterally. Skull: Calvarium is intact. No focal lytic or blastic lesions are present. Sinuses/Orbits: The paranasal sinuses and mastoid air cells are clear. Calcified right globe is noted. The left globe and orbit are normal. ASPECTS [facility] Stroke Program Early CT Score) - Ganglionic level infarction (caudate, lentiform nuclei, internal capsule, insula, M1-M3 cortex): 5/7 - Supraganglionic infarction (M4-M6 cortex): 1/3 Total score (0-10 with 10 being normal): 7/10 IMPRESSION: 1. Acute nonhemorrhagic infarcts involving the posterior left insular ribbon and posterior super ganglionic left frontal  lobe. 2. Asymmetric hyperdense left M1 segment suggesting thrombus. 3. Advanced atrophy and diffuse white matter disease likely reflects the sequela of chronic microvascular ischemia. 4. Ischemic changes of the thalami bilaterally are likely remote. 5. ASPECTS is 7/10 If patient was last seen well within the last 24 hours, CT perfusion may be useful for further evaluation of brain ischemia versus infarct. These results were called  by telephone at the time of interpretation on 08/04/2019 at 2:08 pm to provider Veryl Speak , who verbally acknowledged these results. Electronically Signed   By: San Morelle M.D.   On: 08/04/2019 14:12    PHYSICAL EXAM  Temp:  [97.2 F (36.2 C)-98.5 F (36.9 C)] 97.2 F (36.2 C) (02/10 0800) Pulse Rate:  [41-113] 79 (02/10 1000) Resp:  [14-25] 19 (02/10 1000) BP: (96-152)/(50-118) 122/61 (02/10 1000) SpO2:  [95 %-100 %] 98 % (02/10 1000) Arterial Line BP: (89-166)/(42-69) 139/63 (02/10 0700) FiO2 (%):  [40 %-100 %] 40 % (02/10 0342) Weight:  [100.8 kg] 100.8 kg (02/09 1920)  General - Well nourished, well developed, in no apparent distress.  Ophthalmologic - fundi not visualized due to noncooperation.  Cardiovascular - Regular rhythm and rate.  Mental Status -  Level of arousal and orientation to time, place, and person were intact. Language exam showed intact comprehension, following commands, language fluent with intermittent paraphasic errors, able to repeat but with paraphasic errors on long or complex sentences, naming 4/5 Fund of Knowledge was assessed and was intact.  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 III, IV, VI - Extraocular movements intact, however right corneal whitening not able to see right pupil. V - Facial sensation intact bilaterally. VII - mild right facial labial fold flattening VIII - Hearing & vestibular intact bilaterally. X - Palate elevates  symmetrically. XI - Chin turning & shoulder shrug intact bilaterally. XII - Tongue protrusion intact.  Motor Strength - The patient's strength was normal in all extremities and pronator drift was absent.  Bulk was normal and fasciculations were absent.   Motor Tone - Muscle tone was assessed at the neck and appendages and was normal.  Reflexes - The patient's reflexes were symmetrical in all extremities and she had no pathological reflexes.  Sensory - Light touch, temperature/pinprick were assessed and were symmetrical.    Coordination - The patient had normal movements in the hands with no ataxia or dysmetria.  Tremor was absent.  Gait and Station - deferred.   ASSESSMENT/PLAN Ms. Kilynn Fiumara is a 79 y.o. female with history of hypertension, diabetes mellitus, hyperlipidemia, prior "stroke" in the right eye, neuropathy presenting to [facility] with aphasia and R sided weakness. Transferred to [facility] after hyperdense L M1 found for IR.   Stroke:   Small L MCA infarcts s/p IR w/ TICI3 revascularization L M1 occlusion, infarct embolic secondary to unknown source, suspicious for occult AF  Code Stroke CT head posterior L insular ribbon and posterior uper ganglionic L frontal lobe infarcts. Hyperdense L M1. Old ischemic changes B thalami. ASPECTS 7    CTA head & neck L MCA bifurcation occlusion with good collaterals. Dense B ICA cavernous atherosclerosis w/o focal stenosis; minimal atherosclerosis at bifurcaitons. Tortuous ICAs.   CT perfusion positive penumbra. No significant core.   Cerebral angio L M1 w/ proximal superior and inferior division occlusion w/ TICI3 revascularization   Post IR CT neg ICH or mass effect  MRI  Small infarct L insular ribbon and medial L temporal lobe. Punctate subcortical L frontal lobe infarct. Small vessel disease.   MRA  Left MCA patent post IR   LE Doppler  pending  2D Echo pending  Will consider loop recorder  if above work up unrevealing  LDL 129  HgbA1c pending   SCDs for VTE prophylaxis  No antithrombotic  prior to admission, now on ASA 325 and plavix 75.   Therapy recommendations:  pending   Disposition:  pending   Acute Respiratory Failure  Intubated for IR, unable to protect airway following  Extubated this am  Tolerating well  CCM signed off  Hypertension  Home meds:  Metoprolol 25 bid, amlodipine-benazepril 5-10  Stable on Cleviprex . BP goal < 180/105 . Long-term BP goal normotensive  Hyperlipidemia  Home meds:  No statin  Now on lipitor 40  LDL 129, goal < 70  Continue statin at discharge  Diabetes type II Uncontrolled Diabetic Polyneuropathy  Home meds:  Metformin 1000 bid, humulin 40 am, 20 pm  HgbA1c pending, goal < 7.0  CBG monitoring  SSI  Diabetic diet  Close PCP follow up   Dysphagia . Secondary to stroke . NPO . 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 This patient's care requires constant monitoring of vital signs, hemodynamics, respiratory and cardiac monitoring, review of multiple databases, neurological assessment, discussion with family, other specialists and medical decision making of high complexity. I spent 35 minutes of neurocritical care time in the care of this patient.  Rosalin Hawking, MD PhD Stroke Neurology 08/05/2019 11:02 AM  ADDENDUM: I had long discussion with daughter [name] over the phone,  updated pt current condition, treatment plan and potential prognosis, and answered all the questions. She expressed understanding and appreciation.   Rosalin Hawking, MD PhD Stroke Neurology 08/05/2019 6:26 PM  To contact Stroke Continuity provider, please refer to [email or web address] After hours, contact General Neurology

## 2019-08-05 NOTE — Plan of Care (Signed)
Pt able to moderately participate in ADLs. Pt has a pleasant, positive attitude about current condition. Pt verbalized understanding of plan of care.

## 2019-08-06 ENCOUNTER — Encounter (HOSPITAL_COMMUNITY)
Admission: EM | Disposition: A | Discharge status: Home Health | Payer: Self-pay | Source: Home / Self Care | Attending: Neurology

## 2019-08-06 ENCOUNTER — Inpatient Hospital Stay (HOSPITAL_COMMUNITY): Payer: 59

## 2019-08-06 DIAGNOSIS — I6389 Other cerebral infarction: Secondary | ICD-10-CM

## 2019-08-06 DIAGNOSIS — E1142 Type 2 diabetes mellitus with diabetic polyneuropathy: Secondary | ICD-10-CM

## 2019-08-06 DIAGNOSIS — H3411 Central retinal artery occlusion, right eye: Secondary | ICD-10-CM

## 2019-08-06 DIAGNOSIS — E669 Obesity, unspecified: Secondary | ICD-10-CM | POA: Diagnosis present

## 2019-08-06 DIAGNOSIS — E1165 Type 2 diabetes mellitus with hyperglycemia: Secondary | ICD-10-CM | POA: Diagnosis present

## 2019-08-06 DIAGNOSIS — H5440 Blindness, one eye, unspecified eye: Secondary | ICD-10-CM | POA: Diagnosis present

## 2019-08-06 DIAGNOSIS — I10 Essential (primary) hypertension: Secondary | ICD-10-CM | POA: Diagnosis present

## 2019-08-06 DIAGNOSIS — D72829 Elevated white blood cell count, unspecified: Secondary | ICD-10-CM

## 2019-08-06 DIAGNOSIS — G43909 Migraine, unspecified, not intractable, without status migrainosus: Secondary | ICD-10-CM | POA: Diagnosis present

## 2019-08-06 DIAGNOSIS — E785 Hyperlipidemia, unspecified: Secondary | ICD-10-CM | POA: Diagnosis present

## 2019-08-06 DIAGNOSIS — IMO0002 Reserved for concepts with insufficient information to code with codable children: Secondary | ICD-10-CM | POA: Diagnosis present

## 2019-08-06 HISTORY — PX: LOOP RECORDER INSERTION: EP1214

## 2019-08-06 LAB — CBC
HCT: 32.2 % — ABNORMAL LOW (ref 36.0–46.0)
HCT: 28.3 % — ABNORMAL LOW (ref 36.0–46.0)
Hemoglobin: 11.2 g/dL — ABNORMAL LOW (ref 12.0–15.0)
Hemoglobin: 9.4 g/dL — ABNORMAL LOW (ref 12.0–15.0)
MCH: 31.7 pg (ref 26.0–34.0)
MCH: 27.3 pg (ref 26.0–34.0)
MCHC: 34.8 g/dL (ref 30.0–36.0)
MCHC: 33.2 g/dL (ref 30.0–36.0)
MCV: 91.2 fL (ref 80.0–100.0)
MCV: 82.3 fL (ref 80.0–100.0)
Platelets: 381 10*3/uL (ref 150–400)
Platelets: 231 10*3/uL (ref 150–400)
RBC: 3.53 MIL/uL — ABNORMAL LOW (ref 3.87–5.11)
RBC: 3.44 MIL/uL — ABNORMAL LOW (ref 3.87–5.11)
RDW: 13.9 % (ref 11.5–15.5)
RDW: 15.6 % — ABNORMAL HIGH (ref 11.5–15.5)
WBC: 17.6 10*3/uL — ABNORMAL HIGH (ref 4.0–10.5)
WBC: 5.1 10*3/uL (ref 4.0–10.5)
nRBC: 0 % (ref 0.0–0.2)
nRBC: 0 % (ref 0.0–0.2)

## 2019-08-06 LAB — BASIC METABOLIC PANEL
Anion gap: 10 (ref 5–15)
BUN: 9 mg/dL (ref 8–23)
CO2: 20 mmol/L — ABNORMAL LOW (ref 22–32)
Calcium: 8.7 mg/dL — ABNORMAL LOW (ref 8.9–10.3)
Chloride: 108 mmol/L (ref 98–111)
Creatinine, Ser: 0.83 mg/dL (ref 0.44–1.00)
GFR calc Af Amer: >60 mL/min (ref >60)
GFR calc non Af Amer: >60 mL/min (ref >60)
Glucose, Bld: 177 mg/dL — ABNORMAL HIGH (ref 70–99)
Potassium: 4.4 mmol/L (ref 3.5–5.1)
Sodium: 138 mmol/L (ref 135–145)

## 2019-08-06 LAB — URINALYSIS, COMPLETE (UACMP) WITH MICROSCOPIC
Bilirubin Urine: NEGATIVE
Glucose, UA: 150 mg/dL — AB
Hgb urine dipstick: NEGATIVE
Ketones, ur: NEGATIVE mg/dL
Leukocytes,Ua: NEGATIVE
Nitrite: NEGATIVE
Protein, ur: NEGATIVE mg/dL
Specific Gravity, Urine: 1.011 (ref 1.005–1.030)
pH: 6 (ref 5.0–8.0)

## 2019-08-06 LAB — GLUCOSE, CAPILLARY
Glucose-Capillary: 180 mg/dL — ABNORMAL HIGH (ref 70–99)
Glucose-Capillary: 239 mg/dL — ABNORMAL HIGH (ref 70–99)
Glucose-Capillary: 119 mg/dL — ABNORMAL HIGH (ref 70–99)

## 2019-08-06 LAB — HEMOGLOBIN A1C
Hgb A1c MFr Bld: 7.3 % — ABNORMAL HIGH (ref 4.8–5.6)
Mean Plasma Glucose: 163 mg/dL

## 2019-08-06 SURGERY — LOOP RECORDER INSERTION

## 2019-08-06 MED ORDER — CLOPIDOGREL BISULFATE 75 MG PO TABS: 75.0000 mg | ORAL_TABLET | Freq: Every day | ORAL | 0 refills | Status: AC

## 2019-08-06 MED ORDER — ATORVASTATIN CALCIUM 40 MG PO TABS: 40.0000 mg | ORAL_TABLET | Freq: Every day | ORAL | 2 refills | Status: DC

## 2019-08-06 MED ORDER — LIDOCAINE-EPINEPHRINE 1 %-1:100000 IJ SOLN
INTRAMUSCULAR | Status: AC
  Filled 2019-08-06: qty 1

## 2019-08-06 MED ORDER — LIDOCAINE-EPINEPHRINE 1 %-1:100000 IJ SOLN
INTRAMUSCULAR | Status: DC | PRN
  Administered 2019-08-06: 20 mL

## 2019-08-06 MED ORDER — ASPIRIN 325 MG PO TABS: 325.0000 mg | ORAL_TABLET | Freq: Every day | ORAL | Status: DC

## 2019-08-06 SURGICAL SUPPLY — 2 items
MONITOR REVEAL LINQ II (Prosthesis & Implant Heart) ×1 IMPLANT
PACK LOOP INSERTION (CUSTOM PROCEDURE TRAY) ×2 IMPLANT

## 2019-08-06 NOTE — H&P (View-Only) (Signed)
ELECTROPHYSIOLOGY CONSULT NOTE  Patient ID: Jean Watts MRN: [id number], DOB/AGE: 09-22-1940   Admit date: 08/04/2019 Date of Consult: 08/06/2019  Primary Physician: Rosita Fire, MD Reason for Consultation: Cryptogenic stroke; recommendations regarding Implantable Loop Recorder  History of Present Illness EP has been asked to evaluate Jean Watts for placement of an implantable loop recorder to monitor for atrial fibrillation by Dr Erlinda Hong. 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  The patient was admitted on 08/04/2019 with aphasia and right sided weakness.  Imaging demonstrated small L MCA infarcts felt to be embolic 2/2 unknown source.  She was intubated for airway protection during admission and has since been extubated.  she has undergone workup for stroke including echocardiogram and carotid imaging.  The patient has been monitored on telemetry which has demonstrated sinus rhythm with no arrhythmias.    Echocardiogram this admission demonstrated EF 60-65%, no RWMA, LA 32.  Lab work is reviewed.  Prior to admission, the patient denies chest pain, shortness of breath, dizziness, palpitations, or syncope.  They are recovering from their stroke with plans to return home at discharge.    Past Medical History:  Diagnosis Date  . Dyspnea   . HLD (hyperlipidemia)   . HTN (hypertension)   . Neuropathy   . Type 2 diabetes mellitus with diabetic polyneuropathy ([place])   . Visual loss    right eye     Surgical History:  Past Surgical History:  Procedure Laterality Date  . ABDOMINAL HYSTERECTOMY    . BREAST BIOPSY  02/2019  . FLEXIBLE SIGMOIDOSCOPY N/A 06/04/2019   Procedure: FLEXIBLE SIGMOIDOSCOPY;  Surgeon: Daneil Dolin, MD;  Location: [facility];  Service: Endoscopy;  Laterality: N/A;  incomplete colonoscopy, poor prep  . RADIOLOGY WITH ANESTHESIA N/A 08/04/2019   Procedure: IR WITH ANESTHESIA;  Surgeon: Radiologist,  Medication, MD;  Location: [place];  Service: Radiology;  Laterality: N/A;     Medications Prior to Admission  Medication Sig Dispense Refill Last Dose  . albuterol (PROVENTIL) (2.5 MG/3ML) 0.083% nebulizer solution Inhale 2.5 mg into the lungs every 6 (six) hours as needed for wheezing or shortness of breath.    08/03/2019 at Unknown time  . amitriptyline (ELAVIL) 50 MG tablet Take 50 mg by mouth at bedtime.    08/03/2019 at 2300  . amLODipine-benazepril (LOTREL) 5-10 MG capsule Take 1 capsule by mouth daily.   08/03/2019 at 1800  . gabapentin (NEURONTIN) 400 MG capsule Take 400 mg by mouth 3 (three) times daily.    08/03/2019 at 2300  . HUMULIN N 100 UNIT/ML injection Inject 20-40 Units into the skin See admin instructions. Inject 40 units into the skin in the morning and 20 units in the evening   08/03/2019 at 1700  . meclizine (ANTIVERT) 25 MG tablet Take 25 mg by mouth at bedtime.    08/03/2019 at 2300  . metFORMIN (GLUCOPHAGE) 1000 MG tablet Take 1,000 mg by mouth 2 (two) times daily.    08/03/2019 at 1330  . metoprolol tartrate (LOPRESSOR) 25 MG tablet Take 25 mg by mouth 2 (two) times daily.    08/03/2019 at 1330    Inpatient Medications:  .  stroke: mapping our early stages of recovery book   Does not apply Once  . amitriptyline  50 mg Oral QHS  . aspirin  325 mg Oral Daily  . atorvastatin  40 mg Oral q1800  . Chlorhexidine Gluconate Cloth  6 each Topical  Daily  . clopidogrel  75 mg Oral Daily  . gabapentin  400 mg Oral TID  . insulin aspart  0-20 Units Subcutaneous TID WC & HS  . metoprolol tartrate  25 mg Oral BID  . mupirocin ointment  1 application Nasal BID  . pantoprazole  40 mg Oral Daily    Allergies: No Known Allergies  Social History   Socioeconomic History  . Marital status: Unknown    Spouse name: Not on file  . Number of children: Not on file  . Years of education: Not on file  . Highest education level: Not on file  Occupational History  . Not on file  Tobacco Use  .  Smoking status: Never Smoker  . Smokeless tobacco: Never Used  Substance and Sexual Activity  . Alcohol use: Never  . Drug use: Never  . Sexual activity: Not Currently  Other Topics Concern  . Not on file  Social History Narrative  . Not on file   Social Determinants of Health   Financial Resource Strain:   . Difficulty of Paying Living Expenses: Not on file  Food Insecurity:   . Worried About Charity fundraiser in the Last Year: Not on file  . Ran Out of Food in the Last Year: Not on file  Transportation Needs:   . Lack of Transportation (Medical): Not on file  . Lack of Transportation (Non-Medical): Not on file  Physical Activity:   . Days of Exercise per Week: Not on file  . Minutes of Exercise per Session: Not on file  Stress:   . Feeling of Stress : Not on file  Social Connections:   . Frequency of Communication with Friends and Family: Not on file  . Frequency of Social Gatherings with Friends and Family: Not on file  . Attends Religious Services: Not on file  . Active Member of Clubs or Organizations: Not on file  . Attends Archivist Meetings: Not on file  . Marital Status: Not on file  Intimate Partner Violence:   . Fear of Current or Ex-Partner: Not on file  . Emotionally Abused: Not on file  . Physically Abused: Not on file  . Sexually Abused: Not on file     Family History  Problem Relation Age of Onset  . Colon cancer Neg Hx   . Colon polyps Neg Hx       Review of Systems: All other systems reviewed and are otherwise negative except as noted above.  Physical Exam: Vitals:   08/05/19 2100 08/05/19 2228 08/05/19 2307 08/06/19 0328  BP:  (!) 125/54 (!) 136/47 (!) 112/55  Pulse: 62 (!) 59 62 (!) 57  Resp: 18 18 19 18   Temp:  98.4 F (36.9 C) 97.9 F (36.6 C) 98.4 F (36.9 C)  TempSrc:  Oral Oral Oral  SpO2: 98% 97% 99% 96%  Weight:      Height:        GEN- The patient is well appearing, alert and oriented x 3 today.   Head-  normocephalic, atraumatic Eyes-  Sclera clear, conjunctiva pink Ears- hearing intact Oropharynx- clear Neck- supple Lungs- Clear to ausculation bilaterally, normal work of breathing Heart- Regular rate and rhythm, no murmurs, rubs or gallops  GI- soft, NT, ND, + BS Extremities- no clubbing, cyanosis, or edema MS- no significant deformity or atrophy Skin- no rash or lesion Psych- euthymic mood, full affect   Labs:   Lab Results  Component Value Date   WBC  17.6 (H) 08/06/2019   HGB 11.2 (L) 08/06/2019   HCT 32.2 (L) 08/06/2019   MCV 91.2 08/06/2019   PLT 381 08/06/2019    Recent Labs  Lab 08/04/19 1346 08/04/19 1931 08/06/19 0315  NA 140   < > 138  K 4.1   < > 4.4  CL 107   < > 108  CO2 25   < > 20*  BUN 14   < > 9  CREATININE 0.85   < > 0.83  CALCIUM 9.1   < > 8.7*  PROT 7.2  --   --   BILITOT 0.4  --   --   ALKPHOS 111  --   --   ALT 12  --   --   AST 14*  --   --   GLUCOSE 100*   < > 177*   < > = values in this interval not displayed.     Radiology/Studies: CT Angio Head W or Wo Contrast  Result Date: 08/04/2019 CLINICAL DATA:  Right-sided weakness and facial droop. Expressive aphasia. Patient awoke with symptoms. Last seen normal at 11 o'clock p.m. last night EXAM: CT ANGIOGRAPHY HEAD AND NECK CT PERFUSION BRAIN TECHNIQUE: Multidetector CT imaging of the head and neck was performed using the standard protocol during bolus administration of intravenous contrast. Multiplanar CT image reconstructions and MIPs were obtained to evaluate the vascular anatomy. Carotid stenosis measurements (when applicable) are obtained utilizing NASCET criteria, using the distal internal carotid diameter as the denominator. Multiphase CT imaging of the brain was performed following IV bolus contrast injection. Subsequent parametric perfusion maps were calculated using RAPID software. CONTRAST:  125mL OMNIPAQUE IOHEXOL 350 MG/ML SOLN COMPARISON:  CT head without contrast of the same day.  FINDINGS: CTA NECK FINDINGS Aortic arch: A 3 vessel arch configuration is present. Atherosclerotic calcifications are present at the arch. The proximal descending aorta is enlarged, measuring 3.4 cm. No significant stenosis is present at the great vessel origins. Right carotid system: The right common carotid artery is tortuous without significant stenosis. Minimal atherosclerotic changes are noted at the right carotid bifurcation. There is no significant stenosis. Moderate tortuosity is present in the more distal cervical right ICA without significant stenosis. Left carotid system: The left common carotid artery is tortuous without significant stenosis. Atherosclerotic changes are noted at the bifurcation. There is no significant stenosis. Moderate tortuosity is present in the cervical left ICA without significant stenosis. Vertebral arteries: The left vertebral artery is the dominant vessel. Both vertebral arteries originate from the subclavian arteries without significant stenosis. There is no significant stenosis of either vertebral artery in neck. Skeleton: Multilevel degenerative changes are present cervical spine. There is chronic loss of disc height at C5-6, C6-7, and C7-T1. Slight degenerative anterolisthesis is present at C2-3, C3-4, and C4-5. The patient is edentulous. No focal lytic or blastic lesions are present. Other neck: The soft tissues the neck demonstrate heterogeneous appearance of the thyroid without a dominant lesion. Salivary glands are within normal limits. No significant cervical adenopathy is present. Upper chest: Thoracic inlet is normal.  Lung apices are clear. Review of the MIP images confirms the above findings CTA HEAD FINDINGS Anterior circulation: Dense atherosclerotic calcifications are present throughout the cavernous internal carotid arteries bilaterally without a significant stenosis relative to the more distal vessels. The ICA termini are patent. A1 segments are normal. The  anterior communicating artery is patent. The right M1 segment is normal. The left M1 is occluded at the bifurcation. Left MCA  branch vessels fill via collaterals. Right MCA and bilateral ACA branch vessels are unremarkable. Posterior circulation: The left vertebral artery is the dominant vessel. PICA origins are visualized and normal. Vertebrobasilar junction is normal. The basilar artery is within normal limits. Both posterior cerebral arteries originate from the basilar tip. There is some attenuation of distal PCA branch vessels. Asymmetric attenuation is present on the right. No significant proximal stenosis or occlusion is present. Venous sinuses: The dural sinuses are patent. The straight sinus and deep cerebral veins are intact. Cortical veins are unremarkable. Anatomic variants: None Review of the MIP images confirms the above findings CT Brain Perfusion Findings: ASPECTS: 7/10 CBF (<30%) Volume: 28mL Perfusion (Tmax>6.0s) volume: 61mL Mismatch Volume: 48mL Infarction Location:Left MCA territory including the insular cortex and posterior left frontal lobe IMPRESSION: 1. 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 2. Left MCA branch vessels fill via collateral vessels. 3. Significant left MCA territory ischemia. T-max greater than 6 seconds is measured at 38 mL. 4. No significant core infarct. 5. Dense atherosclerotic changes within the cavernous internal carotid arteries bilaterally without focal stenosis. 6. Moderate tortuosity of the cervical internal carotid arteries bilaterally, likely secondary to hypertension. 7. Minimal atherosclerotic changes at the carotid bifurcations bilaterally without significant stenosis. These results were called by telephone at the time of interpretation on 08/04/2019 at 3:05 pm to provider Veryl Speak , who verbally acknowledged these results. Electronically Signed   By: San Morelle M.D.   On: 08/04/2019 15:28   DG Abd 1 View  Result Date:  08/05/2019 CLINICAL DATA:  Assess for foreign body for MRI EXAM: ABDOMEN - 1 VIEW COMPARISON:  None. FINDINGS: Nonspecific gaseous distention of the colon. No high-grade obstructive small bowel gas pattern is seen. No radiopaque foreign bodies are evident. No suspicious calcifications. Degenerative changes present in the spine and pelvis. IMPRESSION: No radiopaque foreign bodies are evident. Nonspecific gaseous distention of the colon. No high-grade obstructive small bowel gas pattern. Electronically Signed   By: Lovena Le M.D.   On: 08/05/2019 02:25   CT Angio Neck W and/or Wo Contrast  Result Date: 08/04/2019 CLINICAL DATA:  Right-sided weakness and facial droop. Expressive aphasia. Patient awoke with symptoms. Last seen normal at 11 o'clock p.m. last night EXAM: CT ANGIOGRAPHY HEAD AND NECK CT PERFUSION BRAIN TECHNIQUE: Multidetector CT imaging of the head and neck was performed using the standard protocol during bolus administration of intravenous contrast. Multiplanar CT image reconstructions and MIPs were obtained to evaluate the vascular anatomy. Carotid stenosis measurements (when applicable) are obtained utilizing NASCET criteria, using the distal internal carotid diameter as the denominator. Multiphase CT imaging of the brain was performed following IV bolus contrast injection. Subsequent parametric perfusion maps were calculated using RAPID software. CONTRAST:  165mL OMNIPAQUE IOHEXOL 350 MG/ML SOLN COMPARISON:  CT head without contrast of the same day. FINDINGS: CTA NECK FINDINGS Aortic arch: A 3 vessel arch configuration is present. Atherosclerotic calcifications are present at the arch. The proximal descending aorta is enlarged, measuring 3.4 cm. No significant stenosis is present at the great vessel origins. Right carotid system: The right common carotid artery is tortuous without significant stenosis. Minimal atherosclerotic changes are noted at the right carotid bifurcation. There is no  significant stenosis. Moderate tortuosity is present in the more distal cervical right ICA without significant stenosis. Left carotid system: The left common carotid artery is tortuous without significant stenosis. Atherosclerotic changes are noted at the bifurcation. There is no significant stenosis. Moderate tortuosity is  present in the cervical left ICA without significant stenosis. Vertebral arteries: The left vertebral artery is the dominant vessel. Both vertebral arteries originate from the subclavian arteries without significant stenosis. There is no significant stenosis of either vertebral artery in neck. Skeleton: Multilevel degenerative changes are present cervical spine. There is chronic loss of disc height at C5-6, C6-7, and C7-T1. Slight degenerative anterolisthesis is present at C2-3, C3-4, and C4-5. The patient is edentulous. No focal lytic or blastic lesions are present. Other neck: The soft tissues the neck demonstrate heterogeneous appearance of the thyroid without a dominant lesion. Salivary glands are within normal limits. No significant cervical adenopathy is present. Upper chest: Thoracic inlet is normal.  Lung apices are clear. Review of the MIP images confirms the above findings CTA HEAD FINDINGS Anterior circulation: Dense atherosclerotic calcifications are present throughout the cavernous internal carotid arteries bilaterally without a significant stenosis relative to the more distal vessels. The ICA termini are patent. A1 segments are normal. The anterior communicating artery is patent. The right M1 segment is normal. The left M1 is occluded at the bifurcation. Left MCA branch vessels fill via collaterals. Right MCA and bilateral ACA branch vessels are unremarkable. Posterior circulation: The left vertebral artery is the dominant vessel. PICA origins are visualized and normal. Vertebrobasilar junction is normal. The basilar artery is within normal limits. Both posterior cerebral arteries  originate from the basilar tip. There is some attenuation of distal PCA branch vessels. Asymmetric attenuation is present on the right. No significant proximal stenosis or occlusion is present. Venous sinuses: The dural sinuses are patent. The straight sinus and deep cerebral veins are intact. Cortical veins are unremarkable. Anatomic variants: None Review of the MIP images confirms the above findings CT Brain Perfusion Findings: ASPECTS: 7/10 CBF (<30%) Volume: 58mL Perfusion (Tmax>6.0s) volume: 65mL Mismatch Volume: 60mL Infarction Location:Left MCA territory including the insular cortex and posterior left frontal lobe IMPRESSION: 1. 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 2. Left MCA branch vessels fill via collateral vessels. 3. Significant left MCA territory ischemia. T-max greater than 6 seconds is measured at 38 mL. 4. No significant core infarct. 5. Dense atherosclerotic changes within the cavernous internal carotid arteries bilaterally without focal stenosis. 6. Moderate tortuosity of the cervical internal carotid arteries bilaterally, likely secondary to hypertension. 7. Minimal atherosclerotic changes at the carotid bifurcations bilaterally without significant stenosis. These results were called by telephone at the time of interpretation on 08/04/2019 at 3:05 pm to provider Veryl Speak , who verbally acknowledged these results. Electronically Signed   By: San Morelle M.D.   On: 08/04/2019 15:28   MR ANGIO HEAD WO CONTRAST  Result Date: 08/05/2019 CLINICAL DATA:  Right-sided weakness and aphasia EXAM: MRI HEAD WITHOUT CONTRAST MRA HEAD WITHOUT CONTRAST TECHNIQUE: Multiplanar, multiecho pulse sequences of the brain and surrounding structures were obtained without intravenous contrast. Angiographic images of the head were obtained using MRA technique without contrast. COMPARISON:  None. FINDINGS: MRI HEAD FINDINGS Brain: There is an area of abnormal diffusion  restriction within the left insular ribbon and medial left temporal lobe. Punctate focus of diffusion restriction within the subcortical left frontal lobe (series 9, image 79). No contralateral or infratentorial ischemia. Early confluent hyperintense T2-weighted signal of the periventricular and deep white matter, most commonly due to chronic ischemic microangiopathy. Mild generalized atrophy. Two chronic micro hemorrhagic foci in the left temporal lobe. Normal midline structures. Vascular: Normal flow voids. Skull and upper cervical spine: Normal marrow signal. Sinuses/Orbits: Right phthisis  bulbi. Paranasal sinuses and mastoids are clear. Other: None MRA HEAD FINDINGS POSTERIOR CIRCULATION: --Vertebral arteries: Normal V4 segments. --Posterior inferior cerebellar arteries (PICA): Patent origins from the vertebral arteries. --Anterior inferior cerebellar arteries (AICA): Patent origins from the basilar artery. --Basilar artery: Normal. --Superior cerebellar arteries: Normal. --Posterior cerebral arteries: Normal. Both originate from the basilar artery. Posterior communicating arteries (p-comm) are diminutive or absent. ANTERIOR CIRCULATION: --Intracranial internal carotid arteries: Normal. --Anterior cerebral arteries (ACA): Normal. Both A1 segments are present. Patent anterior communicating artery (a-comm). --Middle cerebral arteries (MCA): Normal. IMPRESSION: 1. 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 2. Punctate focus of acute ischemia within the subcortical left frontal lobe. 3. Normal intracranial MRA. 4. Chronic ischemic microangiopathy. Electronically Signed   By: Ulyses Jarred M.D.   On: 08/05/2019 03:31   MR BRAIN WO CONTRAST  Result Date: 08/05/2019 CLINICAL DATA:  Right-sided weakness and aphasia EXAM: MRI HEAD WITHOUT CONTRAST MRA HEAD WITHOUT CONTRAST TECHNIQUE: Multiplanar, multiecho pulse sequences of the brain and surrounding structures were obtained  without intravenous contrast. Angiographic images of the head were obtained using MRA technique without contrast. COMPARISON:  None. FINDINGS: MRI HEAD FINDINGS Brain: There is an area of abnormal diffusion restriction within the left insular ribbon and medial left temporal lobe. Punctate focus of diffusion restriction within the subcortical left frontal lobe (series 9, image 79). No contralateral or infratentorial ischemia. Early confluent hyperintense T2-weighted signal of the periventricular and deep white matter, most commonly due to chronic ischemic microangiopathy. Mild generalized atrophy. Two chronic micro hemorrhagic foci in the left temporal lobe. Normal midline structures. Vascular: Normal flow voids. Skull and upper cervical spine: Normal marrow signal. Sinuses/Orbits: Right phthisis bulbi. Paranasal sinuses and mastoids are clear. Other: None MRA HEAD FINDINGS POSTERIOR CIRCULATION: --Vertebral arteries: Normal V4 segments. --Posterior inferior cerebellar arteries (PICA): Patent origins from the vertebral arteries. --Anterior inferior cerebellar arteries (AICA): Patent origins from the basilar artery. --Basilar artery: Normal. --Superior cerebellar arteries: Normal. --Posterior cerebral arteries: Normal. Both originate from the basilar artery. Posterior communicating arteries (p-comm) are diminutive or absent. ANTERIOR CIRCULATION: --Intracranial internal carotid arteries: Normal. --Anterior cerebral arteries (ACA): Normal. Both A1 segments are present. Patent anterior communicating artery (a-comm). --Middle cerebral arteries (MCA): Normal. IMPRESSION: 1. 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 2. Punctate focus of acute ischemia within the subcortical left frontal lobe. 3. Normal intracranial MRA. 4. Chronic ischemic microangiopathy. Electronically Signed   By: Ulyses Jarred M.D.   On: 08/05/2019 03:31   CT CEREBRAL PERFUSION W CONTRAST  Result Date:  08/04/2019 CLINICAL DATA:  Right-sided weakness and facial droop. Expressive aphasia. Patient awoke with symptoms. Last seen normal at 11 o'clock p.m. last night EXAM: CT ANGIOGRAPHY HEAD AND NECK CT PERFUSION BRAIN TECHNIQUE: Multidetector CT imaging of the head and neck was performed using the standard protocol during bolus administration of intravenous contrast. Multiplanar CT image reconstructions and MIPs were obtained to evaluate the vascular anatomy. Carotid stenosis measurements (when applicable) are obtained utilizing NASCET criteria, using the distal internal carotid diameter as the denominator. Multiphase CT imaging of the brain was performed following IV bolus contrast injection. Subsequent parametric perfusion maps were calculated using RAPID software. CONTRAST:  115mL OMNIPAQUE IOHEXOL 350 MG/ML SOLN COMPARISON:  CT head without contrast of the same day. FINDINGS: CTA NECK FINDINGS Aortic arch: A 3 vessel arch configuration is present. Atherosclerotic calcifications are present at the arch. The proximal descending aorta is enlarged,  measuring 3.4 cm. No significant stenosis is present at the great vessel origins. Right carotid system: The right common carotid artery is tortuous without significant stenosis. Minimal atherosclerotic changes are noted at the right carotid bifurcation. There is no significant stenosis. Moderate tortuosity is present in the more distal cervical right ICA without significant stenosis. Left carotid system: The left common carotid artery is tortuous without significant stenosis. Atherosclerotic changes are noted at the bifurcation. There is no significant stenosis. Moderate tortuosity is present in the cervical left ICA without significant stenosis. Vertebral arteries: The left vertebral artery is the dominant vessel. Both vertebral arteries originate from the subclavian arteries without significant stenosis. There is no significant stenosis of either vertebral artery in neck.  Skeleton: Multilevel degenerative changes are present cervical spine. There is chronic loss of disc height at C5-6, C6-7, and C7-T1. Slight degenerative anterolisthesis is present at C2-3, C3-4, and C4-5. The patient is edentulous. No focal lytic or blastic lesions are present. Other neck: The soft tissues the neck demonstrate heterogeneous appearance of the thyroid without a dominant lesion. Salivary glands are within normal limits. No significant cervical adenopathy is present. Upper chest: Thoracic inlet is normal.  Lung apices are clear. Review of the MIP images confirms the above findings CTA HEAD FINDINGS Anterior circulation: Dense atherosclerotic calcifications are present throughout the cavernous internal carotid arteries bilaterally without a significant stenosis relative to the more distal vessels. The ICA termini are patent. A1 segments are normal. The anterior communicating artery is patent. The right M1 segment is normal. The left M1 is occluded at the bifurcation. Left MCA branch vessels fill via collaterals. Right MCA and bilateral ACA branch vessels are unremarkable. Posterior circulation: The left vertebral artery is the dominant vessel. PICA origins are visualized and normal. Vertebrobasilar junction is normal. The basilar artery is within normal limits. Both posterior cerebral arteries originate from the basilar tip. There is some attenuation of distal PCA branch vessels. Asymmetric attenuation is present on the right. No significant proximal stenosis or occlusion is present. Venous sinuses: The dural sinuses are patent. The straight sinus and deep cerebral veins are intact. Cortical veins are unremarkable. Anatomic variants: None Review of the MIP images confirms the above findings CT Brain Perfusion Findings: ASPECTS: 7/10 CBF (<30%) Volume: 16mL Perfusion (Tmax>6.0s) volume: 15mL Mismatch Volume: 29mL Infarction Location:Left MCA territory including the insular cortex and posterior left frontal  lobe IMPRESSION: 1. 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 2. Left MCA branch vessels fill via collateral vessels. 3. Significant left MCA territory ischemia. T-max greater than 6 seconds is measured at 38 mL. 4. No significant core infarct. 5. Dense atherosclerotic changes within the cavernous internal carotid arteries bilaterally without focal stenosis. 6. Moderate tortuosity of the cervical internal carotid arteries bilaterally, likely secondary to hypertension. 7. Minimal atherosclerotic changes at the carotid bifurcations bilaterally without significant stenosis. These results were called by telephone at the time of interpretation on 08/04/2019 at 3:05 pm to provider Veryl Speak , who verbally acknowledged these results. Electronically Signed   By: San Morelle M.D.   On: 08/04/2019 15:28   DG Chest Port 1 View  Result Date: 08/04/2019 CLINICAL DATA:  Intubation EXAM: PORTABLE CHEST 1 VIEW COMPARISON:  None. FINDINGS: Endotracheal tube tip is at the level of the clavicular heads. Mild cardiomegaly with bihilar prominence. No focal airspace consolidation or pulmonary edema. No pleural effusion. IMPRESSION: Endotracheal tube tip at the level of the clavicular heads. Electronically Signed   By: Lennette Bihari  [place] M.D.   On: 08/04/2019 19:21   ECHOCARDIOGRAM COMPLETE  Result Date: 08/05/2019    ECHOCARDIOGRAM REPORT   Patient Name:   Jean Watts Date of Exam: 08/05/2019 Medical Rec #:  [id number]       Height:       64.0 in Accession #:    [id number]      Weight:       222.2 lb Date of Birth:  [birth year]       BSA:          2.05 m Patient Age:    32 years        BP:           133/62 mmHg Patient Gender: F               HR:           90 bpm. Exam Location:  Inpatient Procedure: 2D Echo Indications:    Stroke 434.91/I163.9  History:        Patient has no prior history of Echocardiogram examinations.                 Risk Factors:Hypertension, Diabetes and Dyslipidemia.   Sonographer:    Clayton Lefort RDCS (AE) Referring Phys: [id number] Radford Pax  Sonographer Comments: Patient is morbidly obese. IMPRESSIONS  1. Left ventricular ejection fraction, by estimation, is 60 to 65%. The left ventricle has normal function. The left ventrical has no regional wall motion abnormalities. There is mildly increased concentric left ventricular hypertrophy. Left ventricular  diastolic parameters are consistent with Grade I diastolic dysfunction (impaired relaxation).  2. Right ventricular systolic function is normal. The right ventricular size is normal. There is mildly elevated pulmonary artery systolic pressure.  3. No left atrial/left atrial appendage thrombus was detected.  4. The mitral valve is normal in structure and function. trivial mitral valve regurgitation. No evidence of mitral stenosis.  5. The aortic valve is tricuspid. Aortic valve regurgitation is not visualized. Mild aortic valve sclerosis is present, with no evidence of aortic valve stenosis.  6. The inferior vena cava is normal in size with greater than 50% respiratory variability, suggesting right atrial pressure of 3 mmHg. FINDINGS  Left Ventricle: Left ventricular ejection fraction, by estimation, is 60 to 65%. The left ventricle has normal function. The left ventricle has no regional wall motion abnormalities. The left ventricular internal cavity size was normal in size. There is  mildly increased concentric left ventricular hypertrophy. Left ventricular diastolic parameters are consistent with Grade I diastolic dysfunction (impaired relaxation). Right Ventricle: The right ventricular size is normal. No increase in right ventricular wall thickness. Right ventricular systolic function is normal. There is mildly elevated pulmonary artery systolic pressure. The tricuspid regurgitant velocity is 3.01  m/s, and with an assumed right atrial pressure of 3 mmHg, the estimated right ventricular systolic pressure is 123XX123 mmHg. Left  Atrium: Left atrial size was normal in size. Right Atrium: Right atrial size was normal in size. Pericardium: There is no evidence of pericardial effusion. Mitral Valve: The mitral valve is normal in structure and function. There is mild thickening of the mitral valve leaflet(s). Normal mobility of the mitral valve leaflets. Mild mitral annular calcification. Trivial mitral valve regurgitation. No evidence of mitral valve stenosis. MV peak gradient, 8.8 mmHg. The mean mitral valve gradient is 3.0 mmHg. Tricuspid Valve: The tricuspid valve is normal in structure. Tricuspid valve regurgitation is mild . No evidence of tricuspid stenosis. Aortic Valve: The aortic valve  is tricuspid. . There is severe thickening and severe calcifcation of the aortic valve. Aortic valve regurgitation is not visualized. Mild aortic valve sclerosis is present, with no evidence of aortic valve stenosis. There is severe thickening of the aortic valve. There is severe calcifcation of the aortic valve. Aortic valve mean gradient measures 10.0 mmHg. Aortic valve peak gradient measures 19.7 mmHg. Aortic valve area, by VTI measures 1.64 cm. Pulmonic Valve: The pulmonic valve was normal in structure. Pulmonic valve regurgitation is not visualized. No evidence of pulmonic stenosis. Aorta: The aortic root is normal in size and structure. Venous: The inferior vena cava is normal in size with greater than 50% respiratory variability, suggesting right atrial pressure of 3 mmHg. The inferior vena cava and the hepatic vein show a normal flow pattern. IAS/Shunts: No atrial level shunt detected by color flow Doppler.  LEFT VENTRICLE PLAX 2D LVIDd:         4.30 cm  Diastology LVIDs:         3.01 cm  LV e' lateral:   8.27 cm/s LV PW:         1.28 cm  LV E/e' lateral: 9.9 LV IVS:        1.15 cm  LV e' medial:    7.94 cm/s LVOT diam:     1.90 cm  LV E/e' medial:  10.3 LV SV:         69.75 ml LV SV Index:   21.89 LVOT Area:     2.84 cm  RIGHT VENTRICLE              IVC RV Basal diam:  2.51 cm     IVC diam: 1.74 cm RV S prime:     18.80 cm/s TAPSE (M-mode): 2.4 cm LEFT ATRIUM             Index       RIGHT ATRIUM           Index LA diam:        3.20 cm 1.56 cm/m  RA Area:     11.10 cm LA Vol (A2C):   45.8 ml 22.39 ml/m RA Volume:   20.70 ml  10.12 ml/m LA Vol (A4C):   57.6 ml 28.15 ml/m LA Biplane Vol: 52.3 ml 25.56 ml/m  AORTIC VALVE AV Area (Vmax):    1.65 cm AV Area (Vmean):   1.57 cm AV Area (VTI):     1.64 cm AV Vmax:           222.00 cm/s AV Vmean:          151.000 cm/s AV VTI:            0.425 m AV Peak Grad:      19.7 mmHg AV Mean Grad:      10.0 mmHg LVOT Vmax:         129.00 cm/s LVOT Vmean:        83.500 cm/s LVOT VTI:          0.246 m LVOT/AV VTI ratio: 0.58  AORTA Ao Root diam: 2.90 cm Ao Asc diam:  3.20 cm MITRAL VALVE                         TRICUSPID VALVE MV Area (PHT): 3.83 cm              TR Peak grad:   36.2 mmHg MV Peak grad:  8.8 mmHg  TR Vmax:        301.00 cm/s MV Mean grad:  3.0 mmHg MV Vmax:       1.48 m/s              SHUNTS MV Vmean:      78.1 cm/s             Systemic VTI:  0.25 m MV Decel Time: 198 msec              Systemic Diam: 1.90 cm MV E velocity: 81.90 cm/s  103 cm/s MV A velocity: 129.00 cm/s 70.3 cm/s MV E/A ratio:  0.63        1.5 Fransico Him MD Electronically signed by Fransico Him MD Signature Date/Time: 08/05/2019/10:32:04 AM    Final    CT HEAD CODE STROKE WO CONTRAST  Result Date: 08/04/2019 CLINICAL DATA:  Code stroke. Wake up stroke. Patient awoke with expressive aphasia. Unable to identify objects. Right-sided arm weakness and lip droop. EXAM: CT HEAD WITHOUT CONTRAST TECHNIQUE: Contiguous axial images were obtained from the base of the skull through the vertex without intravenous contrast. COMPARISON:  None. FINDINGS: Brain: There is diffuse loss of gray-white differentiation the posterior aspect of the left insular ribbon. Focal nonhemorrhagic infarct is also present in the posterior left frontal  lobe, best seen on image 20 of series 2. Moderate diffuse periventricular and subcortical white matter hypoattenuation is present bilaterally. No other focal cortical abnormalities are present. The basal ganglia are intact. There is of ischemia are noted within the thalami bilaterally, likely remote. The brainstem is normal. Moderate cerebellar atrophy is noted. The ventricles are proportionate to the degree of atrophy. No significant extraaxial fluid collection is present. Vascular: Dense atherosclerotic calcifications are present within the cavernous internal carotid arteries bilaterally. Asymmetric hyperdense left M1 segment is noted. This extends to the bifurcation. Vascular calcifications are noted at the dural margin of the vertebral arteries bilaterally. Skull: Calvarium is intact. No focal lytic or blastic lesions are present. Sinuses/Orbits: The paranasal sinuses and mastoid air cells are clear. Calcified right globe is noted. The left globe and orbit are normal. ASPECTS [facility] Stroke Program Early CT Score) - Ganglionic level infarction (caudate, lentiform nuclei, internal capsule, insula, M1-M3 cortex): 5/7 - Supraganglionic infarction (M4-M6 cortex): 1/3 Total score (0-10 with 10 being normal): 7/10 IMPRESSION: 1. Acute nonhemorrhagic infarcts involving the posterior left insular ribbon and posterior super ganglionic left frontal lobe. 2. Asymmetric hyperdense left M1 segment suggesting thrombus. 3. Advanced atrophy and diffuse white matter disease likely reflects the sequela of chronic microvascular ischemia. 4. Ischemic changes of the thalami bilaterally are likely remote. 5. ASPECTS is 7/10 If patient was last seen well within the last 24 hours, CT perfusion may be useful for further evaluation of brain ischemia versus infarct. These results were called by telephone at the time of interpretation on 08/04/2019 at 2:08 pm to provider Veryl Speak , who verbally acknowledged these results. Electronically  Signed   By: San Morelle M.D.   On: 08/04/2019 14:12   VAS [place] LOWER EXTREMITY VENOUS (DVT)  Result Date: 08/06/2019  Lower Venous DVTStudy Indications: Stroke.  Comparison Study: no prior Performing Technologist: Abram Sander RVS  Examination Guidelines: A complete evaluation includes B-mode imaging, spectral Doppler, color Doppler, and power Doppler as needed of all accessible portions of each vessel. Bilateral testing is considered an integral part of a complete examination. Limited examinations for reoccurring indications may be performed as noted. The reflux portion of the  exam is performed with the patient in reverse Trendelenburg.  +---------+---------------+---------+-----------+----------+--------------+ RIGHT    CompressibilityPhasicitySpontaneityPropertiesThrombus Aging +---------+---------------+---------+-----------+----------+--------------+ CFV      Full           Yes      Yes                                 +---------+---------------+---------+-----------+----------+--------------+ SFJ      Full                                                        +---------+---------------+---------+-----------+----------+--------------+ FV Prox  Full                                                        +---------+---------------+---------+-----------+----------+--------------+ FV Mid   Full                                                        +---------+---------------+---------+-----------+----------+--------------+ FV DistalFull                                                        +---------+---------------+---------+-----------+----------+--------------+ PFV      Full                                                        +---------+---------------+---------+-----------+----------+--------------+ POP      Full           Yes      Yes                                  +---------+---------------+---------+-----------+----------+--------------+ PTV      Full                                                        +---------+---------------+---------+-----------+----------+--------------+ PERO     Full                                                        +---------+---------------+---------+-----------+----------+--------------+   +---------+---------------+---------+-----------+----------+--------------+ LEFT     CompressibilityPhasicitySpontaneityPropertiesThrombus Aging +---------+---------------+---------+-----------+----------+--------------+ CFV      Full           Yes      Yes                                 +---------+---------------+---------+-----------+----------+--------------+  SFJ      Full                                                        +---------+---------------+---------+-----------+----------+--------------+ FV Prox  Full                                                        +---------+---------------+---------+-----------+----------+--------------+ FV Mid   Full                                                        +---------+---------------+---------+-----------+----------+--------------+ FV DistalFull                                                        +---------+---------------+---------+-----------+----------+--------------+ PFV      Full                                                        +---------+---------------+---------+-----------+----------+--------------+ POP      Full           Yes      Yes                                 +---------+---------------+---------+-----------+----------+--------------+ PTV      Full                                                        +---------+---------------+---------+-----------+----------+--------------+ PERO     Full                                                         +---------+---------------+---------+-----------+----------+--------------+     Summary: BILATERAL: - No evidence of deep vein thrombosis seen in the lower extremities, bilaterally.   *See table(s) above for measurements and observations. Electronically signed by Harold Barban MD on 08/06/2019 at 7:27:03 AM.    Final     12-lead ECG sinus rhythm (personally reviewed) All prior EKG's in EPIC reviewed with no documented atrial fibrillation  Telemetry SR (personally reviewed)  Assessment and Plan:  1. Cryptogenic stroke The patient presents with cryptogenic stroke. I spoke at length with the patient about monitoring for afib with an implantable loop recorder.  Risks, benefits, and alteratives to implantable loop recorder were discussed with the  patient today.   At this time, the patient is very clear in their decision to proceed with implantable loop recorder.   Wound care was reviewed with the patient (keep incision clean and dry for 3 days).  Wound check scheduled and entered in AVS.  Please call with questions.   Chanetta Marshall, NP 08/06/2019 9:09 AM   I have seen, examined the patient, and reviewed the above assessment and plan.  Changes to above are made where necessary.  On exam, RRR.   The patient presents with cryptogenic stroke. I spoke at length with the patient about monitoring for afib an implantable loop recorder.  Risks, benefits, and alteratives to implantable loop recorder were discussed with the patient today.   At this time, the patient is very clear in their decision to proceed with implantable loop recorder.   Please call with questions.   Co Sign: Thompson Grayer, MD 08/06/2019 10:55 AM

## 2019-08-06 NOTE — Discharge Summary (Addendum)
Stroke Discharge Summary  Patient ID: Jean Watts   MRN: [id number]      DOB: 1940/08/10  Date of Admission: 08/04/2019 Date of Discharge: 08/06/2019  Attending Physician:  Rosalin Hawking, MD, Stroke MD Consultant(s):    Olene Craven) Estanislado Pandy, MD (Interventional Neuroradiologist), Chesley Mires, MD ( pulmonary/intensive care ), Thompson Grayer, MD (electrophysiology)  Patient's PCP:  Rosita Fire, MD  DISCHARGE DIAGNOSIS:  Principal Problem:   Acute ischemic left MCA stroke [facility]) s/p IR, embolic d/t unknown source Active Problems:   Status post stroke   Middle cerebral artery embolism, left   Essential hypertension   Hyperlipidemia LDL goal <70   Diabetes mellitus type II, uncontrolled ([place])   Diabetic polyneuropathy ([place])   Obesity   Migraine   CRAO (central retinal artery occlusion), right - 2012   Blind right eye  Allergies as of 08/06/2019   No Known Allergies     Medication List    STOP taking these medications   amLODipine-benazepril 5-10 MG capsule Commonly known as: LOTREL     TAKE these medications   albuterol (2.5 MG/3ML) 0.083% nebulizer solution Commonly known as: PROVENTIL Inhale 2.5 mg into the lungs every 6 (six) hours as needed for wheezing or shortness of breath.   amitriptyline 50 MG tablet Commonly known as: ELAVIL Take 50 mg by mouth at bedtime.   aspirin 325 MG tablet Take 1 tablet (325 mg total) by mouth daily. Start taking on: August 07, 2019   atorvastatin 40 MG tablet Commonly known as: LIPITOR Take 1 tablet (40 mg total) by mouth daily at 6 PM.   clopidogrel 75 MG tablet Commonly known as: PLAVIX Take 1 tablet (75 mg total) by mouth daily for 21 days. Take with aspirin x 21 days then stop and continue aspirin alone Start taking on: August 07, 2019   gabapentin 400 MG capsule Commonly known as: NEURONTIN Take 400 mg by mouth 3 (three) times daily.   HumuLIN N 100 UNIT/ML injection Generic drug: insulin NPH Human Inject  20-40 Units into the skin See admin instructions. Inject 40 units into the skin in the morning and 20 units in the evening   meclizine 25 MG tablet Commonly known as: ANTIVERT Take 25 mg by mouth at bedtime.   metFORMIN 1000 MG tablet Commonly known as: GLUCOPHAGE Take 1,000 mg by mouth 2 (two) times daily.   metoprolol tartrate 25 MG tablet Commonly known as: LOPRESSOR Take 25 mg by mouth 2 (two) times daily.            Durable Medical Equipment  (From admission, onward)         Start     Ordered   08/06/19 1058  For home use only DME 3 n 1  Once     08/06/19 1057         LABORATORY STUDIES CBC    Component Value Date/Time   WBC 5.1 08/06/2019 1536   RBC 3.44 (L) 08/06/2019 1536   HGB 9.4 (L) 08/06/2019 1536   HCT 28.3 (L) 08/06/2019 1536   PLT 231 08/06/2019 1536   MCV 82.3 08/06/2019 1536   MCH 27.3 08/06/2019 1536   MCHC 33.2 08/06/2019 1536   RDW 15.6 (H) 08/06/2019 1536   LYMPHSABS 1.5 08/05/2019 0500   MONOABS 0.5 08/05/2019 0500   EOSABS 0.0 08/05/2019 0500   BASOSABS 0.0 08/05/2019 0500   CMP    Component Value Date/Time   NA 138 08/06/2019 0315   K 4.4  08/06/2019 0315   CL 108 08/06/2019 0315   CO2 20 (L) 08/06/2019 0315   GLUCOSE 177 (H) 08/06/2019 0315   BUN 9 08/06/2019 0315   CREATININE 0.83 08/06/2019 0315   CALCIUM 8.7 (L) 08/06/2019 0315   PROT 7.2 08/04/2019 1346   ALBUMIN 3.9 08/04/2019 1346   AST 14 (L) 08/04/2019 1346   ALT 12 08/04/2019 1346   ALKPHOS 111 08/04/2019 1346   BILITOT 0.4 08/04/2019 1346   GFRNONAA >60 08/06/2019 0315   GFRAA >60 08/06/2019 0315   COAGS Lab Results  Component Value Date   INR 1.0 08/04/2019   Lipid Panel    Component Value Date/Time   CHOL 184 08/05/2019 0500   TRIG 40 08/05/2019 0500   TRIG 41 08/05/2019 0500   HDL 47 08/05/2019 0500   CHOLHDL 3.9 08/05/2019 0500   VLDL 8 08/05/2019 0500   LDLCALC 129 (H) 08/05/2019 0500   HgbA1C  Lab Results  Component Value Date   HGBA1C 7.3  (H) 08/05/2019   Urinalysis    Component Value Date/Time   COLORURINE YELLOW 08/06/2019 1630   APPEARANCEUR CLEAR 08/06/2019 1630   LABSPEC 1.011 08/06/2019 1630   PHURINE 6.0 08/06/2019 1630   GLUCOSEU 150 (A) 08/06/2019 1630   HGBUR NEGATIVE 08/06/2019 1630   BILIRUBINUR NEGATIVE 08/06/2019 1630   KETONESUR NEGATIVE 08/06/2019 1630   PROTEINUR NEGATIVE 08/06/2019 1630   NITRITE NEGATIVE 08/06/2019 1630   LEUKOCYTESUR NEGATIVE 08/06/2019 1630   Alcohol Level    Component Value Date/Time   ETH <10 08/04/2019 1346   SIGNIFICANT DIAGNOSTIC STUDIES CT Angio Head W or Wo Contrast  Result Date: 08/04/2019 CLINICAL DATA:  Right-sided weakness and facial droop. Expressive aphasia. Patient awoke with symptoms. Last seen normal at 11 o'clock p.m. last night EXAM: CT ANGIOGRAPHY HEAD AND NECK CT PERFUSION BRAIN TECHNIQUE: Multidetector CT imaging of the head and neck was performed using the standard protocol during bolus administration of intravenous contrast. Multiplanar CT image reconstructions and MIPs were obtained to evaluate the vascular anatomy. Carotid stenosis measurements (when applicable) are obtained utilizing NASCET criteria, using the distal internal carotid diameter as the denominator. Multiphase CT imaging of the brain was performed following IV bolus contrast injection. Subsequent parametric perfusion maps were calculated using RAPID software. CONTRAST:  197mL OMNIPAQUE IOHEXOL 350 MG/ML SOLN COMPARISON:  CT head without contrast of the same day. FINDINGS: CTA NECK FINDINGS Aortic arch: A 3 vessel arch configuration is present. Atherosclerotic calcifications are present at the arch. The proximal descending aorta is enlarged, measuring 3.4 cm. No significant stenosis is present at the great vessel origins. Right carotid system: The right common carotid artery is tortuous without significant stenosis. Minimal atherosclerotic changes are noted at the right carotid bifurcation. There is no  significant stenosis. Moderate tortuosity is present in the more distal cervical right ICA without significant stenosis. Left carotid system: The left common carotid artery is tortuous without significant stenosis. Atherosclerotic changes are noted at the bifurcation. There is no significant stenosis. Moderate tortuosity is present in the cervical left ICA without significant stenosis. Vertebral arteries: The left vertebral artery is the dominant vessel. Both vertebral arteries originate from the subclavian arteries without significant stenosis. There is no significant stenosis of either vertebral artery in neck. Skeleton: Multilevel degenerative changes are present cervical spine. There is chronic loss of disc height at C5-6, C6-7, and C7-T1. Slight degenerative anterolisthesis is present at C2-3, C3-4, and C4-5. The patient is edentulous. No focal lytic or blastic lesions are present.  Other neck: The soft tissues the neck demonstrate heterogeneous appearance of the thyroid without a dominant lesion. Salivary glands are within normal limits. No significant cervical adenopathy is present. Upper chest: Thoracic inlet is normal.  Lung apices are clear. Review of the MIP images confirms the above findings CTA HEAD FINDINGS Anterior circulation: Dense atherosclerotic calcifications are present throughout the cavernous internal carotid arteries bilaterally without a significant stenosis relative to the more distal vessels. The ICA termini are patent. A1 segments are normal. The anterior communicating artery is patent. The right M1 segment is normal. The left M1 is occluded at the bifurcation. Left MCA branch vessels fill via collaterals. Right MCA and bilateral ACA branch vessels are unremarkable. Posterior circulation: The left vertebral artery is the dominant vessel. PICA origins are visualized and normal. Vertebrobasilar junction is normal. The basilar artery is within normal limits. Both posterior cerebral arteries  originate from the basilar tip. There is some attenuation of distal PCA branch vessels. Asymmetric attenuation is present on the right. No significant proximal stenosis or occlusion is present. Venous sinuses: The dural sinuses are patent. The straight sinus and deep cerebral veins are intact. Cortical veins are unremarkable. Anatomic variants: None Review of the MIP images confirms the above findings CT Brain Perfusion Findings: ASPECTS: 7/10 CBF (<30%) Volume: 37mL Perfusion (Tmax>6.0s) volume: 57mL Mismatch Volume: 46mL Infarction Location:Left MCA territory including the insular cortex and posterior left frontal lobe IMPRESSION: 1. 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 2. Left MCA branch vessels fill via collateral vessels. 3. Significant left MCA territory ischemia. T-max greater than 6 seconds is measured at 38 mL. 4. No significant core infarct. 5. Dense atherosclerotic changes within the cavernous internal carotid arteries bilaterally without focal stenosis. 6. Moderate tortuosity of the cervical internal carotid arteries bilaterally, likely secondary to hypertension. 7. Minimal atherosclerotic changes at the carotid bifurcations bilaterally without significant stenosis. These results were called by telephone at the time of interpretation on 08/04/2019 at 3:05 pm to provider Veryl Speak , who verbally acknowledged these results. Electronically Signed   By: San Morelle M.D.   On: 08/04/2019 15:28   DG Abd 1 View  Result Date: 08/05/2019 CLINICAL DATA:  Assess for foreign body for MRI EXAM: ABDOMEN - 1 VIEW COMPARISON:  None. FINDINGS: Nonspecific gaseous distention of the colon. No high-grade obstructive small bowel gas pattern is seen. No radiopaque foreign bodies are evident. No suspicious calcifications. Degenerative changes present in the spine and pelvis. IMPRESSION: No radiopaque foreign bodies are evident. Nonspecific gaseous distention of the colon. No  high-grade obstructive small bowel gas pattern. Electronically Signed   By: Lovena Le M.D.   On: 08/05/2019 02:25   CT Angio Neck W and/or Wo Contrast  Result Date: 08/04/2019 CLINICAL DATA:  Right-sided weakness and facial droop. Expressive aphasia. Patient awoke with symptoms. Last seen normal at 11 o'clock p.m. last night EXAM: CT ANGIOGRAPHY HEAD AND NECK CT PERFUSION BRAIN TECHNIQUE: Multidetector CT imaging of the head and neck was performed using the standard protocol during bolus administration of intravenous contrast. Multiplanar CT image reconstructions and MIPs were obtained to evaluate the vascular anatomy. Carotid stenosis measurements (when applicable) are obtained utilizing NASCET criteria, using the distal internal carotid diameter as the denominator. Multiphase CT imaging of the brain was performed following IV bolus contrast injection. Subsequent parametric perfusion maps were calculated using RAPID software. CONTRAST:  188mL OMNIPAQUE IOHEXOL 350 MG/ML SOLN COMPARISON:  CT head without contrast of the same day. FINDINGS: CTA  NECK FINDINGS Aortic arch: A 3 vessel arch configuration is present. Atherosclerotic calcifications are present at the arch. The proximal descending aorta is enlarged, measuring 3.4 cm. No significant stenosis is present at the great vessel origins. Right carotid system: The right common carotid artery is tortuous without significant stenosis. Minimal atherosclerotic changes are noted at the right carotid bifurcation. There is no significant stenosis. Moderate tortuosity is present in the more distal cervical right ICA without significant stenosis. Left carotid system: The left common carotid artery is tortuous without significant stenosis. Atherosclerotic changes are noted at the bifurcation. There is no significant stenosis. Moderate tortuosity is present in the cervical left ICA without significant stenosis. Vertebral arteries: The left vertebral artery is the  dominant vessel. Both vertebral arteries originate from the subclavian arteries without significant stenosis. There is no significant stenosis of either vertebral artery in neck. Skeleton: Multilevel degenerative changes are present cervical spine. There is chronic loss of disc height at C5-6, C6-7, and C7-T1. Slight degenerative anterolisthesis is present at C2-3, C3-4, and C4-5. The patient is edentulous. No focal lytic or blastic lesions are present. Other neck: The soft tissues the neck demonstrate heterogeneous appearance of the thyroid without a dominant lesion. Salivary glands are within normal limits. No significant cervical adenopathy is present. Upper chest: Thoracic inlet is normal.  Lung apices are clear. Review of the MIP images confirms the above findings CTA HEAD FINDINGS Anterior circulation: Dense atherosclerotic calcifications are present throughout the cavernous internal carotid arteries bilaterally without a significant stenosis relative to the more distal vessels. The ICA termini are patent. A1 segments are normal. The anterior communicating artery is patent. The right M1 segment is normal. The left M1 is occluded at the bifurcation. Left MCA branch vessels fill via collaterals. Right MCA and bilateral ACA branch vessels are unremarkable. Posterior circulation: The left vertebral artery is the dominant vessel. PICA origins are visualized and normal. Vertebrobasilar junction is normal. The basilar artery is within normal limits. Both posterior cerebral arteries originate from the basilar tip. There is some attenuation of distal PCA branch vessels. Asymmetric attenuation is present on the right. No significant proximal stenosis or occlusion is present. Venous sinuses: The dural sinuses are patent. The straight sinus and deep cerebral veins are intact. Cortical veins are unremarkable. Anatomic variants: None Review of the MIP images confirms the above findings CT Brain Perfusion Findings: ASPECTS:  7/10 CBF (<30%) Volume: 28mL Perfusion (Tmax>6.0s) volume: 32mL Mismatch Volume: 37mL Infarction Location:Left MCA territory including the insular cortex and posterior left frontal lobe IMPRESSION: 1. 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 2. Left MCA branch vessels fill via collateral vessels. 3. Significant left MCA territory ischemia. T-max greater than 6 seconds is measured at 38 mL. 4. No significant core infarct. 5. Dense atherosclerotic changes within the cavernous internal carotid arteries bilaterally without focal stenosis. 6. Moderate tortuosity of the cervical internal carotid arteries bilaterally, likely secondary to hypertension. 7. Minimal atherosclerotic changes at the carotid bifurcations bilaterally without significant stenosis. These results were called by telephone at the time of interpretation on 08/04/2019 at 3:05 pm to provider Veryl Speak , who verbally acknowledged these results. Electronically Signed   By: San Morelle M.D.   On: 08/04/2019 15:28   MR ANGIO HEAD WO CONTRAST  Result Date: 08/05/2019 CLINICAL DATA:  Right-sided weakness and aphasia EXAM: MRI HEAD WITHOUT CONTRAST MRA HEAD WITHOUT CONTRAST TECHNIQUE: Multiplanar, multiecho pulse sequences of the brain and surrounding structures were obtained without intravenous contrast. Angiographic  images of the head were obtained using MRA technique without contrast. COMPARISON:  None. FINDINGS: MRI HEAD FINDINGS Brain: There is an area of abnormal diffusion restriction within the left insular ribbon and medial left temporal lobe. Punctate focus of diffusion restriction within the subcortical left frontal lobe (series 9, image 79). No contralateral or infratentorial ischemia. Early confluent hyperintense T2-weighted signal of the periventricular and deep white matter, most commonly due to chronic ischemic microangiopathy. Mild generalized atrophy. Two chronic micro hemorrhagic foci in the left temporal  lobe. Normal midline structures. Vascular: Normal flow voids. Skull and upper cervical spine: Normal marrow signal. Sinuses/Orbits: Right phthisis bulbi. Paranasal sinuses and mastoids are clear. Other: None MRA HEAD FINDINGS POSTERIOR CIRCULATION: --Vertebral arteries: Normal V4 segments. --Posterior inferior cerebellar arteries (PICA): Patent origins from the vertebral arteries. --Anterior inferior cerebellar arteries (AICA): Patent origins from the basilar artery. --Basilar artery: Normal. --Superior cerebellar arteries: Normal. --Posterior cerebral arteries: Normal. Both originate from the basilar artery. Posterior communicating arteries (p-comm) are diminutive or absent. ANTERIOR CIRCULATION: --Intracranial internal carotid arteries: Normal. --Anterior cerebral arteries (ACA): Normal. Both A1 segments are present. Patent anterior communicating artery (a-comm). --Middle cerebral arteries (MCA): Normal. IMPRESSION: 1. 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 2. Punctate focus of acute ischemia within the subcortical left frontal lobe. 3. Normal intracranial MRA. 4. Chronic ischemic microangiopathy. Electronically Signed   By: Ulyses Jarred M.D.   On: 08/05/2019 03:31   MR BRAIN WO CONTRAST  Result Date: 08/05/2019 CLINICAL DATA:  Right-sided weakness and aphasia EXAM: MRI HEAD WITHOUT CONTRAST MRA HEAD WITHOUT CONTRAST TECHNIQUE: Multiplanar, multiecho pulse sequences of the brain and surrounding structures were obtained without intravenous contrast. Angiographic images of the head were obtained using MRA technique without contrast. COMPARISON:  None. FINDINGS: MRI HEAD FINDINGS Brain: There is an area of abnormal diffusion restriction within the left insular ribbon and medial left temporal lobe. Punctate focus of diffusion restriction within the subcortical left frontal lobe (series 9, image 79). No contralateral or infratentorial ischemia. Early confluent  hyperintense T2-weighted signal of the periventricular and deep white matter, most commonly due to chronic ischemic microangiopathy. Mild generalized atrophy. Two chronic micro hemorrhagic foci in the left temporal lobe. Normal midline structures. Vascular: Normal flow voids. Skull and upper cervical spine: Normal marrow signal. Sinuses/Orbits: Right phthisis bulbi. Paranasal sinuses and mastoids are clear. Other: None MRA HEAD FINDINGS POSTERIOR CIRCULATION: --Vertebral arteries: Normal V4 segments. --Posterior inferior cerebellar arteries (PICA): Patent origins from the vertebral arteries. --Anterior inferior cerebellar arteries (AICA): Patent origins from the basilar artery. --Basilar artery: Normal. --Superior cerebellar arteries: Normal. --Posterior cerebral arteries: Normal. Both originate from the basilar artery. Posterior communicating arteries (p-comm) are diminutive or absent. ANTERIOR CIRCULATION: --Intracranial internal carotid arteries: Normal. --Anterior cerebral arteries (ACA): Normal. Both A1 segments are present. Patent anterior communicating artery (a-comm). --Middle cerebral arteries (MCA): Normal. IMPRESSION: 1. 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 2. Punctate focus of acute ischemia within the subcortical left frontal lobe. 3. Normal intracranial MRA. 4. Chronic ischemic microangiopathy. Electronically Signed   By: Ulyses Jarred M.D.   On: 08/05/2019 03:31   EP PPM/ICD IMPLANT  Result Date: 08/06/2019 SURGEON:  Thompson Grayer, MD   PREPROCEDURE DIAGNOSIS:  Cryptogenic Stroke   POSTPROCEDURE DIAGNOSIS:  Cryptogenic Stroke    PROCEDURES:  1. Implantable loop recorder implantation   INTRODUCTION:  Jean Watts is a 79 y.o. female with a history of unexplained stroke  who presents today for implantable loop implantation.  The patient has had a cryptogenic stroke.  Despite an extensive workup by neurology, no reversible causes have been identified.   she has worn telemetry during which she did not have arrhythmias.  There is significant concern for possible atrial fibrillation as the cause for the patients stroke.  The patient therefore presents today for implantable loop implantation.   DESCRIPTION OF PROCEDURE:  Informed written consent was obtained.  The patient required no sedation for the procedure today.  The patients left chest was prepped and draped. Mapping over the patient's chest was performed to identify the appropriate ILR site.  This area was found to be the left parasternal region over the 3rd-4th intercostal space.  The skin overlying this region was infiltrated with lidocaine for local analgesia.  A 0.5-cm incision was made at the implant site.  A subcutaneous ILR pocket was fashioned using a combination of sharp and blunt dissection.  A Medtronic Reveal Linq model [id number] implantable loop recorder was then placed into the pocket R waves were very prominent and measured > 0.2 mV. EBL<1 ml.  Steri- Strips and a sterile dressing were then applied.  There were no early apparent complications.   CONCLUSIONS:  1. Successful implantation of a Medtronic Reveal LINQ implantable loop recorder for cryptogenic stroke  2. No early apparent complications. Thompson Grayer MD, [facility] 08/06/2019 11:19 AM   IR CT Head Ltd  Result Date: 08/04/2019 INDICATION: New onset left-sided gaze, right-sided hemiparesis. Occluded left middle cerebral artery distal A1 segment extending into the superior and inferior divisions on CT angiogram of the head and neck.  EXAM: 1. EMERGENT LARGE VESSEL OCCLUSION THROMBOLYSIS (anterior CIRCULATION)  COMPARISON:  CT angiogram of the head and neck of August 04, 2019.  MEDICATIONS: Ancef 2 g IV antibiotic was administered within 1 hour of the procedure.  ANESTHESIA/SEDATION: General anesthesia.  CONTRAST:  Isovue 300 approximately 60 mL.  FLUOROSCOPY TIME:  Fluoroscopy Time: 40 minutes 0 seconds (1801 mGy).  COMPLICATIONS: None  immediate.  TECHNIQUE: Following a full explanation of the procedure along with the potential associated complications, an informed witnessed consent was obtained from the patient's daughter. The risks of intracranial hemorrhage of 10%, worsening neurological deficit, ventilator dependency, death and inability to revascularize were all reviewed in detail with the patient's daughter.  The patient was then put under general anesthesia by the Department of Anesthesiology at [facility].  The right groin was prepped and draped in the usual sterile fashion. Thereafter using modified Seldinger technique, transfemoral access into the right common femoral artery was obtained without difficulty. Over a 0.035 inch guidewire a 8 [place] pinnacle sheath was inserted. Through this, and also over a 0.035 inch guidewire a 5 [place] JB 1 catheter was advanced to the aortic arch region and selectively positioned in the left common carotid artery.  FINDINGS: The left common carotid arteriogram demonstrates the origin of the left external carotid artery and its major branches to be widely patent.  The left internal carotid artery at the bulb is widely patent. The junction of the mid and the distal 1/3 demonstrates an O-shaped tortuosity without evidence of kinking.  Distal to this the distal left cervical ICA is widely patent.  The petrous, cavernous and supraclinoid segments are widely patent.  The left middle cerebral artery demonstrates complete occlusion of the M1 segment, with complete occlusion of the superior division, and near complete occlusion of the inferior division.  The left anterior cerebral artery opacifies  into the capillary and venous phases.  PROCEDURE: The diagnostic JB 1 catheter in the left common carotid artery was exchanged over a 0.035 inch 300 cm Rosen exchange guidewire for an 8 [place] Pinnacle sheath in the right groin. This was connected to continuous heparinized saline infusion.  Over the  exchange guidewire, an 087 balloon guide catheter which had been prepped with 50% contrast and 50% heparinized saline infusion was advanced and positioned in the proximal 1/3 of the left internal carotid artery. The guidewire was removed. Good aspiration was obtained from the hub of the balloon guide catheter. A gentle control arteriogram performed through this demonstrated no change in the extracranial or intracranial circulation.  At this time, over a 0.014 inch standard Synchro micro guidewire with a J configuration, the combination of an 021 Trevo ProVue microcatheter, inside of a 6 French 132 cm Catalyst guide catheter was advanced to the supraclinoid left ICA without difficulty. The micro guidewire was then gently manipulated using a torque device through the occluded left middle cerebral artery and the inferior division M2 M3 region followed by the microcatheter. The guidewire was removed. Good aspiration obtained from the hub of the microcatheter. A gentle control arteriogram performed through this demonstrated safe position of the tip of the microcatheter which was then connected to continuous heparinized saline infusion.  A 4 mm x 40 mm Solitaire X retrieval device was advanced to the distal end of the microcatheter.  The O ring on the delivery microcatheter was loosened. With slight forward gentle traction with the right hand on the delivery micro guidewire with the left hand the delivery microcatheter was retrieved deploying the retrieval device.  A 6 [place] Catalyst guide catheter was then advanced into the proximal portion of the occluded left middle cerebral artery. Proximal flow arrest was then initiated by inflating the balloon in the left internal carotid artery. With constant aspiration being applied with a Penumbra aspiration device for approximately 2 minutes, at the hub of the 6 [place] guide catheter which had been advanced into the proximal portion of the occlusion of the left  middle cerebral artery, and with a 60 mL syringe at the hub of the balloon guide catheter, the combination of the retrieval device, the microcatheter and the 6 [place] Catalyst guide catheter was retrieved and removed. Following reversal of flow arrest, a control arteriogram performed through the balloon guide catheter demonstrated revascularization of the middle cerebral artery and the superior and the inferior divisions. However, there was a significant amount of filling defect still noted in the origins of the superior and inferior divisions.  A second pass was then made again using the above combination. The micro guidewire was then advanced with the microcatheter to the M2 M3 region of the inferior division. After having confirmed safe location of the tip of the microcatheter, a 6 mm x 40 mm Solitaire X retrieval device was advanced to the distal end of the microcatheter. This was then deployed in the usual fashion as described above. A control arteriogram performed through the 6 [place] Catalyst guide catheter in supraclinoid left ICA demonstrated a TICI 3 revascularization.  With proximal flow arrest in the left internal carotid artery by inflating the balloon of the balloon guide catheter, and constant aspiration being applied through the 6 [place] Catalyst guide catheter imbedded in the nearly occlusive superior and inferior divisions over 2 minutes with a Penumbra aspiration, and with a 60 mL syringe at the hub of the balloon guide catheter, the combination of  the retrieval device, the microcatheter and the 6 [place] Catalyst guide catheter were retrieved and removed. A clot was noted imbedded in the retrieval device, and also in the aspirate.  Following reversal of flow arrest, a control arteriogram performed through the balloon guide catheter in the left internal carotid proximally demonstrated a TICI 3 revascularization.  The anterior cerebral artery remained widely patent.  During the procedure, no  evidence of mass effect, midline shift or of extravasation was seen.  A final control arteriogram performed through the balloon guide catheter in the left common carotid artery demonstrated the left internal carotid artery to be widely patent without any evidence of spasms or dissections.  A flat panel CT of the brain demonstrated no evidence of mass effect, midline shift or intracranial hemorrhage.  The balloon guide catheter was retrieved and removed. The 8 French Pinnacle sheath in the right groin was then removed with the successful application of an 8 French Angio-Seal closure device for hemostasis. Distal pulses remained Dopplerable in the dorsalis pedis, and the posterior tibial regions bilaterally.  The patient's general anesthesia was then reversed, and the patient was extubated without difficulty. Patient had difficulty with expression, and was able to move her left arm and leg spontaneously.  She was then transferred to the neuro ICU to continue with post thrombectomy management.  IMPRESSION: Status post endovascular complete revascularization of occluded left middle cerebral artery and the superior and inferior divisions proximally with 1 pass with the Solitaire 4 mm x 40 mm X retrieval device, and 1 pass with the 6 mm x 40 mm Solitaire X retrieval device with a Penumbra aspiration achieving a TICI 3 revascularization.  PLAN: Follow-up in the clinic 4 weeks post discharge.   Electronically Signed   By: Jean Watts M.D.   On: 08/05/2019 16:19   CT CEREBRAL PERFUSION W CONTRAST  Result Date: 08/04/2019 CLINICAL DATA:  Right-sided weakness and facial droop. Expressive aphasia. Patient awoke with symptoms. Last seen normal at 11 o'clock p.m. last night EXAM: CT ANGIOGRAPHY HEAD AND NECK CT PERFUSION BRAIN TECHNIQUE: Multidetector CT imaging of the head and neck was performed using the standard protocol during bolus administration of intravenous contrast. Multiplanar CT image  reconstructions and MIPs were obtained to evaluate the vascular anatomy. Carotid stenosis measurements (when applicable) are obtained utilizing NASCET criteria, using the distal internal carotid diameter as the denominator. Multiphase CT imaging of the brain was performed following IV bolus contrast injection. Subsequent parametric perfusion maps were calculated using RAPID software. CONTRAST:  169mL OMNIPAQUE IOHEXOL 350 MG/ML SOLN COMPARISON:  CT head without contrast of the same day. FINDINGS: CTA NECK FINDINGS Aortic arch: A 3 vessel arch configuration is present. Atherosclerotic calcifications are present at the arch. The proximal descending aorta is enlarged, measuring 3.4 cm. No significant stenosis is present at the great vessel origins. Right carotid system: The right common carotid artery is tortuous without significant stenosis. Minimal atherosclerotic changes are noted at the right carotid bifurcation. There is no significant stenosis. Moderate tortuosity is present in the more distal cervical right ICA without significant stenosis. Left carotid system: The left common carotid artery is tortuous without significant stenosis. Atherosclerotic changes are noted at the bifurcation. There is no significant stenosis. Moderate tortuosity is present in the cervical left ICA without significant stenosis. Vertebral arteries: The left vertebral artery is the dominant vessel. Both vertebral arteries originate from the subclavian arteries without significant stenosis. There is no significant stenosis of either vertebral artery in neck. Skeleton: Multilevel  degenerative changes are present cervical spine. There is chronic loss of disc height at C5-6, C6-7, and C7-T1. Slight degenerative anterolisthesis is present at C2-3, C3-4, and C4-5. The patient is edentulous. No focal lytic or blastic lesions are present. Other neck: The soft tissues the neck demonstrate heterogeneous appearance of the thyroid without a dominant  lesion. Salivary glands are within normal limits. No significant cervical adenopathy is present. Upper chest: Thoracic inlet is normal.  Lung apices are clear. Review of the MIP images confirms the above findings CTA HEAD FINDINGS Anterior circulation: Dense atherosclerotic calcifications are present throughout the cavernous internal carotid arteries bilaterally without a significant stenosis relative to the more distal vessels. The ICA termini are patent. A1 segments are normal. The anterior communicating artery is patent. The right M1 segment is normal. The left M1 is occluded at the bifurcation. Left MCA branch vessels fill via collaterals. Right MCA and bilateral ACA branch vessels are unremarkable. Posterior circulation: The left vertebral artery is the dominant vessel. PICA origins are visualized and normal. Vertebrobasilar junction is normal. The basilar artery is within normal limits. Both posterior cerebral arteries originate from the basilar tip. There is some attenuation of distal PCA branch vessels. Asymmetric attenuation is present on the right. No significant proximal stenosis or occlusion is present. Venous sinuses: The dural sinuses are patent. The straight sinus and deep cerebral veins are intact. Cortical veins are unremarkable. Anatomic variants: None Review of the MIP images confirms the above findings CT Brain Perfusion Findings: ASPECTS: 7/10 CBF (<30%) Volume: 29mL Perfusion (Tmax>6.0s) volume: 11mL Mismatch Volume: 11mL Infarction Location:Left MCA territory including the insular cortex and posterior left frontal lobe IMPRESSION: 1. 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 2. Left MCA branch vessels fill via collateral vessels. 3. Significant left MCA territory ischemia. T-max greater than 6 seconds is measured at 38 mL. 4. No significant core infarct. 5. Dense atherosclerotic changes within the cavernous internal carotid arteries bilaterally without focal stenosis.  6. Moderate tortuosity of the cervical internal carotid arteries bilaterally, likely secondary to hypertension. 7. Minimal atherosclerotic changes at the carotid bifurcations bilaterally without significant stenosis. These results were called by telephone at the time of interpretation on 08/04/2019 at 3:05 pm to provider Veryl Speak , who verbally acknowledged these results. Electronically Signed   By: San Morelle M.D.   On: 08/04/2019 15:28   DG CHEST PORT 1 VIEW  Result Date: 08/06/2019 CLINICAL DATA:  : Leukocytosis ,hx stroke EXAM: PORTABLE CHEST 1 VIEW COMPARISON:  Chest radiograph 08/04/2019 FINDINGS: Stable cardiomediastinal contours with enlarged heart size. There are a few scattered bilateral linear opacities likely reflecting atelectasis. No new focal consolidation. No pneumothorax or large pleural effusion. No acute finding in the visualized skeleton. IMPRESSION: Scattered minimal atelectasis.  No evidence of active disease. Electronically Signed   By: Audie Pinto M.D.   On: 08/06/2019 09:41   DG Chest Port 1 View  Result Date: 08/04/2019 CLINICAL DATA:  Intubation EXAM: PORTABLE CHEST 1 VIEW COMPARISON:  None. FINDINGS: Endotracheal tube tip is at the level of the clavicular heads. Mild cardiomegaly with bihilar prominence. No focal airspace consolidation or pulmonary edema. No pleural effusion. IMPRESSION: Endotracheal tube tip at the level of the clavicular heads. Electronically Signed   By: Ulyses Jarred M.D.   On: 08/04/2019 19:21   ECHOCARDIOGRAM COMPLETE  Result Date: 08/05/2019    ECHOCARDIOGRAM REPORT   Patient Name:   Jean Watts Date of Exam: 08/05/2019 Medical Rec #:  [id number]  Height:       64.0 in Accession #:    [id number]      Weight:       222.2 lb Date of Birth:  01-Aug-1940       BSA:          2.05 m Patient Age:    74 years        BP:           133/62 mmHg Patient Gender: F               HR:           90 bpm. Exam Location:  Inpatient Procedure: 2D Echo  Indications:    Stroke 434.91/I163.9  History:        Patient has no prior history of Echocardiogram examinations.                 Risk Factors:Hypertension, Diabetes and Dyslipidemia.  Sonographer:    Clayton Lefort RDCS (AE) Referring Phys: [id number] Radford Pax  Sonographer Comments: Patient is morbidly obese. IMPRESSIONS  1. Left ventricular ejection fraction, by estimation, is 60 to 65%. The left ventricle has normal function. The left ventrical has no regional wall motion abnormalities. There is mildly increased concentric left ventricular hypertrophy. Left ventricular  diastolic parameters are consistent with Grade I diastolic dysfunction (impaired relaxation).  2. Right ventricular systolic function is normal. The right ventricular size is normal. There is mildly elevated pulmonary artery systolic pressure.  3. No left atrial/left atrial appendage thrombus was detected.  4. The mitral valve is normal in structure and function. trivial mitral valve regurgitation. No evidence of mitral stenosis.  5. The aortic valve is tricuspid. Aortic valve regurgitation is not visualized. Mild aortic valve sclerosis is present, with no evidence of aortic valve stenosis.  6. The inferior vena cava is normal in size with greater than 50% respiratory variability, suggesting right atrial pressure of 3 mmHg. FINDINGS  Left Ventricle: Left ventricular ejection fraction, by estimation, is 60 to 65%. The left ventricle has normal function. The left ventricle has no regional wall motion abnormalities. The left ventricular internal cavity size was normal in size. There is  mildly increased concentric left ventricular hypertrophy. Left ventricular diastolic parameters are consistent with Grade I diastolic dysfunction (impaired relaxation). Right Ventricle: The right ventricular size is normal. No increase in right ventricular wall thickness. Right ventricular systolic function is normal. There is mildly elevated pulmonary artery  systolic pressure. The tricuspid regurgitant velocity is 3.01  m/s, and with an assumed right atrial pressure of 3 mmHg, the estimated right ventricular systolic pressure is 123XX123 mmHg. Left Atrium: Left atrial size was normal in size. Right Atrium: Right atrial size was normal in size. Pericardium: There is no evidence of pericardial effusion. Mitral Valve: The mitral valve is normal in structure and function. There is mild thickening of the mitral valve leaflet(s). Normal mobility of the mitral valve leaflets. Mild mitral annular calcification. Trivial mitral valve regurgitation. No evidence of mitral valve stenosis. MV peak gradient, 8.8 mmHg. The mean mitral valve gradient is 3.0 mmHg. Tricuspid Valve: The tricuspid valve is normal in structure. Tricuspid valve regurgitation is mild . No evidence of tricuspid stenosis. Aortic Valve: The aortic valve is tricuspid. . There is severe thickening and severe calcifcation of the aortic valve. Aortic valve regurgitation is not visualized. Mild aortic valve sclerosis is present, with no evidence of aortic valve stenosis. There is severe thickening of the aortic valve. There is  severe calcifcation of the aortic valve. Aortic valve mean gradient measures 10.0 mmHg. Aortic valve peak gradient measures 19.7 mmHg. Aortic valve area, by VTI measures 1.64 cm. Pulmonic Valve: The pulmonic valve was normal in structure. Pulmonic valve regurgitation is not visualized. No evidence of pulmonic stenosis. Aorta: The aortic root is normal in size and structure. Venous: The inferior vena cava is normal in size with greater than 50% respiratory variability, suggesting right atrial pressure of 3 mmHg. The inferior vena cava and the hepatic vein show a normal flow pattern. IAS/Shunts: No atrial level shunt detected by color flow Doppler.  LEFT VENTRICLE PLAX 2D LVIDd:         4.30 cm  Diastology LVIDs:         3.01 cm  LV e' lateral:   8.27 cm/s LV PW:         1.28 cm  LV E/e' lateral: 9.9  LV IVS:        1.15 cm  LV e' medial:    7.94 cm/s LVOT diam:     1.90 cm  LV E/e' medial:  10.3 LV SV:         69.75 ml LV SV Index:   21.89 LVOT Area:     2.84 cm  RIGHT VENTRICLE             IVC RV Basal diam:  2.51 cm     IVC diam: 1.74 cm RV S prime:     18.80 cm/s TAPSE (M-mode): 2.4 cm LEFT ATRIUM             Index       RIGHT ATRIUM           Index LA diam:        3.20 cm 1.56 cm/m  RA Area:     11.10 cm LA Vol (A2C):   45.8 ml 22.39 ml/m RA Volume:   20.70 ml  10.12 ml/m LA Vol (A4C):   57.6 ml 28.15 ml/m LA Biplane Vol: 52.3 ml 25.56 ml/m  AORTIC VALVE AV Area (Vmax):    1.65 cm AV Area (Vmean):   1.57 cm AV Area (VTI):     1.64 cm AV Vmax:           222.00 cm/s AV Vmean:          151.000 cm/s AV VTI:            0.425 m AV Peak Grad:      19.7 mmHg AV Mean Grad:      10.0 mmHg LVOT Vmax:         129.00 cm/s LVOT Vmean:        83.500 cm/s LVOT VTI:          0.246 m LVOT/AV VTI ratio: 0.58  AORTA Ao Root diam: 2.90 cm Ao Asc diam:  3.20 cm MITRAL VALVE                         TRICUSPID VALVE MV Area (PHT): 3.83 cm              TR Peak grad:   36.2 mmHg MV Peak grad:  8.8 mmHg              TR Vmax:        301.00 cm/s MV Mean grad:  3.0 mmHg MV Vmax:       1.48 m/s  SHUNTS MV Vmean:      78.1 cm/s             Systemic VTI:  0.25 m MV Decel Time: 198 msec              Systemic Diam: 1.90 cm MV E velocity: 81.90 cm/s  103 cm/s MV A velocity: 129.00 cm/s 70.3 cm/s MV E/A ratio:  0.63        1.5 Jean Him MD Electronically signed by Jean Him MD Signature Date/Time: 08/05/2019/10:32:04 AM    Final    IR PERCUTANEOUS ART THROMBECTOMY/INFUSION INTRACRANIAL INC DIAG ANGIO  Result Date: 08/06/2019 INDICATION: New onset left-sided gaze, right-sided hemiparesis. Occluded left middle cerebral artery distal A1 segment extending into the superior and inferior divisions on CT angiogram of the head and neck. EXAM: 1. EMERGENT LARGE VESSEL OCCLUSION THROMBOLYSIS (anterior CIRCULATION)  COMPARISON:  CT angiogram of the head and neck of August 04, 2019. MEDICATIONS: Ancef 2 g IV antibiotic was administered within 1 hour of the procedure. ANESTHESIA/SEDATION: General anesthesia. CONTRAST:  Isovue 300 approximately 60 mL. FLUOROSCOPY TIME:  Fluoroscopy Time: 40 minutes 0 seconds (1801 mGy). COMPLICATIONS: None immediate. TECHNIQUE: Following a full explanation of the procedure along with the potential associated complications, an informed witnessed consent was obtained from the patient's daughter. The risks of intracranial hemorrhage of 10%, worsening neurological deficit, ventilator dependency, death and inability to revascularize were all reviewed in detail with the patient's daughter. The patient was then put under general anesthesia by the Department of Anesthesiology at [facility]. The right groin was prepped and draped in the usual sterile fashion. Thereafter using modified Seldinger technique, transfemoral access into the right common femoral artery was obtained without difficulty. Over a 0.035 inch guidewire a 8 [place] pinnacle sheath was inserted. Through this, and also over a 0.035 inch guidewire a 5 [place] JB 1 catheter was advanced to the aortic arch region and selectively positioned in the left common carotid artery. FINDINGS: The left common carotid arteriogram demonstrates the origin of the left external carotid artery and its major branches to be widely patent. The left internal carotid artery at the bulb is widely patent. The junction of the mid and the distal 1/3 demonstrates an O-shaped tortuosity without evidence of kinking. Distal to this the distal left cervical ICA is widely patent. The petrous, cavernous and supraclinoid segments are widely patent. The left middle cerebral artery demonstrates complete occlusion of the M1 segment, with complete occlusion of the superior division, and near complete occlusion of the inferior division. The left anterior cerebral artery  opacifies into the capillary and venous phases. PROCEDURE: The diagnostic JB 1 catheter in the left common carotid artery was exchanged over a 0.035 inch 300 cm Rosen exchange guidewire for an 8 [place] Pinnacle sheath in the right groin. This was connected to continuous heparinized saline infusion. Over the exchange guidewire, an 087 balloon guide catheter which had been prepped with 50% contrast and 50% heparinized saline infusion was advanced and positioned in the proximal 1/3 of the left internal carotid artery. The guidewire was removed. Good aspiration was obtained from the hub of the balloon guide catheter. A gentle control arteriogram performed through this demonstrated no change in the extracranial or intracranial circulation. At this time, over a 0.014 inch standard Synchro micro guidewire with a J configuration, the combination of an 021 [place] ProVue microcatheter, inside of a 6 French 132 cm Catalyst guide catheter was advanced to the supraclinoid left ICA without difficulty.  The micro guidewire was then gently manipulated using a torque device through the occluded left middle cerebral artery and the inferior division M2 M3 region followed by the microcatheter. The guidewire was removed. Good aspiration obtained from the hub of the microcatheter. A gentle control arteriogram performed through this demonstrated safe position of the tip of the microcatheter which was then connected to continuous heparinized saline infusion. A 4 mm x 40 mm Solitaire X retrieval device was advanced to the distal end of the microcatheter. The O ring on the delivery microcatheter was loosened. With slight forward gentle traction with the right hand on the delivery micro guidewire with the left hand the delivery microcatheter was retrieved deploying the retrieval device. A 6 [place] Catalyst guide catheter was then advanced into the proximal portion of the occluded left middle cerebral artery. Proximal flow arrest was then  initiated by inflating the balloon in the left internal carotid artery. With constant aspiration being applied with a Penumbra aspiration device for approximately 2 minutes, at the hub of the 6 [place] guide catheter which had been advanced into the proximal portion of the occlusion of the left middle cerebral artery, and with a 60 mL syringe at the hub of the balloon guide catheter, the combination of the retrieval device, the microcatheter and the 6 [place] Catalyst guide catheter was retrieved and removed. Following reversal of flow arrest, a control arteriogram performed through the balloon guide catheter demonstrated revascularization of the middle cerebral artery and the superior and the inferior divisions. However, there was a significant amount of filling defect still noted in the origins of the superior and inferior divisions. A second pass was then made again using the above combination. The micro guidewire was then advanced with the microcatheter to the M2 M3 region of the inferior division. After having confirmed safe location of the tip of the microcatheter, a 6 mm x 40 mm Solitaire X retrieval device was advanced to the distal end of the microcatheter. This was then deployed in the usual fashion as described above. A control arteriogram performed through the 6 [place] Catalyst guide catheter in supraclinoid left ICA demonstrated a TICI 3 revascularization. With proximal flow arrest in the left internal carotid artery by inflating the balloon of the balloon guide catheter, and constant aspiration being applied through the 6 [place] Catalyst guide catheter imbedded in the nearly occlusive superior and inferior divisions over 2 minutes with a Penumbra aspiration, and with a 60 mL syringe at the hub of the balloon guide catheter, the combination of the retrieval device, the microcatheter and the 6 [place] Catalyst guide catheter were retrieved and removed. A clot was noted imbedded in the retrieval  device, and also in the aspirate. Following reversal of flow arrest, a control arteriogram performed through the balloon guide catheter in the left internal carotid proximally demonstrated a TICI 3 revascularization. The anterior cerebral artery remained widely patent. During the procedure, no evidence of mass effect, midline shift or of extravasation was seen. A final control arteriogram performed through the balloon guide catheter in the left common carotid artery demonstrated the left internal carotid artery to be widely patent without any evidence of spasms or dissections. A flat panel CT of the brain demonstrated no evidence of mass effect, midline shift or intracranial hemorrhage. The balloon guide catheter was retrieved and removed. The 8 [place] Pinnacle sheath in the right groin was then removed with the successful application of an 8 [place] Angio-Seal closure device for hemostasis. Distal pulses remained  Dopplerable in the dorsalis pedis, and the posterior tibial regions bilaterally. The patient's general anesthesia was then reversed, and the patient was extubated without difficulty. Patient had difficulty with expression, and was able to move her left arm and leg spontaneously. She was then transferred to the neuro ICU to continue with post thrombectomy management. IMPRESSION: Status post endovascular complete revascularization of occluded left middle cerebral artery and the superior and inferior divisions proximally with 1 pass with the Solitaire 4 mm x 40 mm X retrieval device, and 1 pass with the 6 mm x 40 mm Solitaire X retrieval device with a Penumbra aspiration achieving a TICI 3 revascularization. PLAN: Follow-up in the clinic 4 weeks post discharge. Electronically Signed   By: Jean Watts M.D.   On: 08/05/2019 16:19   CT HEAD CODE STROKE WO CONTRAST  Result Date: 08/04/2019 CLINICAL DATA:  Code stroke. Wake up stroke. Patient awoke with expressive aphasia. Unable to identify objects.  Right-sided arm weakness and lip droop. EXAM: CT HEAD WITHOUT CONTRAST TECHNIQUE: Contiguous axial images were obtained from the base of the skull through the vertex without intravenous contrast. COMPARISON:  None. FINDINGS: Brain: There is diffuse loss of gray-white differentiation the posterior aspect of the left insular ribbon. Focal nonhemorrhagic infarct is also present in the posterior left frontal lobe, best seen on image 20 of series 2. Moderate diffuse periventricular and subcortical white matter hypoattenuation is present bilaterally. No other focal cortical abnormalities are present. The basal ganglia are intact. There is of ischemia are noted within the thalami bilaterally, likely remote. The brainstem is normal. Moderate cerebellar atrophy is noted. The ventricles are proportionate to the degree of atrophy. No significant extraaxial fluid collection is present. Vascular: Dense atherosclerotic calcifications are present within the cavernous internal carotid arteries bilaterally. Asymmetric hyperdense left M1 segment is noted. This extends to the bifurcation. Vascular calcifications are noted at the dural margin of the vertebral arteries bilaterally. Skull: Calvarium is intact. No focal lytic or blastic lesions are present. Sinuses/Orbits: The paranasal sinuses and mastoid air cells are clear. Calcified right globe is noted. The left globe and orbit are normal. ASPECTS [facility] Stroke Program Early CT Score) - Ganglionic level infarction (caudate, lentiform nuclei, internal capsule, insula, M1-M3 cortex): 5/7 - Supraganglionic infarction (M4-M6 cortex): 1/3 Total score (0-10 with 10 being normal): 7/10 IMPRESSION: 1. Acute nonhemorrhagic infarcts involving the posterior left insular ribbon and posterior super ganglionic left frontal lobe. 2. Asymmetric hyperdense left M1 segment suggesting thrombus. 3. Advanced atrophy and diffuse white matter disease likely reflects the sequela of chronic microvascular  ischemia. 4. Ischemic changes of the thalami bilaterally are likely remote. 5. ASPECTS is 7/10 If patient was last seen well within the last 24 hours, CT perfusion may be useful for further evaluation of brain ischemia versus infarct. These results were called by telephone at the time of interpretation on 08/04/2019 at 2:08 pm to provider Veryl Speak , who verbally acknowledged these results. Electronically Signed   By: San Morelle M.D.   On: 08/04/2019 14:12   VAS [place] LOWER EXTREMITY VENOUS (DVT)  Result Date: 08/06/2019  Lower Venous DVTStudy Indications: Stroke.  Comparison Study: no prior Performing Technologist: Abram Sander RVS  Examination Guidelines: A complete evaluation includes B-mode imaging, spectral Doppler, color Doppler, and power Doppler as needed of all accessible portions of each vessel. Bilateral testing is considered an integral part of a complete examination. Limited examinations for reoccurring indications may be performed as noted. The reflux portion of the exam  is performed with the patient in reverse Trendelenburg.  +---------+---------------+---------+-----------+----------+--------------+ RIGHT    CompressibilityPhasicitySpontaneityPropertiesThrombus Aging +---------+---------------+---------+-----------+----------+--------------+ CFV      Full           Yes      Yes                                 +---------+---------------+---------+-----------+----------+--------------+ SFJ      Full                                                        +---------+---------------+---------+-----------+----------+--------------+ FV Prox  Full                                                        +---------+---------------+---------+-----------+----------+--------------+ FV Mid   Full                                                        +---------+---------------+---------+-----------+----------+--------------+ FV DistalFull                                                         +---------+---------------+---------+-----------+----------+--------------+ PFV      Full                                                        +---------+---------------+---------+-----------+----------+--------------+ POP      Full           Yes      Yes                                 +---------+---------------+---------+-----------+----------+--------------+ PTV      Full                                                        +---------+---------------+---------+-----------+----------+--------------+ PERO     Full                                                        +---------+---------------+---------+-----------+----------+--------------+   +---------+---------------+---------+-----------+----------+--------------+ LEFT     CompressibilityPhasicitySpontaneityPropertiesThrombus Aging +---------+---------------+---------+-----------+----------+--------------+ CFV      Full           Yes      Yes                                 +---------+---------------+---------+-----------+----------+--------------+  SFJ      Full                                                        +---------+---------------+---------+-----------+----------+--------------+ FV Prox  Full                                                        +---------+---------------+---------+-----------+----------+--------------+ FV Mid   Full                                                        +---------+---------------+---------+-----------+----------+--------------+ FV DistalFull                                                        +---------+---------------+---------+-----------+----------+--------------+ PFV      Full                                                        +---------+---------------+---------+-----------+----------+--------------+ POP      Full           Yes      Yes                                  +---------+---------------+---------+-----------+----------+--------------+ PTV      Full                                                        +---------+---------------+---------+-----------+----------+--------------+ PERO     Full                                                        +---------+---------------+---------+-----------+----------+--------------+     Summary: BILATERAL: - No evidence of deep vein thrombosis seen in the lower extremities, bilaterally.   *See table(s) above for measurements and observations. Electronically signed by Harold Barban MD on 08/06/2019 at 7:27:03 AM.    Final    HISTORY OF PRESENT ILLNESS Jean Watts is a 79 y.o. 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  Last known normal was 11:30 p.m. last night 08/03/2019 before she went to sleep.  The daughter noted that she overslept this morning and when trying to wake her up at 11:00 noted that she seemed confused, had difficulty getting words was weak on the right side.  EMS was called and patient taken to [facility].  Upon evaluation by EDP, code stroke was immediately activated.  Patient was taken for stat head CT which was concerning for hyperdense M1 and had ASPECTS of 7.  Tele-neurology was consulted who recommended stat CT angiogram and CT perfusion. She was not given tPA as she was out of the window.  EDP called neurology (Dr. Lorraine Lax) and was made aware of patient's findings.  Informed he will be notify once CTA and CT perfusion results available.  Patient noted to have left M1 occlusion and CT perfusion was favorable. Spoke with the EDP and requested immediate transfer to [facility] via EMS for emergent thrombectomy.  On arrival to [facility] ER, patient still  had mild expressive aphasia, weakness in the right arm and leg arm and leg, difficult to assess visual neglect. NIHSS 8, increased from 3 by teleneurology assessment. Baseline MRS 1. She was taken to IR.   HOSPITAL COURSE Jean Watts is a 79 y.o. female with history of hypertension, diabetes mellitus, hyperlipidemia, prior"stroke"in the right eye, neuropathy presenting to [facility] with aphasia and R sided weakness. Transferred to [facility] after hyperdense L M1 found for IR.   Stroke:   Small L MCA infarcts s/p IR w/ TICI3 revascularization L M1 occlusion, infarct embolic secondary to unknown source, suspicious for occult AF  Code Stroke CT head posterior L insular ribbon and posterior uper ganglionic L frontal lobe infarcts. Hyperdense L M1. Old ischemic changes B thalami. ASPECTS 7    CTA head & neck L MCA bifurcation occlusion with good collaterals. Dense B ICA cavernous atherosclerosis w/o focal stenosis; minimal atherosclerosis at bifurcaitons. Tortuous ICAs.   CT perfusion positive penumbra. No significant core.   Cerebral angio L M1 w/ proximal superior and inferior division occlusion w/ TICI3 revascularization   MRI  Small infarct L insular ribbon and medial L temporal lobe. Punctate subcortical L frontal lobe infarct. Small vessel disease.   MRA  Left MCA patent post IR   LE Doppler  Neg DVT  2D Echo EF 60-65%. No source of embolus   Loop recorder inserted 08/06/2019 ([facility]) to look for AF as possible source of stroke  LDL 129  HgbA1c 7.3  No antithrombotic prior to admission, now on ASA 325 and plavix 75. continue DAPT x 3 weeks then aspirin alone  Therapy recommendations:  HH PT, HH OT, HH SLP, 3N1  Disposition:  Return home.   Acute Respiratory Failure, resolved  Intubated for IR, unable to protect airway following  Extubated 2/10  Tolerating well  CCM signed off  Leukocytosis, resolved  WBC  8.3->17.6->5.1  Unknown etiology  UA negative  CXR - scattered minimal atx, NAD  F/u PCP  Hypertension  Home meds:  Metoprolol 25 bid, amlodipine-benazepril 5-10  Treated with on Cleviprex  BP low normal in hospital  Will hold home BP meds at d/c and f/u w/ PCP  Long-term BP goal normotensive  Hyperlipidemia  Home meds:  No statin  Now on lipitor 40  LDL 129, goal < 70  Continue statin at discharge  Diabetes type II Uncontrolled Diabetic Polyneuropathy  Home meds:  Metformin 1000 bid, humulin 40 am, 20 pm  HgbA1c 7.2, goal < 7.0  CBG monitoring  Diabetic  diet  Close PCP follow up   Dysphagia, resolved  Secondary to stroke  Speech on board following extubation  Cleared for heart healthy / carb modified thin liquid diet   Other Stroke Risk Factors  Advanced age  Obesity, Body mass index is 38.14 kg/m., recommend weight loss, diet and exercise as appropriate   Migraine  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 f/u PCP  DISCHARGE EXAM Blood pressure (!) 118/47, pulse 67, temperature 99.4 F (37.4 C), temperature source Oral, resp. rate 20, height 5\' 5"  (1.651 m), weight 100.8 kg, SpO2 97 %. General - Well nourished, well developed, in no apparent distress.  Ophthalmologic - fundi not visualized due to noncooperation.  Cardiovascular - Regular rhythm and rate.  Mental Status -  Level of arousal and orientation to time, place, and person were intact. Language exam showed intact comprehension, following commands, language fluent with intermittent paraphasic errors, able to repeat but with paraphasic errors on long or complex sentences, naming 4/5 Fund of Knowledge was assessed and was intact.  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 III, IV, VI - Extraocular movements intact,  however right corneal whitening not able to see right pupil. V - Facial sensation intact bilaterally. VII - mild right facial labial fold flattening VIII - Hearing & vestibular intact bilaterally. X - Palate elevates symmetrically. XI - Chin turning & shoulder shrug intact bilaterally. XII - Tongue protrusion intact.  Motor Strength - The patient's strength was normal in all extremities and pronator drift was absent.  Bulk was normal and fasciculations were absent.   Motor Tone - Muscle tone was assessed at the neck and appendages and was normal.  Reflexes - The patient's reflexes were symmetrical in all extremities and she had no pathological reflexes.  Sensory - Light touch, temperature/pinprick were assessed and were symmetrical.    Coordination - The patient had normal movements in the hands with no ataxia or dysmetria.  Tremor was absent.  Gait and Station - deferred.  Discharge Diet   Heart healthy / carb modified thin liquids  DISCHARGE PLAN  Disposition:  Return home  Home health PT, OT, SLP. 3N1   aspirin 325 mg daily and clopidogrel 75 mg daily for secondary stroke prevention for 3 weeks then ASPIRIN alone.  Ongoing stroke risk factor control by Primary Care Physician at time of discharge  Follow-up PCP Rosita Fire, MD in 2 weeks - f/u Hgb and WBC given wife in hospital variability.  Follow-up in [facility] in 4 weeks, office to schedule an appointment.   35 minutes were spent preparing discharge.  Rosalin Hawking, MD PhD Stroke Neurology 08/06/2019 10:12 PM

## 2019-08-06 NOTE — Progress Notes (Signed)
Physical Therapy Treatment Patient Details Name: Jean Watts MRN: [id number] DOB: 06/26/1940 Today's Date: 08/06/2019    History of Present Illness Levy Scotto is a 79 y.o. female with past medical history significant for hypertension, diabetes mellitus, hyperlipidemia, vision loss R eye, neuropathy presented to emergency department after her daughter found her aphasic and weak on the right side upon waking up this morning.  MRI showing multiple small acute infarcts on the Left side.    PT Comments    Pt progressing well towards their physical therapy goals. Session focused on progression of mobility and dynamic balance activities. Pt ambulating 350 feet with a cane at a min guard assist level. Demonstrates mild right sided weakness and decreased coordination in addition to cognitive deficits. D/c plan remains appropriate.    Follow Up Recommendations  Home health PT;Supervision/Assistance - 24 hour     Equipment Recommendations  None recommended by PT    Recommendations for Other Services       Precautions / Restrictions Precautions Precautions: Fall Restrictions Weight Bearing Restrictions: No    Mobility  Bed Mobility Overal bed mobility: Modified Independent                Transfers Overall transfer level: Needs assistance Equipment used: Straight cane Transfers: Sit to/from Stand Sit to Stand: Min guard         General transfer comment: Effortful, slow to rise. Min guard for safety  Ambulation/Gait Ambulation/Gait assistance: Min guard Gait Distance (Feet): 350 Feet Assistive device: Straight cane Gait Pattern/deviations: Step-through pattern;Decreased stance time - right;Wide base of support Gait velocity: slow Gait velocity interpretation: <1.8 ft/sec, indicate of risk for recurrent falls General Gait Details: Pt requiring min guard for stability, decreased RLE stance time resulting in left lateral lean during midstance   Stairs              Wheelchair Mobility    Modified Rankin (Stroke Patients Only) Modified Rankin (Stroke Patients Only) Pre-Morbid Rankin Score: No significant disability Modified Rankin: Moderately severe disability     Balance Overall balance assessment: Needs assistance   Sitting balance-Leahy Scale: Good     Standing balance support: Single extremity supported;During functional activity Standing balance-Leahy Scale: Fair                              Cognition Arousal/Alertness: Awake/alert Behavior During Therapy: WFL for tasks assessed/performed Overall Cognitive Status: Impaired/Different from baseline Area of Impairment: Problem solving;Memory                     Memory: Decreased short-term memory       Problem Solving: Slow processing;Decreased initiation;Difficulty sequencing General Comments: Pt pleasant, willing to participate in therapy; noted decreased short term memory; when asked to wayfind, would start naming all the room numbers and forget which specific one she was looking for.       Exercises Other Exercises Other Exercises: Dynamic balance: stops/starts, varying gait speeds, head turns    General Comments  HR peak 94 bpm      Pertinent Vitals/Pain Pain Assessment: Faces Faces Pain Scale: No hurt    Home Living                      Prior Function            PT Goals (current goals can now be found in the care plan section) Acute Rehab PT Goals Patient  Stated Goal: get home soon Potential to Achieve Goals: Good Progress towards PT goals: Progressing toward goals    Frequency    Min 4X/week      PT Plan Discharge plan needs to be updated    Co-evaluation              AM-PAC PT "6 Clicks" Mobility   Outcome Measure  Help needed turning from your back to your side while in a flat bed without using bedrails?: None Help needed moving from lying on your back to sitting on the side of a flat bed without  using bedrails?: None Help needed moving to and from a bed to a chair (including a wheelchair)?: A Little Help needed standing up from a chair using your arms (e.g., wheelchair or bedside chair)?: A Little Help needed to walk in hospital room?: A Little Help needed climbing 3-5 steps with a railing? : A Little 6 Click Score: 20    End of Session Equipment Utilized During Treatment: Gait belt Activity Tolerance: Patient tolerated treatment well Patient left: in bed;with call bell/phone within reach;with bed alarm set Nurse Communication: Mobility status PT Visit Diagnosis: Other abnormalities of gait and mobility (R26.89);Other symptoms and signs involving the nervous system (R29.898)     Time: [id number] PT Time Calculation (min) (ACUTE ONLY): 20 min  Charges:  $Therapeutic Activity: 8-22 mins                       Wyona Almas, PT, DPT Acute Rehabilitation Services Pager [phone] Office [phone]    Deno Etienne 08/06/2019, 10:09 AM

## 2019-08-06 NOTE — Evaluation (Signed)
Speech Language Pathology Evaluation Patient Details Name: Jean Watts MRN: [id number] DOB: [birth year] Today's Date: 08/06/2019 Time: [id number] SLP Time Calculation (min) (ACUTE ONLY): 22 min  Problem List:  Patient Active Problem List   Diagnosis Date Noted  . Status post stroke 08/04/2019  . Acute ischemic left MCA stroke ([place]) 08/04/2019  . Middle cerebral artery embolism, left 08/04/2019  . Colon cancer screening 03/16/2019   Past Medical History:  Past Medical History:  Diagnosis Date  . Dyspnea   . HLD (hyperlipidemia)   . HTN (hypertension)   . Neuropathy   . Type 2 diabetes mellitus with diabetic polyneuropathy ([place])   . Visual loss    right eye   Past Surgical History:  Past Surgical History:  Procedure Laterality Date  . ABDOMINAL HYSTERECTOMY    . BREAST BIOPSY  02/2019  . FLEXIBLE SIGMOIDOSCOPY N/A 06/04/2019   Procedure: FLEXIBLE SIGMOIDOSCOPY;  Surgeon: Daneil Dolin, MD;  Location: [facility];  Service: Endoscopy;  Laterality: N/A;  incomplete colonoscopy, poor prep  . IR PERCUTANEOUS ART THROMBECTOMY/INFUSION INTRACRANIAL INC DIAG ANGIO  08/04/2019  . LOOP RECORDER INSERTION N/A 08/06/2019   Procedure: LOOP RECORDER INSERTION;  Surgeon: Thompson Grayer, MD;  Location: [place] [facility] LAB;  Service: Cardiovascular;  Laterality: N/A;  . RADIOLOGY WITH ANESTHESIA N/A 08/04/2019   Procedure: IR WITH ANESTHESIA;  Surgeon: Radiologist, Medication, MD;  Location: [place];  Service: Radiology;  Laterality: N/A;   HPI:  Pt is a 79 y.o. 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 MRI of the brain revealed small volume acute infarct of the left insular ribbon and medial left temporal lobe. Punctate focus of acute ischemia within the subcortical  left frontal lobe.   Assessment / Plan / Recommendation Clinical Impression  Pt was seen for speech/language/cognition evaluation. She reported that she has been having difficulty with memory for at least the past 5 years. Per the pt, she lives with her daughter and her days consist of her watching TV. She stated that her daughter manages her finances and medications. With the pt's permission, the pt's daughter, [name], was contacted via phone to determine the pt's baseline compared to her current presentation. She reported that the pt's language skills do not seem fully back to baseline. 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 Pt produced fluent utterances but meaning was often reduced or her productions were unrelated to the question/conversational topic. Receptively, she was able to answer simple yes/no questions and follow 2-step commands but exhibited difficulty beyond this level of complexity. Cogntive-linguistic difficulty was noted in the areas of attention, reasoning, problem solving, and short-term memory. However, the impact of her language skills on her performance is considered. Skilled SLP services are clinically indicated at this time to target language. Pt, nursing, and family have been educated regarding this and have verbalized understanding as well as agreement with plan of care.     SLP Assessment  SLP Recommendation/Assessment: Patient needs continued Speech Lanaguage Pathology Services SLP Visit Diagnosis: Aphasia (R47.01)    Follow Up Recommendations  Home health SLP    Frequency and Duration min 2x/week  2 weeks      SLP Evaluation Cognition  Overall Cognitive Status: No family/caregiver present to determine baseline cognitive functioning Arousal/Alertness: Awake/alert Orientation  Level: Oriented X4 Attention: Focused;Sustained Focused Attention: Impaired Focused Attention Impairment: Verbal  complex Sustained Attention: Impaired Sustained Attention Impairment: Verbal complex Memory: Impaired Memory Impairment: Storage deficit;Retrieval deficit(Immediate: 3/3 with repetition; delayed: 3/3) Awareness: Impaired Awareness Impairment: Emergent impairment Problem Solving: Impaired Problem Solving Impairment: Verbal complex(3/5) Executive Function: Reasoning;Sequencing Sequencing: Impaired Sequencing Impairment: Verbal complex       Comprehension  Auditory Comprehension Overall Auditory Comprehension: Impaired Yes/No Questions: Impaired Basic Biographical Questions: (5/5) Complex Questions: (1/5) Paragraph Comprehension (via yes/no questions): (2/5) Commands: Impaired Two Step Basic Commands: (4/4) Multistep Basic Commands: (0/4)    Expression Expression Primary Mode of Expression: Verbal Verbal Expression Overall Verbal Expression: Impaired Initiation: No impairment Automatic Speech: Counting;Day of week;Month of year(WNL) Level of Generative/Spontaneous Verbalization: Conversation Repetition: Impaired Level of Impairment: Sentence level Naming: Impairment Responsive: (3/5) Confrontation: (10/10) Convergent: (3/5) Verbal Errors: Phonemic paraphasias   Oral / Motor  Oral Motor/Sensory Function Overall Oral Motor/Sensory Function: Within functional limits Motor Speech Overall Motor Speech: Appears within functional limits for tasks assessed Respiration: Within functional limits Phonation: Normal Resonance: Within functional limits Articulation: Impaired Level of Impairment: Conversation Intelligibility: Intelligible Motor Planning: Witnin functional limits Motor Speech Errors: Not applicable   Jean Watts, [place], [place] Office number [phone] Pager [place] 08/06/2019, 1:13 PM

## 2019-08-06 NOTE — Progress Notes (Signed)
Received from 4N via w/c accompanied by nurse.  Oriented to room and surroundings.  No complaints.

## 2019-08-06 NOTE — Plan of Care (Signed)
  Problem: Activity: Goal: Risk for activity intolerance will decrease Outcome: Adequate for Discharge   Problem: Nutrition: Goal: Adequate nutrition will be maintained Outcome: Adequate for Discharge   Problem: Coping: Goal: Level of anxiety will decrease Outcome: Adequate for Discharge   Problem: Education: Goal: Knowledge of General Education information will improve Description: Including pain rating scale, medication(s)/side effects and non-pharmacologic comfort measures Outcome: Adequate for Discharge

## 2019-08-06 NOTE — Progress Notes (Signed)
SLP Cancellation Note  Patient Details Name: Sahily Bundy MRN: [id number] DOB: 03/10/1941   Cancelled treatment:       Reason Eval/Treat Not Completed: Patient at procedure or test/unavailable(Pt is currently at cath lab. SLP will follow up subsequently)  Hensley Aziz I. Hardin Negus, [place], [place] Office number [phone] Pager [place] 08/06/2019, 10:51 AM

## 2019-08-06 NOTE — Plan of Care (Signed)
  Problem: Education: Goal: Knowledge of secondary prevention will improve Outcome: Progressing

## 2019-08-06 NOTE — Consult Note (Addendum)
ELECTROPHYSIOLOGY CONSULT NOTE  Patient ID: Jean Watts MRN: [id number], DOB/AGE: 03-Mar-1941   Admit date: 08/04/2019 Date of Consult: 08/06/2019  Primary Physician: Rosita Fire, MD Reason for Consultation: Cryptogenic stroke; recommendations regarding Implantable Loop Recorder  History of Present Illness EP has been asked to evaluate [place] for placement of an implantable loop recorder to monitor for atrial fibrillation by Dr Erlinda Hong. 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  The patient was admitted on 08/04/2019 with aphasia and right sided weakness.  Imaging demonstrated small L MCA infarcts felt to be embolic 2/2 unknown source.  She was intubated for airway protection during admission and has since been extubated.  she has undergone workup for stroke including echocardiogram and carotid imaging.  The patient has been monitored on telemetry which has demonstrated sinus rhythm with no arrhythmias.    Echocardiogram this admission demonstrated EF 60-65%, no RWMA, LA 32.  Lab work is reviewed.  Prior to admission, the patient denies chest pain, shortness of breath, dizziness, palpitations, or syncope.  They are recovering from their stroke with plans to return home at discharge.    Past Medical History:  Diagnosis Date  . Dyspnea   . HLD (hyperlipidemia)   . HTN (hypertension)   . Neuropathy   . Type 2 diabetes mellitus with diabetic polyneuropathy ([address])   . Visual loss    right eye     Surgical History:  Past Surgical History:  Procedure Laterality Date  . ABDOMINAL HYSTERECTOMY    . BREAST BIOPSY  02/2019  . FLEXIBLE SIGMOIDOSCOPY N/A 06/04/2019   Procedure: FLEXIBLE SIGMOIDOSCOPY;  Surgeon: Daneil Dolin, MD;  Location: [facility];  Service: Endoscopy;  Laterality: N/A;  incomplete colonoscopy, poor prep  . RADIOLOGY WITH ANESTHESIA N/A 08/04/2019   Procedure: IR WITH ANESTHESIA;  Surgeon: Radiologist,  Medication, MD;  Location: [facility];  Service: Radiology;  Laterality: N/A;     Medications Prior to Admission  Medication Sig Dispense Refill Last Dose  . albuterol (PROVENTIL) (2.5 MG/3ML) 0.083% nebulizer solution Inhale 2.5 mg into the lungs every 6 (six) hours as needed for wheezing or shortness of breath.    08/03/2019 at Unknown time  . amitriptyline (ELAVIL) 50 MG tablet Take 50 mg by mouth at bedtime.    08/03/2019 at 2300  . amLODipine-benazepril (LOTREL) 5-10 MG capsule Take 1 capsule by mouth daily.   08/03/2019 at 1800  . gabapentin (NEURONTIN) 400 MG capsule Take 400 mg by mouth 3 (three) times daily.    08/03/2019 at 2300  . HUMULIN N 100 UNIT/ML injection Inject 20-40 Units into the skin See admin instructions. Inject 40 units into the skin in the morning and 20 units in the evening   08/03/2019 at 1700  . meclizine (ANTIVERT) 25 MG tablet Take 25 mg by mouth at bedtime.    08/03/2019 at 2300  . metFORMIN (GLUCOPHAGE) 1000 MG tablet Take 1,000 mg by mouth 2 (two) times daily.    08/03/2019 at 1330  . metoprolol tartrate (LOPRESSOR) 25 MG tablet Take 25 mg by mouth 2 (two) times daily.    08/03/2019 at 1330    Inpatient Medications:  .  stroke: mapping our early stages of recovery book   Does not apply Once  . amitriptyline  50 mg Oral QHS  . aspirin  325 mg Oral Daily  . atorvastatin  40 mg Oral q1800  . Chlorhexidine Gluconate Cloth  6 each Topical  Daily  . clopidogrel  75 mg Oral Daily  . gabapentin  400 mg Oral TID  . insulin aspart  0-20 Units Subcutaneous TID WC & HS  . metoprolol tartrate  25 mg Oral BID  . mupirocin ointment  1 application Nasal BID  . pantoprazole  40 mg Oral Daily    Allergies: No Known Allergies  Social History   Socioeconomic History  . Marital status: Unknown    Spouse name: Not on file  . Number of children: Not on file  . Years of education: Not on file  . Highest education level: Not on file  Occupational History  . Not on file  Tobacco Use  .  Smoking status: Never Smoker  . Smokeless tobacco: Never Used  Substance and Sexual Activity  . Alcohol use: Never  . Drug use: Never  . Sexual activity: Not Currently  Other Topics Concern  . Not on file  Social History Narrative  . Not on file   Social Determinants of Health   Financial Resource Strain:   . Difficulty of Paying Living Expenses: Not on file  Food Insecurity:   . Worried About Charity fundraiser in the Last Year: Not on file  . Ran Out of Food in the Last Year: Not on file  Transportation Needs:   . Lack of Transportation (Medical): Not on file  . Lack of Transportation (Non-Medical): Not on file  Physical Activity:   . Days of Exercise per Week: Not on file  . Minutes of Exercise per Session: Not on file  Stress:   . Feeling of Stress : Not on file  Social Connections:   . Frequency of Communication with Friends and Family: Not on file  . Frequency of Social Gatherings with Friends and Family: Not on file  . Attends Religious Services: Not on file  . Active Member of Clubs or Organizations: Not on file  . Attends Archivist Meetings: Not on file  . Marital Status: Not on file  Intimate Partner Violence:   . Fear of Current or Ex-Partner: Not on file  . Emotionally Abused: Not on file  . Physically Abused: Not on file  . Sexually Abused: Not on file     Family History  Problem Relation Age of Onset  . Colon cancer Neg Hx   . Colon polyps Neg Hx       Review of Systems: All other systems reviewed and are otherwise negative except as noted above.  Physical Exam: Vitals:   08/05/19 2100 08/05/19 2228 08/05/19 2307 08/06/19 0328  BP:  (!) 125/54 (!) 136/47 (!) 112/55  Pulse: 62 (!) 59 62 (!) 57  Resp: 18 18 19 18   Temp:  98.4 F (36.9 C) 97.9 F (36.6 C) 98.4 F (36.9 C)  TempSrc:  Oral Oral Oral  SpO2: 98% 97% 99% 96%  Weight:      Height:        GEN- The patient is well appearing, alert and oriented x 3 today.   Head-  normocephalic, atraumatic Eyes-  Sclera clear, conjunctiva pink Ears- hearing intact Oropharynx- clear Neck- supple Lungs- Clear to ausculation bilaterally, normal work of breathing Heart- Regular rate and rhythm, no murmurs, rubs or gallops  GI- soft, NT, ND, + BS Extremities- no clubbing, cyanosis, or edema MS- no significant deformity or atrophy Skin- no rash or lesion Psych- euthymic mood, full affect   Labs:   Lab Results  Component Value Date   WBC  17.6 (H) 08/06/2019   HGB 11.2 (L) 08/06/2019   HCT 32.2 (L) 08/06/2019   MCV 91.2 08/06/2019   PLT 381 08/06/2019    Recent Labs  Lab 08/04/19 1346 08/04/19 1931 08/06/19 0315  NA 140   < > 138  K 4.1   < > 4.4  CL 107   < > 108  CO2 25   < > 20*  BUN 14   < > 9  CREATININE 0.85   < > 0.83  CALCIUM 9.1   < > 8.7*  PROT 7.2  --   --   BILITOT 0.4  --   --   ALKPHOS 111  --   --   ALT 12  --   --   AST 14*  --   --   GLUCOSE 100*   < > 177*   < > = values in this interval not displayed.     Radiology/Studies: CT Angio Head W or Wo Contrast  Result Date: 08/04/2019 CLINICAL DATA:  Right-sided weakness and facial droop. Expressive aphasia. Patient awoke with symptoms. Last seen normal at 11 o'clock p.m. last night EXAM: CT ANGIOGRAPHY HEAD AND NECK CT PERFUSION BRAIN TECHNIQUE: Multidetector CT imaging of the head and neck was performed using the standard protocol during bolus administration of intravenous contrast. Multiplanar CT image reconstructions and MIPs were obtained to evaluate the vascular anatomy. Carotid stenosis measurements (when applicable) are obtained utilizing NASCET criteria, using the distal internal carotid diameter as the denominator. Multiphase CT imaging of the brain was performed following IV bolus contrast injection. Subsequent parametric perfusion maps were calculated using RAPID software. CONTRAST:  154mL OMNIPAQUE IOHEXOL 350 MG/ML SOLN COMPARISON:  CT head without contrast of the same day.  FINDINGS: CTA NECK FINDINGS Aortic arch: A 3 vessel arch configuration is present. Atherosclerotic calcifications are present at the arch. The proximal descending aorta is enlarged, measuring 3.4 cm. No significant stenosis is present at the great vessel origins. Right carotid system: The right common carotid artery is tortuous without significant stenosis. Minimal atherosclerotic changes are noted at the right carotid bifurcation. There is no significant stenosis. Moderate tortuosity is present in the more distal cervical right ICA without significant stenosis. Left carotid system: The left common carotid artery is tortuous without significant stenosis. Atherosclerotic changes are noted at the bifurcation. There is no significant stenosis. Moderate tortuosity is present in the cervical left ICA without significant stenosis. Vertebral arteries: The left vertebral artery is the dominant vessel. Both vertebral arteries originate from the subclavian arteries without significant stenosis. There is no significant stenosis of either vertebral artery in neck. Skeleton: Multilevel degenerative changes are present cervical spine. There is chronic loss of disc height at C5-6, C6-7, and C7-T1. Slight degenerative anterolisthesis is present at C2-3, C3-4, and C4-5. The patient is edentulous. No focal lytic or blastic lesions are present. Other neck: The soft tissues the neck demonstrate heterogeneous appearance of the thyroid without a dominant lesion. Salivary glands are within normal limits. No significant cervical adenopathy is present. Upper chest: Thoracic inlet is normal.  Lung apices are clear. Review of the MIP images confirms the above findings CTA HEAD FINDINGS Anterior circulation: Dense atherosclerotic calcifications are present throughout the cavernous internal carotid arteries bilaterally without a significant stenosis relative to the more distal vessels. The ICA termini are patent. A1 segments are normal. The  anterior communicating artery is patent. The right M1 segment is normal. The left M1 is occluded at the bifurcation. Left MCA  branch vessels fill via collaterals. Right MCA and bilateral ACA branch vessels are unremarkable. Posterior circulation: The left vertebral artery is the dominant vessel. PICA origins are visualized and normal. Vertebrobasilar junction is normal. The basilar artery is within normal limits. Both posterior cerebral arteries originate from the basilar tip. There is some attenuation of distal PCA branch vessels. Asymmetric attenuation is present on the right. No significant proximal stenosis or occlusion is present. Venous sinuses: The dural sinuses are patent. The straight sinus and deep cerebral veins are intact. Cortical veins are unremarkable. Anatomic variants: None Review of the MIP images confirms the above findings CT Brain Perfusion Findings: ASPECTS: 7/10 CBF (<30%) Volume: 4mL Perfusion (Tmax>6.0s) volume: 68mL Mismatch Volume: 30mL Infarction Location:Left MCA territory including the insular cortex and posterior left frontal lobe IMPRESSION: 1. 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 2. Left MCA branch vessels fill via collateral vessels. 3. Significant left MCA territory ischemia. T-max greater than 6 seconds is measured at 38 mL. 4. No significant core infarct. 5. Dense atherosclerotic changes within the cavernous internal carotid arteries bilaterally without focal stenosis. 6. Moderate tortuosity of the cervical internal carotid arteries bilaterally, likely secondary to hypertension. 7. Minimal atherosclerotic changes at the carotid bifurcations bilaterally without significant stenosis. These results were called by telephone at the time of interpretation on 08/04/2019 at 3:05 pm to provider Veryl Speak , who verbally acknowledged these results. Electronically Signed   By: San Morelle M.D.   On: 08/04/2019 15:28   DG Abd 1 View  Result Date:  08/05/2019 CLINICAL DATA:  Assess for foreign body for MRI EXAM: ABDOMEN - 1 VIEW COMPARISON:  None. FINDINGS: Nonspecific gaseous distention of the colon. No high-grade obstructive small bowel gas pattern is seen. No radiopaque foreign bodies are evident. No suspicious calcifications. Degenerative changes present in the spine and pelvis. IMPRESSION: No radiopaque foreign bodies are evident. Nonspecific gaseous distention of the colon. No high-grade obstructive small bowel gas pattern. Electronically Signed   By: Lovena Le M.D.   On: 08/05/2019 02:25   CT Angio Neck W and/or Wo Contrast  Result Date: 08/04/2019 CLINICAL DATA:  Right-sided weakness and facial droop. Expressive aphasia. Patient awoke with symptoms. Last seen normal at 11 o'clock p.m. last night EXAM: CT ANGIOGRAPHY HEAD AND NECK CT PERFUSION BRAIN TECHNIQUE: Multidetector CT imaging of the head and neck was performed using the standard protocol during bolus administration of intravenous contrast. Multiplanar CT image reconstructions and MIPs were obtained to evaluate the vascular anatomy. Carotid stenosis measurements (when applicable) are obtained utilizing NASCET criteria, using the distal internal carotid diameter as the denominator. Multiphase CT imaging of the brain was performed following IV bolus contrast injection. Subsequent parametric perfusion maps were calculated using RAPID software. CONTRAST:  116mL OMNIPAQUE IOHEXOL 350 MG/ML SOLN COMPARISON:  CT head without contrast of the same day. FINDINGS: CTA NECK FINDINGS Aortic arch: A 3 vessel arch configuration is present. Atherosclerotic calcifications are present at the arch. The proximal descending aorta is enlarged, measuring 3.4 cm. No significant stenosis is present at the great vessel origins. Right carotid system: The right common carotid artery is tortuous without significant stenosis. Minimal atherosclerotic changes are noted at the right carotid bifurcation. There is no  significant stenosis. Moderate tortuosity is present in the more distal cervical right ICA without significant stenosis. Left carotid system: The left common carotid artery is tortuous without significant stenosis. Atherosclerotic changes are noted at the bifurcation. There is no significant stenosis. Moderate tortuosity is  present in the cervical left ICA without significant stenosis. Vertebral arteries: The left vertebral artery is the dominant vessel. Both vertebral arteries originate from the subclavian arteries without significant stenosis. There is no significant stenosis of either vertebral artery in neck. Skeleton: Multilevel degenerative changes are present cervical spine. There is chronic loss of disc height at C5-6, C6-7, and C7-T1. Slight degenerative anterolisthesis is present at C2-3, C3-4, and C4-5. The patient is edentulous. No focal lytic or blastic lesions are present. Other neck: The soft tissues the neck demonstrate heterogeneous appearance of the thyroid without a dominant lesion. Salivary glands are within normal limits. No significant cervical adenopathy is present. Upper chest: Thoracic inlet is normal.  Lung apices are clear. Review of the MIP images confirms the above findings CTA HEAD FINDINGS Anterior circulation: Dense atherosclerotic calcifications are present throughout the cavernous internal carotid arteries bilaterally without a significant stenosis relative to the more distal vessels. The ICA termini are patent. A1 segments are normal. The anterior communicating artery is patent. The right M1 segment is normal. The left M1 is occluded at the bifurcation. Left MCA branch vessels fill via collaterals. Right MCA and bilateral ACA branch vessels are unremarkable. Posterior circulation: The left vertebral artery is the dominant vessel. PICA origins are visualized and normal. Vertebrobasilar junction is normal. The basilar artery is within normal limits. Both posterior cerebral arteries  originate from the basilar tip. There is some attenuation of distal PCA branch vessels. Asymmetric attenuation is present on the right. No significant proximal stenosis or occlusion is present. Venous sinuses: The dural sinuses are patent. The straight sinus and deep cerebral veins are intact. Cortical veins are unremarkable. Anatomic variants: None Review of the MIP images confirms the above findings CT Brain Perfusion Findings: ASPECTS: 7/10 CBF (<30%) Volume: 26mL Perfusion (Tmax>6.0s) volume: 70mL Mismatch Volume: 44mL Infarction Location:Left MCA territory including the insular cortex and posterior left frontal lobe IMPRESSION: 1. 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 2. Left MCA branch vessels fill via collateral vessels. 3. Significant left MCA territory ischemia. T-max greater than 6 seconds is measured at 38 mL. 4. No significant core infarct. 5. Dense atherosclerotic changes within the cavernous internal carotid arteries bilaterally without focal stenosis. 6. Moderate tortuosity of the cervical internal carotid arteries bilaterally, likely secondary to hypertension. 7. Minimal atherosclerotic changes at the carotid bifurcations bilaterally without significant stenosis. These results were called by telephone at the time of interpretation on 08/04/2019 at 3:05 pm to provider Veryl Speak , who verbally acknowledged these results. Electronically Signed   By: San Morelle M.D.   On: 08/04/2019 15:28   MR ANGIO HEAD WO CONTRAST  Result Date: 08/05/2019 CLINICAL DATA:  Right-sided weakness and aphasia EXAM: MRI HEAD WITHOUT CONTRAST MRA HEAD WITHOUT CONTRAST TECHNIQUE: Multiplanar, multiecho pulse sequences of the brain and surrounding structures were obtained without intravenous contrast. Angiographic images of the head were obtained using MRA technique without contrast. COMPARISON:  None. FINDINGS: MRI HEAD FINDINGS Brain: There is an area of abnormal diffusion  restriction within the left insular ribbon and medial left temporal lobe. Punctate focus of diffusion restriction within the subcortical left frontal lobe (series 9, image 79). No contralateral or infratentorial ischemia. Early confluent hyperintense T2-weighted signal of the periventricular and deep white matter, most commonly due to chronic ischemic microangiopathy. Mild generalized atrophy. Two chronic micro hemorrhagic foci in the left temporal lobe. Normal midline structures. Vascular: Normal flow voids. Skull and upper cervical spine: Normal marrow signal. Sinuses/Orbits: Right phthisis  bulbi. Paranasal sinuses and mastoids are clear. Other: None MRA HEAD FINDINGS POSTERIOR CIRCULATION: --Vertebral arteries: Normal V4 segments. --Posterior inferior cerebellar arteries (PICA): Patent origins from the vertebral arteries. --Anterior inferior cerebellar arteries (AICA): Patent origins from the basilar artery. --Basilar artery: Normal. --Superior cerebellar arteries: Normal. --Posterior cerebral arteries: Normal. Both originate from the basilar artery. Posterior communicating arteries (p-comm) are diminutive or absent. ANTERIOR CIRCULATION: --Intracranial internal carotid arteries: Normal. --Anterior cerebral arteries (ACA): Normal. Both A1 segments are present. Patent anterior communicating artery (a-comm). --Middle cerebral arteries (MCA): Normal. IMPRESSION: 1. 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 2. Punctate focus of acute ischemia within the subcortical left frontal lobe. 3. Normal intracranial MRA. 4. Chronic ischemic microangiopathy. Electronically Signed   By: Ulyses Jarred M.D.   On: 08/05/2019 03:31   MR BRAIN WO CONTRAST  Result Date: 08/05/2019 CLINICAL DATA:  Right-sided weakness and aphasia EXAM: MRI HEAD WITHOUT CONTRAST MRA HEAD WITHOUT CONTRAST TECHNIQUE: Multiplanar, multiecho pulse sequences of the brain and surrounding structures were obtained  without intravenous contrast. Angiographic images of the head were obtained using MRA technique without contrast. COMPARISON:  None. FINDINGS: MRI HEAD FINDINGS Brain: There is an area of abnormal diffusion restriction within the left insular ribbon and medial left temporal lobe. Punctate focus of diffusion restriction within the subcortical left frontal lobe (series 9, image 79). No contralateral or infratentorial ischemia. Early confluent hyperintense T2-weighted signal of the periventricular and deep white matter, most commonly due to chronic ischemic microangiopathy. Mild generalized atrophy. Two chronic micro hemorrhagic foci in the left temporal lobe. Normal midline structures. Vascular: Normal flow voids. Skull and upper cervical spine: Normal marrow signal. Sinuses/Orbits: Right phthisis bulbi. Paranasal sinuses and mastoids are clear. Other: None MRA HEAD FINDINGS POSTERIOR CIRCULATION: --Vertebral arteries: Normal V4 segments. --Posterior inferior cerebellar arteries (PICA): Patent origins from the vertebral arteries. --Anterior inferior cerebellar arteries (AICA): Patent origins from the basilar artery. --Basilar artery: Normal. --Superior cerebellar arteries: Normal. --Posterior cerebral arteries: Normal. Both originate from the basilar artery. Posterior communicating arteries (p-comm) are diminutive or absent. ANTERIOR CIRCULATION: --Intracranial internal carotid arteries: Normal. --Anterior cerebral arteries (ACA): Normal. Both A1 segments are present. Patent anterior communicating artery (a-comm). --Middle cerebral arteries (MCA): Normal. IMPRESSION: 1. 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 2. Punctate focus of acute ischemia within the subcortical left frontal lobe. 3. Normal intracranial MRA. 4. Chronic ischemic microangiopathy. Electronically Signed   By: Ulyses Jarred M.D.   On: 08/05/2019 03:31   CT CEREBRAL PERFUSION W CONTRAST  Result Date:  08/04/2019 CLINICAL DATA:  Right-sided weakness and facial droop. Expressive aphasia. Patient awoke with symptoms. Last seen normal at 11 o'clock p.m. last night EXAM: CT ANGIOGRAPHY HEAD AND NECK CT PERFUSION BRAIN TECHNIQUE: Multidetector CT imaging of the head and neck was performed using the standard protocol during bolus administration of intravenous contrast. Multiplanar CT image reconstructions and MIPs were obtained to evaluate the vascular anatomy. Carotid stenosis measurements (when applicable) are obtained utilizing NASCET criteria, using the distal internal carotid diameter as the denominator. Multiphase CT imaging of the brain was performed following IV bolus contrast injection. Subsequent parametric perfusion maps were calculated using RAPID software. CONTRAST:  186mL OMNIPAQUE IOHEXOL 350 MG/ML SOLN COMPARISON:  CT head without contrast of the same day. FINDINGS: CTA NECK FINDINGS Aortic arch: A 3 vessel arch configuration is present. Atherosclerotic calcifications are present at the arch. The proximal descending aorta is enlarged,  measuring 3.4 cm. No significant stenosis is present at the great vessel origins. Right carotid system: The right common carotid artery is tortuous without significant stenosis. Minimal atherosclerotic changes are noted at the right carotid bifurcation. There is no significant stenosis. Moderate tortuosity is present in the more distal cervical right ICA without significant stenosis. Left carotid system: The left common carotid artery is tortuous without significant stenosis. Atherosclerotic changes are noted at the bifurcation. There is no significant stenosis. Moderate tortuosity is present in the cervical left ICA without significant stenosis. Vertebral arteries: The left vertebral artery is the dominant vessel. Both vertebral arteries originate from the subclavian arteries without significant stenosis. There is no significant stenosis of either vertebral artery in neck.  Skeleton: Multilevel degenerative changes are present cervical spine. There is chronic loss of disc height at C5-6, C6-7, and C7-T1. Slight degenerative anterolisthesis is present at C2-3, C3-4, and C4-5. The patient is edentulous. No focal lytic or blastic lesions are present. Other neck: The soft tissues the neck demonstrate heterogeneous appearance of the thyroid without a dominant lesion. Salivary glands are within normal limits. No significant cervical adenopathy is present. Upper chest: Thoracic inlet is normal.  Lung apices are clear. Review of the MIP images confirms the above findings CTA HEAD FINDINGS Anterior circulation: Dense atherosclerotic calcifications are present throughout the cavernous internal carotid arteries bilaterally without a significant stenosis relative to the more distal vessels. The ICA termini are patent. A1 segments are normal. The anterior communicating artery is patent. The right M1 segment is normal. The left M1 is occluded at the bifurcation. Left MCA branch vessels fill via collaterals. Right MCA and bilateral ACA branch vessels are unremarkable. Posterior circulation: The left vertebral artery is the dominant vessel. PICA origins are visualized and normal. Vertebrobasilar junction is normal. The basilar artery is within normal limits. Both posterior cerebral arteries originate from the basilar tip. There is some attenuation of distal PCA branch vessels. Asymmetric attenuation is present on the right. No significant proximal stenosis or occlusion is present. Venous sinuses: The dural sinuses are patent. The straight sinus and deep cerebral veins are intact. Cortical veins are unremarkable. Anatomic variants: None Review of the MIP images confirms the above findings CT Brain Perfusion Findings: ASPECTS: 7/10 CBF (<30%) Volume: 72mL Perfusion (Tmax>6.0s) volume: 43mL Mismatch Volume: 26mL Infarction Location:Left MCA territory including the insular cortex and posterior left frontal  lobe IMPRESSION: 1. 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 2. Left MCA branch vessels fill via collateral vessels. 3. Significant left MCA territory ischemia. T-max greater than 6 seconds is measured at 38 mL. 4. No significant core infarct. 5. Dense atherosclerotic changes within the cavernous internal carotid arteries bilaterally without focal stenosis. 6. Moderate tortuosity of the cervical internal carotid arteries bilaterally, likely secondary to hypertension. 7. Minimal atherosclerotic changes at the carotid bifurcations bilaterally without significant stenosis. These results were called by telephone at the time of interpretation on 08/04/2019 at 3:05 pm to provider Veryl Speak , who verbally acknowledged these results. Electronically Signed   By: San Morelle M.D.   On: 08/04/2019 15:28   DG Chest Port 1 View  Result Date: 08/04/2019 CLINICAL DATA:  Intubation EXAM: PORTABLE CHEST 1 VIEW COMPARISON:  None. FINDINGS: Endotracheal tube tip is at the level of the clavicular heads. Mild cardiomegaly with bihilar prominence. No focal airspace consolidation or pulmonary edema. No pleural effusion. IMPRESSION: Endotracheal tube tip at the level of the clavicular heads. Electronically Signed   By: Lennette Bihari  [place] M.D.   On: 08/04/2019 19:21   ECHOCARDIOGRAM COMPLETE  Result Date: 08/05/2019    ECHOCARDIOGRAM REPORT   Patient Name:   Jean Watts Date of Exam: 08/05/2019 Medical Rec #:  [id number]       Height:       64.0 in Accession #:    [id number]      Weight:       222.2 lb Date of Birth:  Jan 21, 1941       BSA:          2.05 m Patient Age:    6 years        BP:           133/62 mmHg Patient Gender: F               HR:           90 bpm. Exam Location:  Inpatient Procedure: 2D Echo Indications:    Stroke 434.91/I163.9  History:        Patient has no prior history of Echocardiogram examinations.                 Risk Factors:Hypertension, Diabetes and Dyslipidemia.   Sonographer:    Clayton Lefort RDCS (AE) Referring Phys: [id number] Radford Pax  Sonographer Comments: Patient is morbidly obese. IMPRESSIONS  1. Left ventricular ejection fraction, by estimation, is 60 to 65%. The left ventricle has normal function. The left ventrical has no regional wall motion abnormalities. There is mildly increased concentric left ventricular hypertrophy. Left ventricular  diastolic parameters are consistent with Grade I diastolic dysfunction (impaired relaxation).  2. Right ventricular systolic function is normal. The right ventricular size is normal. There is mildly elevated pulmonary artery systolic pressure.  3. No left atrial/left atrial appendage thrombus was detected.  4. The mitral valve is normal in structure and function. trivial mitral valve regurgitation. No evidence of mitral stenosis.  5. The aortic valve is tricuspid. Aortic valve regurgitation is not visualized. Mild aortic valve sclerosis is present, with no evidence of aortic valve stenosis.  6. The inferior vena cava is normal in size with greater than 50% respiratory variability, suggesting right atrial pressure of 3 mmHg. FINDINGS  Left Ventricle: Left ventricular ejection fraction, by estimation, is 60 to 65%. The left ventricle has normal function. The left ventricle has no regional wall motion abnormalities. The left ventricular internal cavity size was normal in size. There is  mildly increased concentric left ventricular hypertrophy. Left ventricular diastolic parameters are consistent with Grade I diastolic dysfunction (impaired relaxation). Right Ventricle: The right ventricular size is normal. No increase in right ventricular wall thickness. Right ventricular systolic function is normal. There is mildly elevated pulmonary artery systolic pressure. The tricuspid regurgitant velocity is 3.01  m/s, and with an assumed right atrial pressure of 3 mmHg, the estimated right ventricular systolic pressure is 123XX123 mmHg. Left  Atrium: Left atrial size was normal in size. Right Atrium: Right atrial size was normal in size. Pericardium: There is no evidence of pericardial effusion. Mitral Valve: The mitral valve is normal in structure and function. There is mild thickening of the mitral valve leaflet(s). Normal mobility of the mitral valve leaflets. Mild mitral annular calcification. Trivial mitral valve regurgitation. No evidence of mitral valve stenosis. MV peak gradient, 8.8 mmHg. The mean mitral valve gradient is 3.0 mmHg. Tricuspid Valve: The tricuspid valve is normal in structure. Tricuspid valve regurgitation is mild . No evidence of tricuspid stenosis. Aortic Valve: The aortic valve  is tricuspid. . There is severe thickening and severe calcifcation of the aortic valve. Aortic valve regurgitation is not visualized. Mild aortic valve sclerosis is present, with no evidence of aortic valve stenosis. There is severe thickening of the aortic valve. There is severe calcifcation of the aortic valve. Aortic valve mean gradient measures 10.0 mmHg. Aortic valve peak gradient measures 19.7 mmHg. Aortic valve area, by VTI measures 1.64 cm. Pulmonic Valve: The pulmonic valve was normal in structure. Pulmonic valve regurgitation is not visualized. No evidence of pulmonic stenosis. Aorta: The aortic root is normal in size and structure. Venous: The inferior vena cava is normal in size with greater than 50% respiratory variability, suggesting right atrial pressure of 3 mmHg. The inferior vena cava and the hepatic vein show a normal flow pattern. IAS/Shunts: No atrial level shunt detected by color flow Doppler.  LEFT VENTRICLE PLAX 2D LVIDd:         4.30 cm  Diastology LVIDs:         3.01 cm  LV e' lateral:   8.27 cm/s LV PW:         1.28 cm  LV E/e' lateral: 9.9 LV IVS:        1.15 cm  LV e' medial:    7.94 cm/s LVOT diam:     1.90 cm  LV E/e' medial:  10.3 LV SV:         69.75 ml LV SV Index:   21.89 LVOT Area:     2.84 cm  RIGHT VENTRICLE              IVC RV Basal diam:  2.51 cm     IVC diam: 1.74 cm RV S prime:     18.80 cm/s TAPSE (M-mode): 2.4 cm LEFT ATRIUM             Index       RIGHT ATRIUM           Index LA diam:        3.20 cm 1.56 cm/m  RA Area:     11.10 cm LA Vol (A2C):   45.8 ml 22.39 ml/m RA Volume:   20.70 ml  10.12 ml/m LA Vol (A4C):   57.6 ml 28.15 ml/m LA Biplane Vol: 52.3 ml 25.56 ml/m  AORTIC VALVE AV Area (Vmax):    1.65 cm AV Area (Vmean):   1.57 cm AV Area (VTI):     1.64 cm AV Vmax:           222.00 cm/s AV Vmean:          151.000 cm/s AV VTI:            0.425 m AV Peak Grad:      19.7 mmHg AV Mean Grad:      10.0 mmHg LVOT Vmax:         129.00 cm/s LVOT Vmean:        83.500 cm/s LVOT VTI:          0.246 m LVOT/AV VTI ratio: 0.58  AORTA Ao Root diam: 2.90 cm Ao Asc diam:  3.20 cm MITRAL VALVE                         TRICUSPID VALVE MV Area (PHT): 3.83 cm              TR Peak grad:   36.2 mmHg MV Peak grad:  8.8 mmHg  TR Vmax:        301.00 cm/s MV Mean grad:  3.0 mmHg MV Vmax:       1.48 m/s              SHUNTS MV Vmean:      78.1 cm/s             Systemic VTI:  0.25 m MV Decel Time: 198 msec              Systemic Diam: 1.90 cm MV E velocity: 81.90 cm/s  103 cm/s MV A velocity: 129.00 cm/s 70.3 cm/s MV E/A ratio:  0.63        1.5 Fransico Him MD Electronically signed by Fransico Him MD Signature Date/Time: 08/05/2019/10:32:04 AM    Final    CT HEAD CODE STROKE WO CONTRAST  Result Date: 08/04/2019 CLINICAL DATA:  Code stroke. Wake up stroke. Patient awoke with expressive aphasia. Unable to identify objects. Right-sided arm weakness and lip droop. EXAM: CT HEAD WITHOUT CONTRAST TECHNIQUE: Contiguous axial images were obtained from the base of the skull through the vertex without intravenous contrast. COMPARISON:  None. FINDINGS: Brain: There is diffuse loss of gray-white differentiation the posterior aspect of the left insular ribbon. Focal nonhemorrhagic infarct is also present in the posterior left frontal  lobe, best seen on image 20 of series 2. Moderate diffuse periventricular and subcortical white matter hypoattenuation is present bilaterally. No other focal cortical abnormalities are present. The basal ganglia are intact. There is of ischemia are noted within the thalami bilaterally, likely remote. The brainstem is normal. Moderate cerebellar atrophy is noted. The ventricles are proportionate to the degree of atrophy. No significant extraaxial fluid collection is present. Vascular: Dense atherosclerotic calcifications are present within the cavernous internal carotid arteries bilaterally. Asymmetric hyperdense left M1 segment is noted. This extends to the bifurcation. Vascular calcifications are noted at the dural margin of the vertebral arteries bilaterally. Skull: Calvarium is intact. No focal lytic or blastic lesions are present. Sinuses/Orbits: The paranasal sinuses and mastoid air cells are clear. Calcified right globe is noted. The left globe and orbit are normal. ASPECTS [facility] Stroke Program Early CT Score) - Ganglionic level infarction (caudate, lentiform nuclei, internal capsule, insula, M1-M3 cortex): 5/7 - Supraganglionic infarction (M4-M6 cortex): 1/3 Total score (0-10 with 10 being normal): 7/10 IMPRESSION: 1. Acute nonhemorrhagic infarcts involving the posterior left insular ribbon and posterior super ganglionic left frontal lobe. 2. Asymmetric hyperdense left M1 segment suggesting thrombus. 3. Advanced atrophy and diffuse white matter disease likely reflects the sequela of chronic microvascular ischemia. 4. Ischemic changes of the thalami bilaterally are likely remote. 5. ASPECTS is 7/10 If patient was last seen well within the last 24 hours, CT perfusion may be useful for further evaluation of brain ischemia versus infarct. These results were called by telephone at the time of interpretation on 08/04/2019 at 2:08 pm to provider Veryl Speak , who verbally acknowledged these results. Electronically  Signed   By: San Morelle M.D.   On: 08/04/2019 14:12   VAS [place] LOWER EXTREMITY VENOUS (DVT)  Result Date: 08/06/2019  Lower Venous DVTStudy Indications: Stroke.  Comparison Study: no prior Performing Technologist: Abram Sander RVS  Examination Guidelines: A complete evaluation includes B-mode imaging, spectral Doppler, color Doppler, and power Doppler as needed of all accessible portions of each vessel. Bilateral testing is considered an integral part of a complete examination. Limited examinations for reoccurring indications may be performed as noted. The reflux portion of the  exam is performed with the patient in reverse Trendelenburg.  +---------+---------------+---------+-----------+----------+--------------+ RIGHT    CompressibilityPhasicitySpontaneityPropertiesThrombus Aging +---------+---------------+---------+-----------+----------+--------------+ CFV      Full           Yes      Yes                                 +---------+---------------+---------+-----------+----------+--------------+ SFJ      Full                                                        +---------+---------------+---------+-----------+----------+--------------+ FV Prox  Full                                                        +---------+---------------+---------+-----------+----------+--------------+ FV Mid   Full                                                        +---------+---------------+---------+-----------+----------+--------------+ FV DistalFull                                                        +---------+---------------+---------+-----------+----------+--------------+ PFV      Full                                                        +---------+---------------+---------+-----------+----------+--------------+ POP      Full           Yes      Yes                                  +---------+---------------+---------+-----------+----------+--------------+ PTV      Full                                                        +---------+---------------+---------+-----------+----------+--------------+ PERO     Full                                                        +---------+---------------+---------+-----------+----------+--------------+   +---------+---------------+---------+-----------+----------+--------------+ LEFT     CompressibilityPhasicitySpontaneityPropertiesThrombus Aging +---------+---------------+---------+-----------+----------+--------------+ CFV      Full           Yes      Yes                                 +---------+---------------+---------+-----------+----------+--------------+  SFJ      Full                                                        +---------+---------------+---------+-----------+----------+--------------+ FV Prox  Full                                                        +---------+---------------+---------+-----------+----------+--------------+ FV Mid   Full                                                        +---------+---------------+---------+-----------+----------+--------------+ FV DistalFull                                                        +---------+---------------+---------+-----------+----------+--------------+ PFV      Full                                                        +---------+---------------+---------+-----------+----------+--------------+ POP      Full           Yes      Yes                                 +---------+---------------+---------+-----------+----------+--------------+ PTV      Full                                                        +---------+---------------+---------+-----------+----------+--------------+ PERO     Full                                                         +---------+---------------+---------+-----------+----------+--------------+     Summary: BILATERAL: - No evidence of deep vein thrombosis seen in the lower extremities, bilaterally.   *See table(s) above for measurements and observations. Electronically signed by Harold Barban MD on 08/06/2019 at 7:27:03 AM.    Final     12-lead ECG sinus rhythm (personally reviewed) All prior EKG's in EPIC reviewed with no documented atrial fibrillation  Telemetry SR (personally reviewed)  Assessment and Plan:  1. Cryptogenic stroke The patient presents with cryptogenic stroke. I spoke at length with the patient about monitoring for afib with an implantable loop recorder.  Risks, benefits, and alteratives to implantable loop recorder were discussed with the  patient today.   At this time, the patient is very clear in their decision to proceed with implantable loop recorder.   Wound care was reviewed with the patient (keep incision clean and dry for 3 days).  Wound check scheduled and entered in AVS.  Please call with questions.   Chanetta Marshall, NP 08/06/2019 9:09 AM   I have seen, examined the patient, and reviewed the above assessment and plan.  Changes to above are made where necessary.  On exam, RRR.   The patient presents with cryptogenic stroke. I spoke at length with the patient about monitoring for afib an implantable loop recorder.  Risks, benefits, and alteratives to implantable loop recorder were discussed with the patient today.   At this time, the patient is very clear in their decision to proceed with implantable loop recorder.   Please call with questions.   Co Sign: Thompson Grayer, MD 08/06/2019 10:55 AM

## 2019-08-06 NOTE — Progress Notes (Signed)
  Speech Language Pathology Treatment: Cognitive-Linquistic  Patient Details Name: Jean Watts MRN: [id number] DOB: February 04, 1941 Today's Date: 08/06/2019 Time: 1201-1220 SLP Time Calculation (min) (ACUTE ONLY): 19 min  Assessment / Plan / Recommendation Clinical Impression   Pt was seen for aphasia treatment and was cooperative during the session. She followed 3-step commands with 66% accuracy when moderate cues and repetition were given. She achieved 75% accuracy with responsive naming increasing to 100% with min. cues. She provided 6-7 items per category during divergent naming tasks when min-mod cues were given. She required mod support to accurately respond to complex yes/no questions and min-mod support during conversation. SLP will continue to follow pt.    HPI HPI: Pt is a 79 y.o. 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 MRI of the brain revealed small volume acute infarct of the left insular ribbon and medial left temporal lobe. Punctate focus of acute ischemia within the subcortical left frontal lobe.      SLP Plan  Continue with current plan of care  Patient needs continued Speech Lanaguage Pathology Services    Recommendations                   Follow up Recommendations: Home health SLP SLP Visit Diagnosis: Aphasia (R47.01) Plan: Continue with current plan of care       Chai Routh I. Hardin Negus, [place], [place] Office number [phone] Pager [facility] 08/06/2019, 1:28 PM

## 2019-08-06 NOTE — TOC Progression Note (Signed)
Transition of Care [facility]) - Progression Note    Patient Details  Name: Jean Watts MRN: [id number] Date of Birth: [birth year]  Transition of Care [facility] CM/SW [place], [place] Work Phone Number: 08/06/2019, 10:32 AM  Clinical Narrative:    MSW intern went to speak with Pt about discharge plans with [facility]. Pt states that she has no preference for which agency is used. Pt noted that she has equipment at home, but feels that she needs something to help her in the bathroom, Pt may need a 3n1 when discharging. Pt says her daughter is her point of contact. SW will continue to follow.        Expected Discharge Plan and Services                                                 Social Determinants of Health (SDOH) Interventions    Readmission Risk Interventions No flowsheet data found.

## 2019-08-06 NOTE — Progress Notes (Signed)
Patient is being discharged home with home health.  Patient is being transported by her daughter.  IV removed with the catheter intact. Discharge instructions and prescription information given to the patient and her daughter who verbalized understanding.

## 2019-08-06 NOTE — Interval H&P Note (Signed)
History and Physical Interval Note:  08/06/2019 10:56 AM  Jean Watts  has presented today for surgery, with the diagnosis of stroke.  The various methods of treatment have been discussed with the patient and family. After consideration of risks, benefits and other options for treatment, the patient has consented to  Procedure(s): LOOP RECORDER INSERTION (N/A) as a surgical intervention.  The patient's history has been reviewed, patient examined, no change in status, stable for surgery.  I have reviewed the patient's chart and labs.  Questions were answered to the patient's satisfaction.     Thompson Grayer

## 2019-08-20 ENCOUNTER — Ambulatory Visit: Payer: 59

## 2019-08-21 ENCOUNTER — Telehealth: Payer: Self-pay | Admitting: Internal Medicine

## 2019-08-21 NOTE — Telephone Encounter (Signed)
Spoke with pt caregiver.   Appt rescheduled for 3/2 10:30am

## 2019-08-21 NOTE — Telephone Encounter (Signed)
New Message    Pts daughter is calling and says the pt missed her wound check appt yesterday and she would like to reschedule    Please call

## 2019-08-25 ENCOUNTER — Ambulatory Visit: Payer: 59

## 2019-09-03 ENCOUNTER — Other Ambulatory Visit: Payer: Self-pay

## 2019-09-03 ENCOUNTER — Ambulatory Visit (INDEPENDENT_AMBULATORY_CARE_PROVIDER_SITE_OTHER): Payer: 59 | Admitting: Student

## 2019-09-03 DIAGNOSIS — I6602 Occlusion and stenosis of left middle cerebral artery: Secondary | ICD-10-CM

## 2019-09-03 LAB — CUP PACEART INCLINIC DEVICE CHECK
Date Time Interrogation Session: 20210311093321
Implantable Pulse Generator Implant Date: 20210211

## 2019-09-03 NOTE — Progress Notes (Signed)
ILR wound check in clinic. Steri strips removed. Wound well healed. Home monitor transmitting nightly. No episodes. Questions answered.  

## 2019-09-11 ENCOUNTER — Ambulatory Visit (INDEPENDENT_AMBULATORY_CARE_PROVIDER_SITE_OTHER): Payer: 59 | Admitting: *Deleted

## 2019-09-11 DIAGNOSIS — I6602 Occlusion and stenosis of left middle cerebral artery: Secondary | ICD-10-CM | POA: Diagnosis not present

## 2019-09-12 LAB — CUP PACEART REMOTE DEVICE CHECK
Date Time Interrogation Session: 20210319161855
Implantable Pulse Generator Implant Date: 20210211

## 2019-09-15 ENCOUNTER — Encounter: Payer: Self-pay | Admitting: Adult Health

## 2019-09-15 ENCOUNTER — Ambulatory Visit (INDEPENDENT_AMBULATORY_CARE_PROVIDER_SITE_OTHER): Payer: 59 | Admitting: Adult Health

## 2019-09-15 ENCOUNTER — Other Ambulatory Visit: Payer: Self-pay

## 2019-09-15 VITALS — BP 144/75 | HR 74 | Temp 97.4°F | Ht 64.0 in | Wt 222.4 lb

## 2019-09-15 DIAGNOSIS — I10 Essential (primary) hypertension: Secondary | ICD-10-CM

## 2019-09-15 DIAGNOSIS — E785 Hyperlipidemia, unspecified: Secondary | ICD-10-CM | POA: Diagnosis not present

## 2019-09-15 DIAGNOSIS — I63512 Cerebral infarction due to unspecified occlusion or stenosis of left middle cerebral artery: Secondary | ICD-10-CM | POA: Diagnosis not present

## 2019-09-15 DIAGNOSIS — E1165 Type 2 diabetes mellitus with hyperglycemia: Secondary | ICD-10-CM | POA: Diagnosis not present

## 2019-09-15 DIAGNOSIS — I6602 Occlusion and stenosis of left middle cerebral artery: Secondary | ICD-10-CM

## 2019-09-15 NOTE — Progress Notes (Signed)
[facility] [address] [place]. [place] [address] [phone]       HOSPITAL FOLLOW UP NOTE  [name] Date of Birth:  06-Aug-1940 Medical Record Number:  [id number]   Reason for Referral:  hospital stroke follow up    CHIEF COMPLAINT:  Chief Complaint  Patient presents with  . Follow-up    hospital stroke follow up room 9 with daughter patricia pt has cane and is getting home therapy with a nurse     HPI: Jean Watts being seen today for in office hospital follow-up regarding cryptogenic left MCA infarct on 08/04/2019.  History obtained from patient, daughter and chart review. Reviewed all radiology images and labs personally.  JeanDrucilla Chaviousis a 79 y.o.femalewith history of hypertension, diabetes mellitus, hyperlipidemia, prior"stroke"in the right eye, neuropathywho presented on 08/04/2019 to [facility] and R sided weakness.Transferred to [facility] after hyperdense L M1 found for IR. Evaluated by stroke team and Dr. Erlinda Hong with stroke work-up revealing small left MCA infarcts s/p IR w/ TICI 3 revascularization L M1 occlusion, infarct embolic secondary to unknown source suspicion for occult AF.  Loop recorder was placed to rule out AF as possible stroke etiology.  Dense bilateral ICA cavernous arthrosclerosis without focal stenosis and minimal arthrosclerosis of bifurcations with tortuous ICAs but left MCA patent post IR.  Recommended DAPT for 3 weeks and aspirin alone.  History of HTN requiring Cleviprex during admission holding BP meds at discharge and advised to follow-up with PCP for long-term BP goal normotensive range.  LDL 129 initiate atorvastatin 40 mg daily.  History of DM uncontrolled with A1c 7.2 with diabetic polyneuropathy resuming home medications and advise close PCP follow-up.  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  Other active problems include mild anemia due to chronic disease, resolved acute respiratory failure and resolved leukocytosis.  Discharged home in stable condition on 08/06/2019 with recommendation of home health therapies.  Jean Watts is a 79 year old female who is being seen today for stroke follow-up accompanied by her daughter.  She has been stable from a stroke standpoint with residual mild cognitive impairment and gait impairment but overall improving.  Evidence of mild aphasia and dysarthria but per daughter, patient's baseline.  Currently ambulating with a cane and ongoing participation in home health physical therapy.  Continues to reside with daughter who helps assist minimally with ADLs.  Continues on aspirin and atorvastatin for secondary stroke prevention without side effects.  Blood pressure today 144/75.  Glucose levels stable.  Loop recorder has not shown atrial fibrillation thus far.  No concerns at this time.      ROS:   14 system review of systems performed and negative with exception of memory loss, visual loss, gait impairment  PMH:  Past Medical History:  Diagnosis Date  . Dyspnea   . HLD (hyperlipidemia)   . HTN (hypertension)   . Neuropathy   . Stroke ([place])   . Type 2 diabetes mellitus with diabetic polyneuropathy ([place])   . Visual loss    right eye    PSH:  Past Surgical History:  Procedure Laterality Date  . ABDOMINAL HYSTERECTOMY    . BREAST BIOPSY  02/2019  . FLEXIBLE SIGMOIDOSCOPY N/A 06/04/2019   Procedure: FLEXIBLE SIGMOIDOSCOPY;  Surgeon: Daneil Dolin, MD;  [facility];  Service: Endoscopy;  Laterality: N/A;  incomplete colonoscopy, poor prep  . IR CT HEAD LTD  08/04/2019  . IR PERCUTANEOUS ART THROMBECTOMY/INFUSION INTRACRANIAL INC DIAG ANGIO  08/04/2019  . LOOP RECORDER INSERTION N/A 08/06/2019   Procedure: LOOP RECORDER INSERTION;  Surgeon: Thompson Grayer, MD;  Location: [facility];  Service: Cardiovascular;  Laterality:  N/A;  . RADIOLOGY WITH ANESTHESIA N/A 08/04/2019   Procedure: IR WITH ANESTHESIA;  Surgeon: Radiologist, Medication, MD;  Location: [facility];  Service: Radiology;  Laterality: N/A;    Social History:  Social History   Socioeconomic History  . Marital status: Unknown    Spouse name: Not on file  . Number of children: Not on file  . Years of education: Not on file  . Highest education level: Not on file  Occupational History  . Not on file  Tobacco Use  . Smoking status: Never Smoker  . Smokeless tobacco: Never Used  Substance and Sexual Activity  . Alcohol use: Never  . Drug use: Never  . Sexual activity: Not Currently  Other Topics Concern  . Not on file  Social History Narrative  . Not on file   Social Determinants of Health   Financial Resource Strain:   . Difficulty of Paying Living Expenses:   Food Insecurity:   . Worried About Charity fundraiser in the Last Year:   . Arboriculturist in the Last Year:   Transportation Needs:   . Film/video editor (Medical):   Marland Kitchen Lack of Transportation (Non-Medical):   Physical Activity:   . Days of Exercise per Week:   . Minutes of Exercise per Session:   Stress:   . Feeling of Stress :   Social Connections:   . Frequency of Communication with Friends and Family:   . Frequency of Social Gatherings with Friends and Family:   . Attends Religious Services:   . Active Member of Clubs or Organizations:   . Attends Archivist Meetings:   Marland Kitchen Marital Status:   Intimate Partner Violence:   . Fear of Current or Ex-Partner:   . Emotionally Abused:   Marland Kitchen Physically Abused:   . Sexually Abused:     Family History:  Family History  Problem Relation Age of Onset  . Colon cancer Neg Hx   . Colon polyps Neg Hx     Medications:   Current Outpatient Medications on File Prior to Visit  Medication Sig Dispense Refill  . albuterol (PROVENTIL) (2.5 MG/3ML) 0.083% nebulizer solution Inhale 2.5 mg into the lungs every 6 (six)  hours as needed for wheezing or shortness of breath.     Marland Kitchen amitriptyline (ELAVIL) 50 MG tablet Take 50 mg by mouth at bedtime.     Marland Kitchen aspirin 325 MG tablet Take 1 tablet (325 mg total) by mouth daily.    Marland Kitchen atorvastatin (LIPITOR) 40 MG tablet Take 1 tablet (40 mg total) by mouth daily at 6 PM. 30 tablet 2  . gabapentin (NEURONTIN) 400 MG capsule Take 400 mg by mouth 3 (three) times daily.     Marland Kitchen HUMULIN N 100 UNIT/ML injection Inject 20-40 Units into the skin See admin instructions. Inject 40 units into the skin in the morning and 20 units in the evening    . meclizine (ANTIVERT) 25 MG tablet Take 25 mg by mouth at bedtime.     . metFORMIN (GLUCOPHAGE) 1000 MG tablet Take 1,000 mg by mouth 2 (two) times daily.     . metoprolol tartrate (LOPRESSOR) 25 MG tablet Take 25 mg by mouth 2 (two) times daily.  No current facility-administered medications on file prior to visit.    Allergies:  No Known Allergies   Physical Exam  Vitals:   09/15/19 0916  BP: (!) 144/75  Pulse: 74  Temp: (!) 97.4 F (36.3 C)  Weight: 222 lb 6.4 oz (100.9 kg)  Height: 5\' 4"  (1.626 m)   Body mass index is 38.17 kg/m. No exam data present  No flowsheet data found.   General: well developed, well nourished,  pleasant elderly African-American female, seated, in no evident distress Head: head normocephalic and atraumatic.   Neck: supple with no carotid or supraclavicular bruits Cardiovascular: regular rate and rhythm, no murmurs Musculoskeletal: no deformity Skin:  no rash/petichiae Vascular:  Normal pulses all extremities   Neurologic Exam Mental Status: Awake and fully alert.   Mild expressive aphasia and dysarthria (per daughter, patient's baseline).  Oriented to place and time. Recent memory impaired and remote memory intact. Attention span, concentration and fund of knowledge slightly impaired with daughter providing input or correction when needed. Mood and affect appropriate.  Cranial Nerves: Left  eye pupil briskly reactive to light.  Right visual loss with corneal whitening.  Extraocular movements full without nystagmus. Visual fields full to confrontation. Hearing intact. Facial sensation intact.  Mild right lower facial weakness. Motor: Normal bulk and tone. Normal strength in all tested extremity muscles. Sensory.: intact to touch , pinprick , position and vibratory sensation.  Coordination: Rapid alternating movements normal in all extremities. Finger-to-nose and heel-to-shin performed accurately bilaterally. Gait and Station: Arises from chair without difficulty. Stance is normal. Gait demonstrates normal stride length with mild imbalance and use of cane Reflexes: 1+ and symmetric. Toes downgoing.     NIHSS  3 Modified Rankin  3    Diagnostic Data (Labs, Imaging, Testing)   Code Stroke CT head posterior L insular ribbon and posterior uper ganglionic L frontal lobe infarcts. Hyperdense L M1.Old ischemic changes B thalami. ASPECTS7  CTA head & neckL MCA bifurcation occlusionwith good collaterals.Dense B ICA cavernous atherosclerosis w/o focal stenosis; minimal atherosclerosis at bifurcaitons. Tortuous ICAs.   CT perfusionpositive penumbra. No significant core.   Cerebral angio L M1 w/ proximal superior and inferior division occlusion w/ TICI3 revascularization  MRISmall infarct L insular ribbon and medial L temporal lobe. Punctate subcortical L frontal lobe infarct. Small vessel disease.  MRALeft MCA patent post IR  LE DopplerNeg DVT  2D EchoEF60-65%. No source of embolus  Loop recorderinserted 08/06/2019 ([facility]) to look for AF as possible source of stroke  LDL129  HgbA1c7.3    ASSESSMENT: Clariza Stanback is a 79 y.o. 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  Recovered well from stroke standpoint with residual imbalance and right lower facial weakness but daughter reports ongoing improvement.  Evidence of speech and language impairment but per daughter, patient's baseline    PLAN:  1. Right MCA stroke: Continue aspirin 81 mg daily  and atorvastatin for secondary stroke prevention. Maintain strict control of hypertension with blood pressure goal below 130/90, diabetes with hemoglobin A1c goal below 6.5% and cholesterol with LDL cholesterol (bad cholesterol) goal below 70 mg/dL.  I also  advised the patient to eat a healthy diet with plenty of whole grains, cereals, fruits and vegetables, exercise regularly with at least 30 minutes of continuous activity daily and maintain ideal body weight.  Continue to monitor loop recorder for possible atrial fibrillation.  Continue ongoing participation in home health therapies and advised to notify office if additional outpatient therapy is needed 2. Left M1 occlusion s/p IR: Continuation of aspirin and statin.  We will repeat imaging at follow-up visit for surveillance monitoring 3. HTN: Advised to continue current treatment regimen.  Today's BP stable.  Advised to continue to monitor at home along with continued follow-up with PCP for management 4. HLD: Advised to continue current treatment regimen along with continued follow-up with PCP for future prescribing and monitoring of lipid panel 5. DMII: Advised to continue to monitor glucose levels at home along with continued follow-up with PCP for management and monitoring 6. Patient interested in additional information regarding [facility] trial.  Notified [facility] research team for further discussion with patient and daughter.    Follow  up in 3 months or call earlier if needed   Greater than 50% of time during this 45 minute visit was spent on counseling, explanation of diagnosis of cryptogenic stroke, reviewing risk factor management of HTN, HLD and DM, planning of further management along with potential future management, and discussion with patient and family answering all questions.    Frann Rider, AGNP-BC  [facility] [address] [place], [place] [address]  Phone [phone] Fax [phone] Note: This document was prepared with digital dictation and possible smart phrase technology. Any transcriptional errors that result from this process are unintentional.

## 2019-09-15 NOTE — Patient Instructions (Signed)
Continue aspirin 81 mg daily  and atorvastatin for secondary stroke prevention  Continue to follow up with PCP regarding cholesterol, blood pressure and diabetes management   Loop recorder will continue to be monitored for atrial fibrillation  Continue to participate in home health therapies and notify office if you wish to pursue outpatient therapy  Continue to monitor blood pressure at home  Maintain strict control of hypertension with blood pressure goal below 130/90, diabetes with hemoglobin A1c goal below 6.5% and cholesterol with LDL cholesterol (bad cholesterol) goal below 70 mg/dL. I also advised the patient to eat a healthy diet with plenty of whole grains, cereals, fruits and vegetables, exercise regularly and maintain ideal body weight.  Followup in the future with me in 3 months or call earlier if needed       Thank you for coming to see [place] at [facility]. I hope we have been able to provide you high quality care today.  You may receive a patient satisfaction survey over the next few weeks. We would appreciate your feedback and comments so that we may continue to improve ourselves and the health of our patients.

## 2019-09-25 NOTE — Progress Notes (Signed)
I agree with the above plan 

## 2019-10-12 ENCOUNTER — Ambulatory Visit (INDEPENDENT_AMBULATORY_CARE_PROVIDER_SITE_OTHER): Payer: 59 | Admitting: *Deleted

## 2019-10-12 DIAGNOSIS — I6602 Occlusion and stenosis of left middle cerebral artery: Secondary | ICD-10-CM | POA: Diagnosis not present

## 2019-10-12 LAB — CUP PACEART REMOTE DEVICE CHECK
Date Time Interrogation Session: 20210419161730
Implantable Pulse Generator Implant Date: 20210211

## 2019-10-13 NOTE — Progress Notes (Signed)
ILR Remote 

## 2019-10-24 ENCOUNTER — Encounter (HOSPITAL_COMMUNITY): Payer: Self-pay | Admitting: *Deleted

## 2019-10-24 ENCOUNTER — Other Ambulatory Visit: Payer: Self-pay

## 2019-10-24 ENCOUNTER — Emergency Department (HOSPITAL_COMMUNITY): Payer: 59

## 2019-10-24 ENCOUNTER — Emergency Department (HOSPITAL_COMMUNITY)
Admission: EM | Admit: 2019-10-24 | Discharge: 2019-10-24 | Disposition: A | Discharge status: Home / Self Care | Payer: 59 | Attending: Emergency Medicine | Admitting: Emergency Medicine

## 2019-10-24 DIAGNOSIS — G44209 Tension-type headache, unspecified, not intractable: Secondary | ICD-10-CM | POA: Diagnosis not present

## 2019-10-24 DIAGNOSIS — R42 Dizziness and giddiness: Secondary | ICD-10-CM | POA: Diagnosis present

## 2019-10-24 DIAGNOSIS — I639 Cerebral infarction, unspecified: Secondary | ICD-10-CM | POA: Insufficient documentation

## 2019-10-24 DIAGNOSIS — E1140 Type 2 diabetes mellitus with diabetic neuropathy, unspecified: Secondary | ICD-10-CM | POA: Diagnosis not present

## 2019-10-24 DIAGNOSIS — I10 Essential (primary) hypertension: Secondary | ICD-10-CM | POA: Diagnosis not present

## 2019-10-24 LAB — CBC
HCT: 36 % (ref 36.0–46.0)
Hemoglobin: 11.3 g/dL — ABNORMAL LOW (ref 12.0–15.0)
MCH: 26.8 pg (ref 26.0–34.0)
MCHC: 31.4 g/dL (ref 30.0–36.0)
MCV: 85.5 fL (ref 80.0–100.0)
Platelets: 278 10*3/uL (ref 150–400)
RBC: 4.21 MIL/uL (ref 3.87–5.11)
RDW: 15.1 % (ref 11.5–15.5)
WBC: 4.6 10*3/uL (ref 4.0–10.5)
nRBC: 0 % (ref 0.0–0.2)

## 2019-10-24 LAB — BASIC METABOLIC PANEL
Anion gap: 8 (ref 5–15)
BUN: 10 mg/dL (ref 8–23)
CO2: 24 mmol/L (ref 22–32)
Calcium: 9.2 mg/dL (ref 8.9–10.3)
Chloride: 105 mmol/L (ref 98–111)
Creatinine, Ser: 0.8 mg/dL (ref 0.44–1.00)
GFR calc Af Amer: >60 mL/min (ref >60)
GFR calc non Af Amer: >60 mL/min (ref >60)
Glucose, Bld: 82 mg/dL (ref 70–99)
Potassium: 3.9 mmol/L (ref 3.5–5.1)
Sodium: 137 mmol/L (ref 135–145)

## 2019-10-24 MED ORDER — KETOROLAC TROMETHAMINE 30 MG/ML IJ SOLN
15.0000 mg | Freq: Once | INTRAMUSCULAR | Status: AC
  Administered 2019-10-24: 15 mg via INTRAMUSCULAR
  Filled 2019-10-24: qty 1

## 2019-10-24 NOTE — ED Provider Notes (Signed)
Emergency Department Provider Note   I have reviewed the triage vital signs and the nursing notes.   HISTORY  Chief Complaint Dizziness   HPI Jean Watts is a 79 y.o. female with past medical history reviewed below including prior and recent stroke presents to the emergency department with intermittent headache over the past 3 weeks.  She describes some associated dizziness which she describes as feeling off balance when walking.  She states that this symptom is actually been present even before her stroke in February and especially can tell is not particularly worse in the past several weeks.  Her headache does not seem to make the balance difficulties worse.  She was seen by her neurologist as an outpatient since her discharge and they noted some gait instability at that time as well.  Patient denies any weakness, numbness, or vision change.  She has baseline blindness in the right eye.  She describes her headache as gradual onset and on the top of her head and sometimes the right side of her head.  She states that she frequently takes ibuprofen and that it will resolve for short period of time but then return. The dizziness is short and resolves after a few seconds. Improved with sitting down.  No radiation of symptoms or other modifying factors.   Past Medical History:  Diagnosis Date  . Dyspnea   . HLD (hyperlipidemia)   . HTN (hypertension)   . Neuropathy   . Stroke ([place])   . Type 2 diabetes mellitus with diabetic polyneuropathy ([place])   . Visual loss    right eye    Patient Active Problem List   Diagnosis Date Noted  . Essential hypertension 08/06/2019  . Hyperlipidemia LDL goal <70 08/06/2019  . Diabetes mellitus type II, uncontrolled ([place]) 08/06/2019  . Diabetic polyneuropathy ([place]) 08/06/2019  . Obesity 08/06/2019  . Migraine 08/06/2019  . CRAO (central retinal artery occlusion), right - 2012 08/06/2019  . 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 Status post stroke  08/04/2019  . Acute ischemic left MCA stroke (HCC) s/p IR, embolic d/t unknown source 08/04/2019  . Middle cerebral artery embolism, left 08/04/2019  . Colon cancer screening 03/16/2019    Past Surgical History:  Procedure Laterality Date  . ABDOMINAL HYSTERECTOMY    . BREAST BIOPSY  02/2019  . FLEXIBLE SIGMOIDOSCOPY N/A 06/04/2019   Procedure: FLEXIBLE SIGMOIDOSCOPY;  Surgeon: Daneil Dolin, MD;  [facility];  Service: Endoscopy;  Laterality: N/A;  incomplete colonoscopy, poor prep  . IR CT HEAD LTD  08/04/2019  . IR PERCUTANEOUS ART THROMBECTOMY/INFUSION INTRACRANIAL INC DIAG ANGIO  08/04/2019  . LOOP RECORDER INSERTION N/A 08/06/2019   Procedure: LOOP RECORDER INSERTION;  Surgeon: Thompson Grayer, MD;  Location: [place];  Service: Cardiovascular;  Laterality: N/A;  . RADIOLOGY WITH ANESTHESIA N/A 08/04/2019   Procedure: IR WITH ANESTHESIA;  Surgeon: Radiologist, Medication, MD;  Location: [facility];  Service: Radiology;  Laterality: N/A;    Allergies Patient has no known allergies.  Family History  Problem Relation Age of Onset  . Cancer Mother   . Heart attack Father   . Prostate cancer Brother   . Lung cancer Brother   . Colon cancer Neg Hx   . Colon polyps Neg Hx     Social History Social History   Tobacco Use  . Smoking status: Never Smoker  . Smokeless tobacco: Never Used  Substance Use Topics  . Alcohol use: Never  . Drug use: Never  Review of Systems  Constitutional: No fever/chills Eyes: No visual changes. ENT: No sore throat. Cardiovascular: Denies chest pain. Respiratory: Denies shortness of breath. Gastrointestinal: No abdominal pain.  No nausea, no vomiting.  No diarrhea.  No constipation. Genitourinary: Negative for dysuria. Musculoskeletal: Negative for back pain. Skin: Negative for rash. Neurological: Negative for focal weakness or numbness. Positive HA.   10-point ROS otherwise  negative.  ____________________________________________   PHYSICAL EXAM:  VITAL SIGNS: ED Triage Vitals [10/24/19 1719]  Enc Vitals Group     BP (!) 151/59     Pulse Rate 69     Resp 20     Temp 98.5 F (36.9 C)     Temp src      SpO2 97 %   Constitutional: Alert and oriented. Well appearing and in no acute distress. Eyes: Baseline opaque quality of the right cornea.  Pupil on the left is reactive to light. Normal conjunctiva.  Head: Atraumatic. Nose: No congestion/rhinnorhea. Mouth/Throat: Mucous membranes are moist.  Neck: No stridor.  Cardiovascular: Normal rate, regular rhythm. Good peripheral circulation. Grossly normal heart sounds.   Respiratory: Normal respiratory effort.  No retractions. Lungs CTAB. Gastrointestinal: Soft and nontender. No distention.  Musculoskeletal: No lower extremity tenderness nor edema. No gross deformities of extremities. Neurologic:  Normal speech and language. No gross focal neurologic deficits are appreciated. Normal CN exam 2-12. No appreciable weakness on the right from prior CVA.  Skin:  Skin is warm, dry and intact. No rash noted.   ____________________________________________   LABS (all labs ordered are listed, but only abnormal results are displayed)  Labs Reviewed  CBC - Abnormal; Notable for the following components:      Result Value   Hemoglobin 11.3 (*)    All other components within normal limits  BASIC METABOLIC PANEL   ____________________________________________  RADIOLOGY  CT Head Wo Contrast  Result Date: 10/24/2019 CLINICAL DATA:  Dizziness for 3 weeks, headache, diabetes mellitus, hypertension, history stroke EXAM: CT HEAD WITHOUT CONTRAST TECHNIQUE: Contiguous axial images were obtained from the base of the skull through the vertex without intravenous contrast. Sagittal and coronal MPR images reconstructed from axial data set. COMPARISON:  08/04/2019 FINDINGS: Brain: Generalized atrophy. Normal ventricular  morphology. No midline shift or mass effect. Small vessel chronic ischemic changes of deep cerebral white matter. No intracranial hemorrhage, mass lesion, evidence of acute infarction, or extra-axial fluid collection. Vascular: No hyperdense vessels. Atherosclerotic calcification of internal carotid and vertebral arteries at skull base Skull: Intact Sinuses/Orbits: Clear paranasal sinuses and mastoid air cells. Chronic scarring and atrophy of RIGHT optic globe. Other: N/A IMPRESSION: Atrophy with small vessel chronic ischemic changes of deep cerebral white matter. No acute intracranial abnormalities. Electronically Signed   By: Lavonia Dana M.D.   On: 10/24/2019 18:47    ____________________________________________   PROCEDURES  Procedure(s) performed:   Procedures  None ____________________________________________   INITIAL IMPRESSION / ASSESSMENT AND PLAN / ED COURSE  Pertinent labs & imaging results that were available during my care of the patient were reviewed by me and considered in my medical decision making (see chart for details).   Patient presents to the emergency department for evaluation of HA.  She describes some dizziness which seems more like the gait instability described in prior notes.  She is clear that the symptom is not worsening and was present even prior to her stroke in February of this year.  No cerebellar component to her infarcts.  Doubt new stroke or central process to  explain her symptoms.  She is not describing vertigo.  Plan for headache management here and close neurology follow-up as an outpatient.  CT imaging of the head reviewed with no acute findings.   HA improved here. Will continue Neuro f/u as outpatient. Discussed ED return precautions.  ____________________________________________  FINAL CLINICAL IMPRESSION(S) / ED DIAGNOSES  Final diagnoses:  Tension headache     MEDICATIONS GIVEN DURING THIS VISIT:  Medications  ketorolac (TORADOL) 30  MG/ML injection 15 mg (15 mg Intramuscular Given 10/24/19 2027)     Note:  This document was prepared using Dragon voice recognition software and may include unintentional dictation errors.  Nanda Quinton, MD, [facility]    Hillari Zumwalt, [facility], MD 10/27/19 [phone]

## 2019-10-24 NOTE — Discharge Instructions (Signed)
You were seen in the emergency department today with headache.  Please continue to take medications at home per the label on the box.  I would like for you to call both your primary care doctor and your neurologist on Monday to schedule the next available follow-up appointments.  The CT scan of your head today was normal and your lab work is reassuring.  If you develop any sudden severe headache, new weakness, numbness, vision change, or speech difficulty you should call 911 immediately and/or return to the emergency department for re-evaluation.

## 2019-10-24 NOTE — ED Triage Notes (Signed)
States she has been dizzy for 3 weeks, states she has a headache but unable to rate her pain at present

## 2019-10-27 ENCOUNTER — Ambulatory Visit (INDEPENDENT_AMBULATORY_CARE_PROVIDER_SITE_OTHER): Payer: 59 | Admitting: Cardiovascular Disease

## 2019-10-27 ENCOUNTER — Other Ambulatory Visit: Payer: Self-pay | Admitting: Cardiovascular Disease

## 2019-10-27 ENCOUNTER — Encounter: Payer: Self-pay | Admitting: Cardiovascular Disease

## 2019-10-27 ENCOUNTER — Other Ambulatory Visit: Payer: Self-pay

## 2019-10-27 VITALS — BP 134/58 | HR 72 | Temp 96.2°F | Ht 65.0 in | Wt 216.0 lb

## 2019-10-27 DIAGNOSIS — I63512 Cerebral infarction due to unspecified occlusion or stenosis of left middle cerebral artery: Secondary | ICD-10-CM | POA: Diagnosis not present

## 2019-10-27 DIAGNOSIS — E785 Hyperlipidemia, unspecified: Secondary | ICD-10-CM

## 2019-10-27 DIAGNOSIS — I10 Essential (primary) hypertension: Secondary | ICD-10-CM

## 2019-10-27 NOTE — Progress Notes (Signed)
CARDIOLOGY CONSULT NOTE  Patient ID: Jean Watts MRN: [id number] DOB/AGE: February 12, 1941 79 y.o.  Admit date: (Not on file) Primary Physician: Rosita Fire, MD  Reason for Consultation: CVA  HPI: Jean Watts is a 79 y.o. female who is being seen today for the evaluation of CVA at the request of Rosita Fire, MD.  Past medical history includes hypertension, diabetes mellitus, hyperlipidemia, and amaurosis fugax of the right eye.  She was hospitalized in February 2021 for an acute ischemic left MCA stroke.  Reviewed all relevant documentation, labs, and studies.  She was evaluated by Dr. Rayann Heman who proceeded with implantable loop recorder on 08/06/2019 to look for arrhythmia such as atrial fibrillation flutter as potential etiologies for CVA.  Echocardiogram on 08/05/2019 demonstrated normal LV systolic function and regional wall motion, LVEF 60 to [id number], grade 1 diastolic dysfunction, and no significant valvular abnormalities.  Most recent device interrogation on 10/12/2019 showed no evidence of arrhythmias.  I personally reviewed ECG performed on 08/04/2019 which demonstrated sinus rhythm with no ischemic ST segment or T wave abnormalities, nor any arrhythmias.  She was evaluated in the ED on 10/24/2019 for dizziness.  ED physician notes state that it seemed more likely the gait instability which had been previously described.  Symptoms were not worsening.  It was recommended she follow-up closely with neurology.  She is here with her daughter.  She denies chest pain, palpitations, shortness of breath.  She has episodic dizziness.  She walks with a cane.   No Known Allergies  Current Outpatient Medications  Medication Sig Dispense Refill  . albuterol (PROVENTIL) (2.5 MG/3ML) 0.083% nebulizer solution Inhale 2.5 mg into the lungs every 6 (six) hours as needed for wheezing or shortness of breath.     Marland Kitchen amitriptyline (ELAVIL) 50 MG tablet Take 50 mg by mouth at bedtime.     Marland Kitchen  aspirin 325 MG tablet Take 1 tablet (325 mg total) by mouth daily.    Marland Kitchen atorvastatin (LIPITOR) 40 MG tablet Take 1 tablet (40 mg total) by mouth daily at 6 PM. 30 tablet 2  . gabapentin (NEURONTIN) 400 MG capsule Take 400 mg by mouth 3 (three) times daily.     Marland Kitchen HUMULIN N 100 UNIT/ML injection Inject 20-40 Units into the skin See admin instructions. Inject 40 units into the skin in the morning and 20 units in the evening    . meclizine (ANTIVERT) 25 MG tablet Take 25 mg by mouth at bedtime.     . metFORMIN (GLUCOPHAGE) 1000 MG tablet Take 1,000 mg by mouth 2 (two) times daily.     . metoprolol tartrate (LOPRESSOR) 25 MG tablet Take 25 mg by mouth 2 (two) times daily.      No current facility-administered medications for this visit.    Past Medical History:  Diagnosis Date  . Dyspnea   . HLD (hyperlipidemia)   . HTN (hypertension)   . Neuropathy   . Stroke ([place])   . Type 2 diabetes mellitus with diabetic polyneuropathy ([place])   . Visual loss    right eye    Past Surgical History:  Procedure Laterality Date  . ABDOMINAL HYSTERECTOMY    . BREAST BIOPSY  02/2019  . FLEXIBLE SIGMOIDOSCOPY N/A 06/04/2019   Procedure: FLEXIBLE SIGMOIDOSCOPY;  Surgeon: Daneil Dolin, MD;  [facility];  Service: Endoscopy;  Laterality: N/A;  incomplete colonoscopy, poor prep  . IR CT HEAD LTD  08/04/2019  . IR PERCUTANEOUS ART THROMBECTOMY/INFUSION INTRACRANIAL  INC DIAG ANGIO  08/04/2019  . LOOP RECORDER INSERTION N/A 08/06/2019   Procedure: LOOP RECORDER INSERTION;  Surgeon: Thompson Grayer, MD;  Location: [place] [facility];  Service: Cardiovascular;  Laterality: N/A;  . RADIOLOGY WITH ANESTHESIA N/A 08/04/2019   Procedure: IR WITH ANESTHESIA;  Surgeon: Radiologist, Medication, MD;  Location: [place];  Service: Radiology;  Laterality: N/A;    Social History   Socioeconomic History  . Marital status: Unknown    Spouse name: Not on file  . Number of children: Not on file  . Years of education: Not  on file  . Highest education level: Not on file  Occupational History  . Not on file  Tobacco Use  . Smoking status: Never Smoker  . Smokeless tobacco: Never Used  Substance and Sexual Activity  . Alcohol use: Never  . Drug use: Never  . Sexual activity: Not Currently  Other Topics Concern  . Not on file  Social History Narrative  . Not on file   Social Determinants of Health   Financial Resource Strain:   . Difficulty of Paying Living Expenses:   Food Insecurity:   . Worried About Charity fundraiser in the Last Year:   . Arboriculturist in the Last Year:   Transportation Needs:   . Film/video editor (Medical):   Marland Kitchen Lack of Transportation (Non-Medical):   Physical Activity:   . Days of Exercise per Week:   . Minutes of Exercise per Session:   Stress:   . Feeling of Stress :   Social Connections:   . Frequency of Communication with Friends and Family:   . Frequency of Social Gatherings with Friends and Family:   . Attends Religious Services:   . Active Member of Clubs or Organizations:   . Attends Archivist Meetings:   Marland Kitchen Marital Status:   Intimate Partner Violence:   . Fear of Current or Ex-Partner:   . Emotionally Abused:   Marland Kitchen Physically Abused:   . Sexually Abused:      No family history of premature CAD in 1st degree relatives.  Current Meds  Medication Sig  . albuterol (PROVENTIL) (2.5 MG/3ML) 0.083% nebulizer solution Inhale 2.5 mg into the lungs every 6 (six) hours as needed for wheezing or shortness of breath.   Marland Kitchen amitriptyline (ELAVIL) 50 MG tablet Take 50 mg by mouth at bedtime.   Marland Kitchen aspirin 325 MG tablet Take 1 tablet (325 mg total) by mouth daily.  Marland Kitchen atorvastatin (LIPITOR) 40 MG tablet Take 1 tablet (40 mg total) by mouth daily at 6 PM.  . gabapentin (NEURONTIN) 400 MG capsule Take 400 mg by mouth 3 (three) times daily.   Marland Kitchen HUMULIN N 100 UNIT/ML injection Inject 20-40 Units into the skin See admin instructions. Inject 40 units into the  skin in the morning and 20 units in the evening  . meclizine (ANTIVERT) 25 MG tablet Take 25 mg by mouth at bedtime.   . metFORMIN (GLUCOPHAGE) 1000 MG tablet Take 1,000 mg by mouth 2 (two) times daily.   . metoprolol tartrate (LOPRESSOR) 25 MG tablet Take 25 mg by mouth 2 (two) times daily.       Review of systems complete and found to be negative unless listed above in HPI   Barbarann Ehlers, RN was present throughout the entirety of the encounter.  Physical exam Blood pressure (!) 134/58, pulse 72, temperature (!) 96.2 F (35.7 C), height 5\' 5"  (1.651 m), weight 216 lb (  98 kg), SpO2 97 %. General: NAD Neck: No JVD, no thyromegaly or thyroid nodule.  Lungs: Clear to auscultation bilaterally with normal respiratory effort. CV: Nondisplaced PMI. Regular rate and rhythm, normal S1/S2, no S3/S4, no murmur.  No peripheral edema.  No carotid bruit.    Abdomen: Soft, nontender, no distention.  Skin: Intact without lesions or rashes.  Neurologic: Alert and oriented x 3.  Psych: Normal affect. Extremities: No clubbing or cyanosis.  HEENT: Normal.   ECG: Most recent ECG reviewed.   Labs: Lab Results  Component Value Date/Time   K 3.9 10/24/2019 07:26 PM   BUN 10 10/24/2019 07:26 PM   CREATININE 0.80 10/24/2019 07:26 PM   ALT 12 08/04/2019 01:46 PM   HGB 11.3 (L) 10/24/2019 07:26 PM     Lipids: Lab Results  Component Value Date/Time   LDLCALC 129 (H) 08/05/2019 05:00 AM   CHOL 184 08/05/2019 05:00 AM   TRIG 40 08/05/2019 05:00 AM   TRIG 41 08/05/2019 05:00 AM   HDL 47 08/05/2019 05:00 AM        ASSESSMENT AND PLAN:   1.  CVA: She is taking aspirin and atorvastatin.  She is also on a beta-blocker.  She has episodic dizziness.  She denies syncope.  She has an implantable loop recorder which thus far has not demonstrated any evidence of arrhythmias.  2.  Hyperlipidemia: Lipids reviewed above.  Currently on atorvastatin.  3.  Hypertension: Blood pressure is normal.  No  changes to therapy.   Disposition: Follow up with Dr. Rayann Heman as scheduled.  Follow-up with me as needed.   Signed: Kate Sable, M.D., F.A.C.C.  10/27/2019, 9:12 AM

## 2019-10-27 NOTE — Patient Instructions (Addendum)
Medication Instructions: Your physician recommends that you continue on your current medications as directed. Please refer to the Current Medication list given to you today.   Labwork: None today  Procedures/Testing: None today  Follow-Up: as needed with Dr.Koneswaran  Any Additional Special Instructions Will Be Listed Below (If Applicable).     If you need a refill on your cardiac medications before your next appointment, please call your pharmacy.     Thank you for choosing [facility] !

## 2019-11-06 ENCOUNTER — Other Ambulatory Visit: Payer: Self-pay

## 2019-11-06 NOTE — Patient Outreach (Signed)
Telephone outreach to patient to obtain mRS was successfully completed with caregiver. MRS=3  Jean Watts [facility] Management Assistant [phone]

## 2019-11-16 ENCOUNTER — Ambulatory Visit (INDEPENDENT_AMBULATORY_CARE_PROVIDER_SITE_OTHER): Payer: 59 | Admitting: *Deleted

## 2019-11-16 DIAGNOSIS — I63512 Cerebral infarction due to unspecified occlusion or stenosis of left middle cerebral artery: Secondary | ICD-10-CM | POA: Diagnosis not present

## 2019-11-16 LAB — CUP PACEART REMOTE DEVICE CHECK
Date Time Interrogation Session: 20210523230349
Implantable Pulse Generator Implant Date: 20210211

## 2019-11-16 NOTE — Progress Notes (Signed)
Carelink Summary Report / Loop Recorder 

## 2019-11-18 ENCOUNTER — Other Ambulatory Visit: Payer: Self-pay | Admitting: Surgery

## 2019-11-18 DIAGNOSIS — D241 Benign neoplasm of right breast: Secondary | ICD-10-CM

## 2019-12-14 ENCOUNTER — Other Ambulatory Visit: Payer: Self-pay

## 2019-12-14 ENCOUNTER — Encounter (HOSPITAL_BASED_OUTPATIENT_CLINIC_OR_DEPARTMENT_OTHER): Payer: Self-pay | Admitting: Surgery

## 2019-12-14 ENCOUNTER — Ambulatory Visit: Payer: 59 | Admitting: Adult Health

## 2019-12-14 ENCOUNTER — Encounter: Payer: Self-pay | Admitting: Adult Health

## 2019-12-14 NOTE — Progress Notes (Signed)
Chart reviewed by Dr. Ola Spurr. Patient will need cardiac clearance prior to surgery. Once clearance is completed, ok to proceed with surgery as planned at [facility].   Call placed to Dr. Adela Ports office, left vm for [facility] requesting cardiology clearance.

## 2019-12-14 NOTE — Progress Notes (Deleted)
[facility] [address] [place]. [place] [address] [phone]       STROKE FOLLOW UP NOTE  [name] Date of Birth:  27-Dec-1940 Medical Record Number:  [id number]   Reason for Referral: stroke follow up    CHIEF COMPLAINT:  No chief complaint on file.   HPI:   Today, 12/14/2019, [name] returns for follow-up regarding cryptogenic left MCA stroke s/p IR in 07/2019.  She has been stable from a stroke standpoint since prior visit with residual cognitive and gait impairment.  Denies new stroke/TIA symptoms.  Loop recorder has not shown atrial fibrillation thus far.  Continue on aspirin and atorvastatin for secondary stroke prevention.  Blood pressure today ***.  Glucose levels ***.  Since prior visit, ED evaluation on 10/24/2019 for dizziness and headaches that appear more closely related to known gait instability, denies worsening symptoms.  CT head negative.  Recommended follow-up outpatient for further management. No further concerns at this time.    History provided for reference purposes only Initial visit 09/15/2019 JM: [name] is a 79 year old female who is being seen today for stroke follow-up accompanied by her daughter.  She has been stable from a stroke standpoint with residual mild cognitive impairment and gait impairment but overall improving.  Evidence of mild aphasia and dysarthria but per daughter, patient's baseline.  Currently ambulating with a cane and ongoing participation in home health physical therapy.  Continues to reside with daughter who helps assist minimally with ADLs.  Continues on aspirin and atorvastatin for secondary stroke prevention without side effects.  Blood pressure today 144/75.  Glucose levels stable.  Loop recorder has not shown atrial fibrillation thus far.  No concerns at this time.  Stroke admission 08/04/2019: Jean Watts being seen today for in office hospital follow-up regarding cryptogenic left MCA  infarct on 08/04/2019.  History obtained from patient, daughter and chart review. Reviewed all radiology images and labs personally.  Ms.Jean Watts a 79 y.o.femalewith history of hypertension, diabetes mellitus, hyperlipidemia, prior"stroke"in the right eye, neuropathywho presented on 08/04/2019 to [facility] and R sided weakness.Transferred to [facility] after hyperdense L M1 found for IR. Evaluated by stroke team and Dr. Erlinda Hong with stroke work-up revealing small left MCA infarcts s/p IR w/ TICI 3 revascularization L M1 occlusion, infarct embolic secondary to unknown source suspicion for occult AF.  Loop recorder was placed to rule out AF as possible stroke etiology.  Dense bilateral ICA cavernous arthrosclerosis without focal stenosis and minimal arthrosclerosis of bifurcations with tortuous ICAs but left MCA patent post IR.  Recommended DAPT for 3 weeks and aspirin alone.  History of HTN requiring Cleviprex during admission holding BP meds at discharge and advised to follow-up with PCP for long-term BP goal normotensive range.  LDL 129 initiate atorvastatin 40 mg daily.  History of DM uncontrolled with A1c 7.2 with diabetic polyneuropathy resuming home medications and advise close PCP follow-up.  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  Other active problems include mild anemia due to chronic disease, resolved acute respiratory failure and resolved leukocytosis.  Discharged home in stable condition on 08/06/2019 with recommendation of home health therapies.  Stroke:Small L MCAinfarcts s/p IR w/ TICI3 revascularization L M1 occlusion, infarctembolic secondary to unknownsource, suspicious foroccultAF  Code Stroke CT head posterior L insular ribbon and posterior uper ganglionic L frontal lobe infarcts. Hyperdense L M1.Old ischemic changes B thalami. ASPECTS7  CTA head &  neckL MCA bifurcation occlusionwith good collaterals.Dense B ICA cavernous atherosclerosis w/o focal stenosis; minimal atherosclerosis at bifurcaitons. Tortuous ICAs.   CT perfusionpositive penumbra. No significant core.   Cerebral angio L M1 w/ proximal superior and inferior division occlusion w/ TICI3 revascularization  MRISmall infarct L insular ribbon and medial L temporal lobe. Punctate subcortical L frontal lobe infarct. Small vessel disease.  MRALeft MCA patent post IR  LE DopplerNeg DVT  2D EchoEF60-65%. No source of embolus  Loop recorderinserted 08/06/2019 ([facility]) to look for AF as possible source of stroke  LDL129  HgbA1c7.3         ROS:   14 system review of systems performed and negative with exception of memory loss, visual loss, gait impairment  PMH:  Past Medical History:  Diagnosis Date  . Dyspnea   . HLD (hyperlipidemia)   . HTN (hypertension)   . Neuropathy   . Stroke ([place])   . Type 2 diabetes mellitus with diabetic polyneuropathy ([place])   . Visual loss    right eye    PSH:  Past Surgical History:  Procedure Laterality Date  . ABDOMINAL HYSTERECTOMY    . BREAST BIOPSY  02/2019  . FLEXIBLE SIGMOIDOSCOPY N/A 06/04/2019   Procedure: FLEXIBLE SIGMOIDOSCOPY;  Surgeon: Daneil Dolin, MD;  [facility];  Service: Endoscopy;  Laterality: N/A;  incomplete colonoscopy, poor prep  . IR CT HEAD LTD  08/04/2019  . IR PERCUTANEOUS ART THROMBECTOMY/INFUSION INTRACRANIAL INC DIAG ANGIO  08/04/2019  . LOOP RECORDER INSERTION N/A 08/06/2019   Procedure: LOOP RECORDER INSERTION;  Surgeon: Thompson Grayer, MD;  Location: [place] [facility];  Service: Cardiovascular;  Laterality: N/A;  . RADIOLOGY WITH ANESTHESIA N/A 08/04/2019   Procedure: IR WITH ANESTHESIA;  Surgeon: Radiologist, Medication, MD;  Location: [place];  Service: Radiology;  Laterality: N/A;    Social History:  Social History   Socioeconomic History  . Marital status:  Unknown    Spouse name: Not on file  . Number of children: Not on file  . Years of education: Not on file  . Highest education level: Not on file  Occupational History  . Not on file  Tobacco Use  . Smoking status: Never Smoker  . Smokeless tobacco: Never Used  Vaping Use  . Vaping Use: Never used  Substance and Sexual Activity  . Alcohol use: Never  . Drug use: Never  . Sexual activity: Not Currently  Other Topics Concern  . Not on file  Social History Narrative  . Not on file   Social Determinants of Health   Financial Resource Strain:   . Difficulty of Paying Living Expenses:   Food Insecurity:   . Worried About Charity fundraiser in the Last Year:   . Arboriculturist in the Last Year:   Transportation Needs:   . Film/video editor (Medical):   Marland Kitchen Lack of Transportation (Non-Medical):   Physical Activity:   . Days of Exercise per Week:   . Minutes of Exercise per Session:   Stress:   . Feeling of Stress :   Social Connections:   . Frequency of Communication with Friends and Family:   . Frequency of Social Gatherings with Friends and Family:   . Attends Religious Services:   . Active Member of Clubs or Organizations:   . Attends Archivist Meetings:   Marland Kitchen Marital Status:   Intimate Partner Violence:   . Fear of Current or Ex-Partner:   .  Emotionally Abused:   Marland Kitchen Physically Abused:   . Sexually Abused:     Family History:  Family History  Problem Relation Age of Onset  . Cancer Mother   . Heart attack Father   . Prostate cancer Brother   . Lung cancer Brother   . Colon cancer Neg Hx   . Colon polyps Neg Hx     Medications:   Current Outpatient Medications on File Prior to Visit  Medication Sig Dispense Refill  . albuterol (PROVENTIL) (2.5 MG/3ML) 0.083% nebulizer solution Inhale 2.5 mg into the lungs every 6 (six) hours as needed for wheezing or shortness of breath.     Marland Kitchen amitriptyline (ELAVIL) 50 MG tablet Take 50 mg by mouth at bedtime.      Marland Kitchen aspirin 325 MG tablet Take 1 tablet (325 mg total) by mouth daily.    Marland Kitchen atorvastatin (LIPITOR) 40 MG tablet Take 1 tablet (40 mg total) by mouth daily at 6 PM. 30 tablet 2  . gabapentin (NEURONTIN) 400 MG capsule Take 400 mg by mouth 3 (three) times daily.     Marland Kitchen HUMULIN N 100 UNIT/ML injection Inject 20-40 Units into the skin See admin instructions. Inject 40 units into the skin in the morning and 20 units in the evening    . meclizine (ANTIVERT) 25 MG tablet Take 25 mg by mouth at bedtime.     . metFORMIN (GLUCOPHAGE) 1000 MG tablet Take 1,000 mg by mouth 2 (two) times daily.     . metoprolol tartrate (LOPRESSOR) 25 MG tablet Take 25 mg by mouth 2 (two) times daily.      No current facility-administered medications on file prior to visit.    Allergies:  No Known Allergies   Physical Exam  There were no vitals filed for this visit. There is no height or weight on file to calculate BMI. No exam data present  General: well developed, well nourished,  pleasant elderly African-American female, seated, in no evident distress Head: head normocephalic and atraumatic.   Neck: supple with no carotid or supraclavicular bruits Cardiovascular: regular rate and rhythm, no murmurs Musculoskeletal: no deformity Skin:  no rash/petichiae Vascular:  Normal pulses all extremities   Neurologic Exam Mental Status: Awake and fully alert.   Mild expressive aphasia and dysarthria (per daughter, patient's baseline).  Oriented to place and time. Recent memory impaired and remote memory intact. Attention span, concentration and fund of knowledge slightly impaired with daughter providing input or correction when needed. Mood and affect appropriate.  Cranial Nerves: Left eye pupil briskly reactive to light.  Right visual loss with corneal whitening.  Extraocular movements full without nystagmus. Visual fields full to confrontation. Hearing intact. Facial sensation intact.  Mild right lower facial  weakness. Motor: Normal bulk and tone. Normal strength in all tested extremity muscles. Sensory.: intact to touch , pinprick , position and vibratory sensation.  Coordination: Rapid alternating movements normal in all extremities. Finger-to-nose and heel-to-shin performed accurately bilaterally. Gait and Station: Arises from chair without difficulty. Stance is normal. Gait demonstrates normal stride length with mild imbalance and use of cane Reflexes: 1+ and symmetric. Toes downgoing.       ASSESSMENT: Abbagale Goguen is a 79 y.o. 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  Recovered well from stroke standpoint with residual imbalance and right lower facial weakness but daughter reports ongoing improvement.  Evidence of speech and language impairment but per daughter, patient's baseline    PLAN:  1. Right MCA stroke: Continue aspirin 81 mg daily  and atorvastatin for secondary stroke prevention. Maintain strict control of hypertension with blood pressure goal below 130/90, diabetes with hemoglobin A1c goal below 6.5% and cholesterol with LDL cholesterol (bad cholesterol) goal below 70 mg/dL.  I also advised the patient to eat a healthy diet with plenty of whole grains, cereals, fruits and vegetables, exercise regularly with at least 30 minutes of continuous activity daily and maintain ideal body weight.  Continue to monitor loop recorder for possible atrial fibrillation.  Continue ongoing participation in home health therapies and advised to notify office if additional outpatient  therapy is needed 2. Left M1 occlusion s/p IR: Continuation of aspirin and statin.  We will repeat imaging at follow-up visit for surveillance monitoring 3. HTN: Advised to continue current treatment regimen.  Today's BP stable.  Advised to continue to monitor at home along with continued follow-up with PCP for management 4. HLD: Advised to continue current treatment regimen along with continued follow-up with PCP for future prescribing and monitoring of lipid panel 5. DMII: Advised to continue to monitor glucose levels at home along with continued follow-up with PCP for management and monitoring 6. Patient interested in additional information regarding [facility] trial.  Notified [facility] research team for further discussion with patient and daughter.    Follow up in 3 months or call earlier if needed   Greater than 50% of time during this 45 minute visit was spent on counseling, explanation of diagnosis of cryptogenic stroke, reviewing risk factor management of HTN, HLD and DM, planning of further management along with potential future management, and discussion with patient and family answering all questions.    Frann Rider, AGNP-BC  [facility] [address] [place], [place] [address]  Phone [phone] Fax [phone] Note: This document was prepared with digital dictation and possible smart phrase technology. Any transcriptional errors that result from this process are unintentional.

## 2019-12-17 ENCOUNTER — Telehealth: Payer: Self-pay | Admitting: *Deleted

## 2019-12-17 NOTE — Telephone Encounter (Signed)
   [facility] Pre-operative Risk Assessment    HEARTCARE STAFF: - Please ensure there is not already an duplicate clearance open for this procedure. - Under Visit Info/Reason for Call, type in Other and utilize the format Clearance MM/DD/YY or Clearance TBD. Do not use dashes or single digits. - If request is for dental extraction, please clarify the # of teeth to be extracted.  Request for surgical clearance:  1. What type of surgery is being performed? RIGHT BREAST LUMPECTOMY   2. When is this surgery scheduled? TBD   3. What type of clearance is required (medical clearance vs. Pharmacy clearance to hold med vs. Both)? MEDICAL  4. Are there any medications that need to be held prior to surgery and how long? ASA   5. Practice name and name of physician performing surgery? [facility]; DR. Coralie Keens   6. What is the office phone number? [phone]   7.   What is the office fax number? [place]: Malachi Bonds, CMA  8.   Anesthesia type (None, local, MAC, general) ? GENERAL   Julaine Hua 12/17/2019, 5:05 PM  _________________________________________________________________   (provider comments below)

## 2019-12-18 ENCOUNTER — Other Ambulatory Visit (HOSPITAL_COMMUNITY)
Admission: RE | Admit: 2019-12-18 | Discharge: 2019-12-18 | Disposition: A | Discharge status: Home / Self Care | Payer: 59 | Source: Ambulatory Visit | Attending: Surgery | Admitting: Surgery

## 2019-12-18 DIAGNOSIS — Z20822 Contact with and (suspected) exposure to covid-19: Secondary | ICD-10-CM | POA: Insufficient documentation

## 2019-12-18 DIAGNOSIS — Z01812 Encounter for preprocedural laboratory examination: Secondary | ICD-10-CM | POA: Diagnosis present

## 2019-12-18 LAB — SARS CORONAVIRUS 2 (TAT 6-24 HRS): SARS Coronavirus 2: NEGATIVE

## 2019-12-18 NOTE — Telephone Encounter (Signed)
   Primary Cardiologist: Kate Sable, MD  Chart reviewed as part of pre-operative protocol coverage. Patient was contacted 12/18/2019 in reference to pre-operative risk assessment for pending surgery as outlined below.  Jean Watts was last seen on 10/27/2019 by Dr. Bronson Ing.  Since that day, Jean Watts has done well without chest pain or shortness of breath. She has recovered from the stroke quite well. She has no prior h/o CAD. Last echo in 07/2019 showed normal EF. Recent loop recorder interrogation showed no significant arrhythmia.   Therefore, based on ACC/AHA guidelines, the patient would be at acceptable risk for the planned procedure without further cardiovascular testing.   I will route this recommendation to the requesting party via Epic fax function and remove from pre-op pool. Please call with questions.  Patient is not on aspirin for cardiac reasons, she is on high dose aspirin due to stroke in Feb 2021. Will need to check with neurology service to see if she can hold it.   [place], [place] 12/18/2019, 5:41 PM

## 2019-12-21 ENCOUNTER — Ambulatory Visit
Admission: RE | Admit: 2019-12-21 | Discharge: 2019-12-21 | Disposition: A | Discharge status: Home / Self Care | Payer: 59 | Source: Ambulatory Visit | Attending: Surgery | Admitting: Surgery

## 2019-12-21 ENCOUNTER — Other Ambulatory Visit: Payer: Self-pay

## 2019-12-21 ENCOUNTER — Other Ambulatory Visit: Payer: Self-pay | Admitting: Surgery

## 2019-12-21 ENCOUNTER — Ambulatory Visit (INDEPENDENT_AMBULATORY_CARE_PROVIDER_SITE_OTHER): Payer: 59 | Admitting: *Deleted

## 2019-12-21 DIAGNOSIS — I63512 Cerebral infarction due to unspecified occlusion or stenosis of left middle cerebral artery: Secondary | ICD-10-CM

## 2019-12-21 DIAGNOSIS — D241 Benign neoplasm of right breast: Secondary | ICD-10-CM

## 2019-12-21 LAB — CUP PACEART REMOTE DEVICE CHECK
Date Time Interrogation Session: 20210627230506
Implantable Pulse Generator Implant Date: 20210211

## 2019-12-21 NOTE — Progress Notes (Signed)
Hassan Rowan at Dr Trevor Mace notified that we need neurology clearance for pt to hold ASA. Dr Leonie Man is her neurologist.

## 2019-12-21 NOTE — H&P (Signed)
Jean Watts  Location: [facility] Patient #: [id number] DOB: Oct 22, 1940 Widowed / Language: [place] / Race: Black or African American Female   History of Present Illness  The patient is a 79 year old female who presents with a breast mass. This is a pleasant 79 year old female referred by Dr. Rosita Fire for evaluation of several abnormalities of the right breast. She is having had a benign lumpectomy many years ago in the left breast. She denies nipple discharge. She had 2 areas biopsied in the right breast. Both showed papillary lesions with sclerosing and calcifications and hyperplasia. Lumpectomy of both of these areas and recommended. She is otherwise without complaints.   Past Surgical History Malachi Bonds, CMA Breast Biopsy  Left. Hysterectomy (not due to cancer) - Complete   Diagnostic Studies History (Chemira Jones, CMA;PM) Colonoscopy  5-10 years ago Mammogram  within last year  Allergies Malachi Bonds, CMA;  No Known Drug Allergies  [04/07/2019]:  Medication History Malachi Bonds, CMA; Albuterol Sulfate ((2.5 MG/3ML)0.083% Nebulized Soln, Inhalation) Active. Amitriptyline HCl (50MG  Tablet, Oral) Active. amLODIPine Besy-Benazepril HCl (5-10MG  Capsule, Oral) Active. FeroSul (325 (65 Fe)MG Tablet, Oral) Active. Gabapentin (300MG  Capsule, Oral) Active. HumuLIN N (100UNIT/ML Suspension, Subcutaneous) Active. Meclizine HCl (25MG  Tablet, Oral) Active. metFORMIN HCl (1000MG  Tablet, Oral) Active. Metoprolol Tartrate (25MG  Tablet, Oral) Active. Simvastatin (20MG  Tablet, Oral) Active. Medications Reconciled  Social History Malachi Bonds, CMA; Alcohol use  Remotely quit alcohol use. Caffeine use  Coffee. No drug use  Tobacco use  Never smoker.  Family History Malachi Bonds, [place] Alcohol Abuse  Father, Sister. Arthritis  Daughter, Father, Mother. Breast Cancer  Daughter. Depression  Daughter. Hypertension  Daughter,  Father.  Pregnancy / Birth History Malachi Bonds, CMA; Age at menarche  5 years. Age of menopause  <47 Contraceptive History  Oral contraceptives. Gravida  1 Maternal age  77-25 Para  1 Regular periods   Other Problems (Chemira Jones, CMA;  Diabetes Mellitus     Review of Systems Malachi Bonds CMA;  General Not Present- Appetite Loss, Chills, Fatigue, Fever, Night Sweats, Weight Gain and Weight Loss. HEENT Present- Hearing Loss and Visual Disturbances. Not Present- Earache, Hoarseness, Nose Bleed, Oral Ulcers, Ringing in the Ears, Seasonal Allergies, Sinus Pain, Sore Throat, Wears glasses/contact lenses and Yellow Eyes. Respiratory Present- Chronic Cough. Not Present- Bloody sputum, Difficulty Breathing, Snoring and Wheezing. Cardiovascular Present- Leg Cramps and Swelling of Extremities. Not Present- Chest Pain, Difficulty Breathing Lying Down, Palpitations, Rapid Heart Rate and Shortness of Breath. Gastrointestinal Present- Constipation and Excessive gas. Not Present- Abdominal Pain, Bloating, Bloody Stool, Change in Bowel Habits, Chronic diarrhea, Difficulty Swallowing, Gets full quickly at meals, Hemorrhoids, Indigestion, Nausea, Rectal Pain and Vomiting. Female Genitourinary Not Present- Frequency, Nocturia, Painful Urination, Pelvic Pain and Urgency. Musculoskeletal Present- Joint Pain. Not Present- Back Pain, Joint Stiffness, Muscle Pain, Muscle Weakness and Swelling of Extremities. Neurological Present- Headaches. Not Present- Decreased Memory, Fainting, Numbness, Seizures, Tingling, Tremor, Trouble walking and Weakness. Hematology Not Present- Blood Thinners, Easy Bruising, Excessive bleeding, Gland problems, HIV and Persistent Infections.  Vitals   Weight: 221.2 lb Height: 64in Body Surface Area: 2.04 m Body Mass Index: 37.97 kg/m  Pulse: 73 (Regular)  BP: 132/78(Sitting, Left Arm, Standard)      Physical Exam  General Mental  Status-Alert. General Appearance-Consistent with stated age. Hydration-Well hydrated. Voice-Normal.  Head and Neck Head-normocephalic, atraumatic with no lesions or palpable masses. Trachea-midline. Thyroid Gland Characteristics - normal size and consistency.  Eye Eyeball - Bilateral-Extraocular movements intact. Sclera/Conjunctiva -  Bilateral-No scleral icterus.  Chest and Lung Exam Chest and lung exam reveals -quiet, even and easy respiratory effort with no use of accessory muscles and on auscultation, normal breath sounds, no adventitious sounds and normal vocal resonance. Inspection Chest Wall - Normal. Back - normal.  Breast Breast - Left-Symmetric, Non Tender, No Biopsy scars, no Dimpling - Left, No Inflammation, No Lumpectomy scars, No Mastectomy scars, No Peau d' Orange. Breast - Right-Symmetric, Non Tender, No Biopsy scars, no Dimpling - Right, No Inflammation, No Lumpectomy scars, No Mastectomy scars, No Peau d' Orange. Breast Lump-No Palpable Breast Mass.  Cardiovascular Cardiovascular examination reveals -normal heart sounds, regular rate and rhythm with no murmurs and normal pedal pulses bilaterally.  Abdomen Inspection Inspection of the abdomen reveals - No Hernias. Skin - Scar - no surgical scars. Palpation/Percussion Palpation and Percussion of the abdomen reveal - Soft, Non Tender, No Rebound tenderness, No Rigidity (guarding) and No hepatosplenomegaly. Auscultation Auscultation of the abdomen reveals - Bowel sounds normal.  Neurologic - Did not examine.  Musculoskeletal - Did not examine.  Lymphatic Head & Neck  General Head & Neck Lymphatics: Bilateral - Description - Normal. Axillary  General Axillary Region: Bilateral - Description - Normal. Tenderness - Non Tender. Femoral & Inguinal - Did not examine.    Assessment & Plan  PAPILLOMA OF RIGHT BREAST (D24.1)  Impression: I reviewed the patient's mammograms,  ultrasound, and pathology results. I gave her a copy of the pathology results. A right breast radioactive seed guided lumpectomy 2 was recommended to remove both these areas for complete histologic evaluation for malignancy. I discussed the reasons for this with her. I discussed this procedure in detail. We discussed the risk which includes but is not limited to bleeding, infection, the need for further surgery if malignancy is found, cardiopulmonary issues, etc. She understands and wished to proceed with surgery which will be scheduled

## 2019-12-21 NOTE — Progress Notes (Signed)
Jean Watts at Dr Trevor Mace office called back saying that Dr Ninfa Linden does not require his patients to stop their ASA and Jean Watts is OK to continue her ASA for surgery.

## 2019-12-22 ENCOUNTER — Ambulatory Visit (HOSPITAL_BASED_OUTPATIENT_CLINIC_OR_DEPARTMENT_OTHER): Payer: 59 | Admitting: Anesthesiology

## 2019-12-22 ENCOUNTER — Encounter (HOSPITAL_BASED_OUTPATIENT_CLINIC_OR_DEPARTMENT_OTHER)
Admission: RE | Disposition: A | Discharge status: Home / Self Care | Payer: Self-pay | Source: Home / Self Care | Attending: Surgery

## 2019-12-22 ENCOUNTER — Ambulatory Visit
Admission: RE | Admit: 2019-12-22 | Discharge: 2019-12-22 | Disposition: A | Discharge status: Home / Self Care | Payer: 59 | Source: Ambulatory Visit | Attending: Surgery | Admitting: Surgery

## 2019-12-22 ENCOUNTER — Ambulatory Visit (HOSPITAL_BASED_OUTPATIENT_CLINIC_OR_DEPARTMENT_OTHER)
Admission: RE | Admit: 2019-12-22 | Discharge: 2019-12-22 | Disposition: A | Discharge status: Home / Self Care | Payer: 59 | Attending: Surgery | Admitting: Surgery

## 2019-12-22 ENCOUNTER — Encounter (HOSPITAL_BASED_OUTPATIENT_CLINIC_OR_DEPARTMENT_OTHER): Payer: Self-pay | Admitting: Surgery

## 2019-12-22 DIAGNOSIS — D241 Benign neoplasm of right breast: Secondary | ICD-10-CM | POA: Diagnosis not present

## 2019-12-22 DIAGNOSIS — Z79899 Other long term (current) drug therapy: Secondary | ICD-10-CM | POA: Insufficient documentation

## 2019-12-22 DIAGNOSIS — Z8673 Personal history of transient ischemic attack (TIA), and cerebral infarction without residual deficits: Secondary | ICD-10-CM | POA: Diagnosis not present

## 2019-12-22 DIAGNOSIS — I10 Essential (primary) hypertension: Secondary | ICD-10-CM | POA: Diagnosis not present

## 2019-12-22 DIAGNOSIS — Z794 Long term (current) use of insulin: Secondary | ICD-10-CM | POA: Diagnosis not present

## 2019-12-22 DIAGNOSIS — E119 Type 2 diabetes mellitus without complications: Secondary | ICD-10-CM | POA: Insufficient documentation

## 2019-12-22 DIAGNOSIS — N649 Disorder of breast, unspecified: Secondary | ICD-10-CM | POA: Diagnosis present

## 2019-12-22 HISTORY — PX: BREAST LUMPECTOMY WITH RADIOACTIVE SEED LOCALIZATION: SHX6424

## 2019-12-22 LAB — BASIC METABOLIC PANEL
Anion gap: 11 (ref 5–15)
BUN: 10 mg/dL (ref 8–23)
CO2: 18 mmol/L — ABNORMAL LOW (ref 22–32)
Calcium: 9.3 mg/dL (ref 8.9–10.3)
Chloride: 108 mmol/L (ref 98–111)
Creatinine, Ser: 0.78 mg/dL (ref 0.44–1.00)
GFR calc Af Amer: >60 mL/min (ref >60)
GFR calc non Af Amer: >60 mL/min (ref >60)
Glucose, Bld: 148 mg/dL — ABNORMAL HIGH (ref 70–99)
Potassium: 5.8 mmol/L — ABNORMAL HIGH (ref 3.5–5.1)
Sodium: 137 mmol/L (ref 135–145)

## 2019-12-22 LAB — GLUCOSE, CAPILLARY
Glucose-Capillary: 145 mg/dL — ABNORMAL HIGH (ref 70–99)
Glucose-Capillary: 179 mg/dL — ABNORMAL HIGH (ref 70–99)

## 2019-12-22 SURGERY — BREAST LUMPECTOMY WITH RADIOACTIVE SEED LOCALIZATION
Anesthesia: General | Site: Breast | Laterality: Right

## 2019-12-22 MED ORDER — ACETAMINOPHEN 500 MG PO TABS
1000.0000 mg | ORAL_TABLET | ORAL | Status: AC
  Administered 2019-12-22: 1000 mg via ORAL

## 2019-12-22 MED ORDER — CHLORHEXIDINE GLUCONATE CLOTH 2 % EX PADS: 6.0000 | MEDICATED_PAD | Freq: Once | CUTANEOUS | Status: DC

## 2019-12-22 MED ORDER — LIDOCAINE 2% (20 MG/ML) 5 ML SYRINGE
INTRAMUSCULAR | Status: AC
  Filled 2019-12-22: qty 5

## 2019-12-22 MED ORDER — LACTATED RINGERS IV SOLN: INTRAVENOUS | Status: DC | PRN

## 2019-12-22 MED ORDER — HYDROMORPHONE HCL 1 MG/ML IJ SOLN: 0.2500 mg | INTRAMUSCULAR | Status: DC | PRN

## 2019-12-22 MED ORDER — CEFAZOLIN SODIUM-DEXTROSE 2-4 GM/100ML-% IV SOLN
INTRAVENOUS | Status: AC
  Filled 2019-12-22: qty 100

## 2019-12-22 MED ORDER — CEFAZOLIN SODIUM-DEXTROSE 2-3 GM-%(50ML) IV SOLR
INTRAVENOUS | Status: DC | PRN
  Administered 2019-12-22: 2 g via INTRAVENOUS

## 2019-12-22 MED ORDER — TRAMADOL HCL 50 MG PO TABS: 50.0000 mg | ORAL_TABLET | Freq: Four times a day (QID) | ORAL | 0 refills | Status: DC | PRN

## 2019-12-22 MED ORDER — GABAPENTIN 300 MG PO CAPS
ORAL_CAPSULE | ORAL | Status: AC
  Filled 2019-12-22: qty 1

## 2019-12-22 MED ORDER — BUPIVACAINE HCL (PF) 0.5 % IJ SOLN
INTRAMUSCULAR | Status: DC | PRN
  Administered 2019-12-22: 20 mL

## 2019-12-22 MED ORDER — ACETAMINOPHEN 500 MG PO TABS
ORAL_TABLET | ORAL | Status: AC
  Filled 2019-12-22: qty 2

## 2019-12-22 MED ORDER — PROPOFOL 10 MG/ML IV BOLUS
INTRAVENOUS | Status: DC | PRN
  Administered 2019-12-22: 150 mg via INTRAVENOUS

## 2019-12-22 MED ORDER — OXYCODONE HCL 5 MG/5ML PO SOLN: 5.0000 mg | Freq: Once | ORAL | Status: DC | PRN

## 2019-12-22 MED ORDER — ONDANSETRON HCL 4 MG/2ML IJ SOLN
INTRAMUSCULAR | Status: DC | PRN
  Administered 2019-12-22: 4 mg via INTRAVENOUS

## 2019-12-22 MED ORDER — LIDOCAINE HCL (CARDIAC) PF 100 MG/5ML IV SOSY
PREFILLED_SYRINGE | INTRAVENOUS | Status: DC | PRN
  Administered 2019-12-22: 100 mg via INTRAVENOUS

## 2019-12-22 MED ORDER — GABAPENTIN 300 MG PO CAPS
300.0000 mg | ORAL_CAPSULE | ORAL | Status: AC
  Administered 2019-12-22: 300 mg via ORAL

## 2019-12-22 MED ORDER — FENTANYL CITRATE (PF) 100 MCG/2ML IJ SOLN
INTRAMUSCULAR | Status: DC | PRN
  Administered 2019-12-22: 50 ug via INTRAVENOUS

## 2019-12-22 MED ORDER — PROPOFOL 10 MG/ML IV BOLUS
INTRAVENOUS | Status: AC
  Filled 2019-12-22: qty 20

## 2019-12-22 MED ORDER — ONDANSETRON HCL 4 MG/2ML IJ SOLN
INTRAMUSCULAR | Status: AC
  Filled 2019-12-22: qty 2

## 2019-12-22 MED ORDER — PROMETHAZINE HCL 12.5 MG RE SUPP: 25.0000 mg | Freq: Once | RECTAL | Status: DC | PRN

## 2019-12-22 MED ORDER — CEFAZOLIN SODIUM-DEXTROSE 2-4 GM/100ML-% IV SOLN: 2.0000 g | INTRAVENOUS | Status: DC

## 2019-12-22 MED ORDER — DEXAMETHASONE SODIUM PHOSPHATE 10 MG/ML IJ SOLN
INTRAMUSCULAR | Status: AC
  Filled 2019-12-22: qty 1

## 2019-12-22 MED ORDER — OXYCODONE HCL 5 MG PO TABS: 5.0000 mg | ORAL_TABLET | Freq: Once | ORAL | Status: DC | PRN

## 2019-12-22 MED ORDER — FENTANYL CITRATE (PF) 100 MCG/2ML IJ SOLN
INTRAMUSCULAR | Status: AC
  Filled 2019-12-22: qty 2

## 2019-12-22 MED ORDER — LACTATED RINGERS IV SOLN: INTRAVENOUS | Status: DC

## 2019-12-22 SURGICAL SUPPLY — 45 items
APPLIER CLIP 9.375 MED OPEN (MISCELLANEOUS)
BINDER BREAST 3XL (GAUZE/BANDAGES/DRESSINGS) IMPLANT
BINDER BREAST LRG (GAUZE/BANDAGES/DRESSINGS) IMPLANT
BINDER BREAST MEDIUM (GAUZE/BANDAGES/DRESSINGS) IMPLANT
BINDER BREAST XLRG (GAUZE/BANDAGES/DRESSINGS) IMPLANT
BINDER BREAST XXLRG (GAUZE/BANDAGES/DRESSINGS) ×2 IMPLANT
BLADE SURG 15 STRL LF DISP TIS (BLADE) ×1 IMPLANT
BLADE SURG 15 STRL SS (BLADE) ×1
CANISTER SUC SOCK COL 7IN (MISCELLANEOUS) IMPLANT
CANISTER SUCT 1200ML W/VALVE (MISCELLANEOUS) IMPLANT
CHLORAPREP W/TINT 26 (MISCELLANEOUS) ×2 IMPLANT
CLIP APPLIE 9.375 MED OPEN (MISCELLANEOUS) IMPLANT
COVER BACK TABLE 60X90IN (DRAPES) ×2 IMPLANT
COVER MAYO STAND STRL (DRAPES) ×2 IMPLANT
COVER PROBE W GEL 5X96 (DRAPES) ×2 IMPLANT
COVER WAND RF STERILE (DRAPES) IMPLANT
DECANTER SPIKE VIAL GLASS SM (MISCELLANEOUS) IMPLANT
DERMABOND ADVANCED (GAUZE/BANDAGES/DRESSINGS) ×1
DERMABOND ADVANCED .7 DNX12 (GAUZE/BANDAGES/DRESSINGS) ×1 IMPLANT
DRAPE LAPAROSCOPIC ABDOMINAL (DRAPES) ×2 IMPLANT
DRAPE UTILITY XL STRL (DRAPES) ×2 IMPLANT
ELECT REM PT RETURN 9FT ADLT (ELECTROSURGICAL) ×2
ELECTRODE REM PT RTRN 9FT ADLT (ELECTROSURGICAL) ×1 IMPLANT
GAUZE SPONGE 4X4 12PLY STRL LF (GAUZE/BANDAGES/DRESSINGS) IMPLANT
GLOVE SURG SIGNA 7.5 PF LTX (GLOVE) ×2 IMPLANT
GOWN STRL REUS W/ TWL LRG LVL3 (GOWN DISPOSABLE) ×1 IMPLANT
GOWN STRL REUS W/ TWL XL LVL3 (GOWN DISPOSABLE) ×1 IMPLANT
GOWN STRL REUS W/TWL LRG LVL3 (GOWN DISPOSABLE) ×1
GOWN STRL REUS W/TWL XL LVL3 (GOWN DISPOSABLE) ×1
KIT MARKER MARGIN INK (KITS) ×2 IMPLANT
NEEDLE HYPO 25X1 1.5 SAFETY (NEEDLE) ×2 IMPLANT
NS IRRIG 1000ML POUR BTL (IV SOLUTION) ×2 IMPLANT
PENCIL SMOKE EVACUATOR (MISCELLANEOUS) ×2 IMPLANT
SET BASIN DAY SURGERY F.S. (CUSTOM PROCEDURE TRAY) ×2 IMPLANT
SLEEVE SCD COMPRESS KNEE MED (MISCELLANEOUS) ×2 IMPLANT
SPONGE LAP 4X18 RFD (DISPOSABLE) ×2 IMPLANT
SUT MNCRL AB 4-0 PS2 18 (SUTURE) ×2 IMPLANT
SUT SILK 2 0 SH (SUTURE) IMPLANT
SUT VIC AB 3-0 SH 27 (SUTURE) ×1
SUT VIC AB 3-0 SH 27X BRD (SUTURE) ×1 IMPLANT
SYR CONTROL 10ML LL (SYRINGE) ×2 IMPLANT
TOWEL GREEN STERILE FF (TOWEL DISPOSABLE) ×2 IMPLANT
TRAY FAXITRON CT DISP (TRAY / TRAY PROCEDURE) ×2 IMPLANT
TUBE CONNECTING 20X1/4 (TUBING) IMPLANT
YANKAUER SUCT BULB TIP NO VENT (SUCTIONS) IMPLANT

## 2019-12-22 NOTE — Anesthesia Postprocedure Evaluation (Signed)
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  Procedure(s) Performed: RIGHT BREAST LUMPECTOMY X 2 WITH RADIOACTIVE SEED LOCALIZATION (Right Breast)     Patient location during evaluation: PACU Anesthesia Type: General Level of consciousness: awake and alert Pain management: pain level controlled Vital Signs Assessment: post-procedure vital signs reviewed and stable Respiratory status: spontaneous breathing, nonlabored ventilation and respiratory function stable Cardiovascular status: blood pressure returned to baseline and stable Postop Assessment: no apparent nausea or vomiting Anesthetic complications: no   No complications documented.  Last Vitals:  Vitals:   12/22/19 1045 12/22/19 1102  BP: 131/62 124/89  Pulse: 68 83  Resp: 18 18  Temp:  36.6 C  SpO2: 97% 100%    Last Pain:  Vitals:   12/22/19 1102  TempSrc: Oral  PainSc: 0-No pain                 Lynda Rainwater

## 2019-12-22 NOTE — Discharge Instructions (Signed)
[place] Office Phone Number [phone]  BREAST BIOPSY/ PARTIAL MASTECTOMY: POST OP INSTRUCTIONS  Always review your discharge instruction sheet given to you by the facility where your surgery was performed.  IF YOU HAVE DISABILITY OR FAMILY LEAVE FORMS, YOU MUST BRING THEM TO THE OFFICE FOR PROCESSING.  DO NOT GIVE THEM TO YOUR DOCTOR.  1. A prescription for pain medication may be given to you upon discharge.  Take your pain medication as prescribed, if needed.  If narcotic pain medicine is not needed, then you may take acetaminophen (Tylenol) or ibuprofen (Advil) as needed. 2. Take your usually prescribed medications unless otherwise directed 3. If you need a refill on your pain medication, please contact your pharmacy.  They will contact our office to request authorization.  Prescriptions will not be filled after 5pm or on week-ends. 4. You should eat very light the first 24 hours after surgery, such as soup, crackers, pudding, etc.  Resume your normal diet the day after surgery. 5. Most patients will experience some swelling and bruising in the breast.  Ice packs and a good support bra will help.  Swelling and bruising can take several days to resolve.  6. It is common to experience some constipation if taking pain medication after surgery.  Increasing fluid intake and taking a stool softener will usually help or prevent this problem from occurring.  A mild laxative (Milk of Magnesia or Miralax) should be taken according to package directions if there are no bowel movements after 48 hours. 7. Unless discharge instructions indicate otherwise, you may remove your bandages 24-48 hours after surgery, and you may shower at that time.  You may have steri-strips (small skin tapes) in place directly over the incision.  These strips should be left on the skin for 7-10 days.  If your surgeon used skin glue on the incision, you may shower in 24 hours.  The glue will flake off over the  next 2-3 weeks.  Any sutures or staples will be removed at the office during your follow-up visit. 8. ACTIVITIES:  You may resume regular daily activities (gradually increasing) beginning the next day.  Wearing a good support bra or sports bra minimizes pain and swelling.  You may have sexual intercourse when it is comfortable. a. You may drive when you no longer are taking prescription pain medication, you can comfortably wear a seatbelt, and you can safely maneuver your car and apply brakes. b. RETURN TO WORK:  ______________________________________________________________________________________ 9. You should see your doctor in the office for a follow-up appointment approximately two weeks after your surgery.  Your doctor's nurse will typically make your follow-up appointment when she calls you with your pathology report.  Expect your pathology report 2-3 business days after your surgery.  You may call to check if you do not hear from [place] after three days. 10. OTHER INSTRUCTIONS:OK TO REMOVE THE BINDER AND START SHOWERING TOMORROW 11. ICE PACK, TYLENOL ALSO FOR PAIN 12. NO VIGOROUS ACTIVITY FOR ONE WEEK _______________________________________________________________________________________________ _____________________________________________________________________________________________________________________________________ _____________________________________________________________________________________________________________________________________ _____________________________________________________________________________________________________________________________________  WHEN TO CALL YOUR DOCTOR: 1. Fever over 101.0 2. Nausea and/or vomiting. 3. Extreme swelling or bruising. 4. Continued bleeding from incision. 5. Increased pain, redness, or drainage from the incision.  The clinic staff is available to answer your questions during regular business hours.  Please don't  hesitate to call and ask to speak to one of the nurses for clinical concerns.  If you have a medical emergency, go to the nearest emergency room or call 911.  A surgeon from [facility] is always on call at the hospital.  For further questions, please visit [email or web address]    No Tylenol before 1:45pm if needed  Post Anesthesia Home Care Instructions  Activity: Get plenty of rest for the remainder of the day. A responsible individual must stay with you for 24 hours following the procedure.  For the next 24 hours, DO NOT: -Drive a car -Paediatric nurse -Drink alcoholic beverages -Take any medication unless instructed by your physician -Make any legal decisions or sign important papers.  Meals: Start with liquid foods such as gelatin or soup. Progress to regular foods as tolerated. Avoid greasy, spicy, heavy foods. If nausea and/or vomiting occur, drink only clear liquids until the nausea and/or vomiting subsides. Call your physician if vomiting continues.  Special Instructions/Symptoms: Your throat may feel dry or sore from the anesthesia or the breathing tube placed in your throat during surgery. If this causes discomfort, gargle with warm salt water. The discomfort should disappear within 24 hours.  If you had a scopolamine patch placed behind your ear for the management of post- operative nausea and/or vomiting:  1. The medication in the patch is effective for 72 hours, after which it should be removed.  Wrap patch in a tissue and discard in the trash. Wash hands thoroughly with soap and water. 2. You may remove the patch earlier than 72 hours if you experience unpleasant side effects which may include dry mouth, dizziness or visual disturbances. 3. Avoid touching the patch. Wash your hands with soap and water after contact with the patch.

## 2019-12-22 NOTE — Anesthesia Procedure Notes (Signed)
Procedure Name: LMA Insertion Performed by: Shanikia Kernodle M, CRNA Pre-anesthesia Checklist: Patient identified, Emergency Drugs available, Suction available and Patient being monitored Patient Re-evaluated:Patient Re-evaluated prior to induction Oxygen Delivery Method: Circle system utilized Preoxygenation: Pre-oxygenation with 100% oxygen Induction Type: IV induction Ventilation: Mask ventilation without difficulty LMA: LMA inserted LMA Size: 4.0 Tube type: Oral Number of attempts: 1 Airway Equipment and Method: Bite block Placement Confirmation: positive ETCO2 and CO2 detector Tube secured with: Tape Dental Injury: Teeth and Oropharynx as per pre-operative assessment        

## 2019-12-22 NOTE — Op Note (Signed)
RIGHT BREAST LUMPECTOMY X 2 WITH RADIOACTIVE SEED LOCALIZATION  Procedure Note  Jadee Golebiewski 12/22/2019   Pre-op Diagnosis: RIGHT BREAST PAPILLARY LESION     Post-op Diagnosis: same  Procedure(s): RIGHT BREAST LUMPECTOMY X 2 WITH RADIOACTIVE SEED LOCALIZATION  Surgeon(s): Coralie Keens, MD  Anesthesia: General  Staff:  Circulator: McDonough-Hughes, Delene Ruffini, RN Scrub Person: Jackie Plum Circulator Assistant: Izora Ribas, RN  Estimated Blood Loss: Minimal               Specimens: sent to path  Indications: This is a 79 year old female who presented with 2 separate areas of concern in the right breast.  She underwent stereotactic biopsy of both these showing complex papillary lesions.  Lumpectomy to include both these areas was recommended.  In the interim, she had a stroke and is now recovered from that and is ready to proceed with surgery.  Procedure: Patient brought to the operating room identifies correct patient.  She is placed upon the operating table general anesthesia was induced.  Her right breast was prepped and draped in usual sterile fashion.  I located both seeds of the neoprobe.  1 was at the 12 o'clock position just under the edge of the areola and the other one was deeper in the central breast closer to the 6 o'clock position.  I anesthetized skin with Marcaine and made a circumareolar incision with a scalpel.  I then dissected out the breast tissue with electrocautery.  I stayed superior to the 12:00 seed with the aid of neoprobe staying widely around this and then coming underneath.  I then dissected down toward the deeper seed at the 6 o'clock position with the neoprobe as well.  I then completed the lumpectomy with the cautery staying around both seeds in the single specimen.  Once the specimen was removed I marked all margins with paint.  An x-ray was performed on specimen confirming both radioactive seeds and previous marker clips were in the specimen.  It  was then sent to pathology for evaluation.  I achieved hemostasis with the cautery.  As I was dissecting underneath the nipple made a separate incision inadvertently.  I closed both incisions with interrupted 3-0 Vicryl sutures and 4-0 Monocryl sutures.  I then placed Dermabond on the incisions.  The patient was next placed in a breast binder.  She tolerated the procedure well.  All the counts were correct at the end of the procedure.  She was then extubated in the operating room and taken in stable addition to the recovery room.          Coralie Keens   Date: 12/22/2019  Time: 9:34 AM

## 2019-12-22 NOTE — Anesthesia Preprocedure Evaluation (Signed)
Anesthesia Evaluation  Patient identified by MRN, date of birth, ID band Patient awake    Reviewed: Allergy & Precautions, NPO status , Patient's Chart, lab work & pertinent test results  Airway Mallampati: I  TM Distance: >3 FB Neck ROM: Full    Dental no notable dental hx.    Pulmonary    Pulmonary exam normal breath sounds clear to auscultation       Cardiovascular hypertension, Pt. on medications Normal cardiovascular exam Rhythm:Regular Rate:Normal     Neuro/Psych  Headaches, CVA    GI/Hepatic   Endo/Other  diabetes, Type 2  Renal/GU      Musculoskeletal   Abdominal (+) + obese,   Peds  Hematology   Anesthesia Other Findings   Reproductive/Obstetrics                             Anesthesia Physical  Anesthesia Plan  ASA: III  Anesthesia Plan: General   Post-op Pain Management:    Induction: Intravenous  PONV Risk Score and Plan: 3 and Ondansetron, Midazolam, Dexamethasone and Treatment may vary due to age or medical condition  Airway Management Planned: LMA  Additional Equipment:   Intra-op Plan:   Post-operative Plan: Extubation in OR  Informed Consent: I have reviewed the patients History and Physical, chart, labs and discussed the procedure including the risks, benefits and alternatives for the proposed anesthesia with the patient or authorized representative who has indicated his/her understanding and acceptance.       Plan Discussed with: CRNA and Surgeon  Anesthesia Plan Comments:         Anesthesia Quick Evaluation

## 2019-12-22 NOTE — Interval H&P Note (Signed)
History and Physical Interval Note: no change in H and P  12/22/2019 7:53 AM  Jean Watts  has presented today for surgery, with the diagnosis of RIGHT BREAST PAPILLARY LESION.  The various methods of treatment have been discussed with the patient and family. After consideration of risks, benefits and other options for treatment, the patient has consented to  Procedure(s): RIGHT BREAST LUMPECTOMY X 2 WITH RADIOACTIVE SEED LOCALIZATION (Right) as a surgical intervention.  The patient's history has been reviewed, patient examined, no change in status, stable for surgery.  I have reviewed the patient's chart and labs.  Questions were answered to the patient's satisfaction.     Coralie Keens

## 2019-12-22 NOTE — Transfer of Care (Signed)
Immediate Anesthesia Transfer of Care Note  Patient: Jean Watts  Procedure(s) Performed: RIGHT BREAST LUMPECTOMY X 2 WITH RADIOACTIVE SEED LOCALIZATION (Right Breast)  Patient Location: PACU  Anesthesia Type:General  Level of Consciousness: awake, alert  and oriented  Airway & Oxygen Therapy: Patient Spontanous Breathing and Patient connected to face mask oxygen  Post-op Assessment: Report given to RN and Post -op Vital signs reviewed and stable  Post vital signs: Reviewed and stable  Last Vitals:  Vitals Value Taken Time  BP    Temp    Pulse 67 12/22/19 0935  Resp 17 12/22/19 0935  SpO2 100 % 12/22/19 0935  Vitals shown include unvalidated device data.  Last Pain:  Vitals:   12/22/19 0738  TempSrc: Oral  PainSc:          Complications: No complications documented.

## 2019-12-22 NOTE — Progress Notes (Signed)
Carelink Summary Report / Loop Recorder 

## 2019-12-23 ENCOUNTER — Encounter (HOSPITAL_BASED_OUTPATIENT_CLINIC_OR_DEPARTMENT_OTHER): Payer: Self-pay | Admitting: Surgery

## 2019-12-23 LAB — SURGICAL PATHOLOGY

## 2020-01-25 ENCOUNTER — Ambulatory Visit (INDEPENDENT_AMBULATORY_CARE_PROVIDER_SITE_OTHER): Payer: 59 | Admitting: *Deleted

## 2020-01-25 DIAGNOSIS — I6602 Occlusion and stenosis of left middle cerebral artery: Secondary | ICD-10-CM | POA: Diagnosis not present

## 2020-01-26 LAB — CUP PACEART REMOTE DEVICE CHECK
Date Time Interrogation Session: 20210730230353
Implantable Pulse Generator Implant Date: 20210211

## 2020-01-26 NOTE — Progress Notes (Signed)
Carelink Summary Report / Loop Recorder 

## 2020-02-25 ENCOUNTER — Ambulatory Visit (INDEPENDENT_AMBULATORY_CARE_PROVIDER_SITE_OTHER): Payer: 59 | Admitting: *Deleted

## 2020-02-25 DIAGNOSIS — I63512 Cerebral infarction due to unspecified occlusion or stenosis of left middle cerebral artery: Secondary | ICD-10-CM

## 2020-02-25 LAB — CUP PACEART REMOTE DEVICE CHECK
Date Time Interrogation Session: 20210901230645
Implantable Pulse Generator Implant Date: 20210211

## 2020-02-26 NOTE — Progress Notes (Signed)
Carelink Summary Report / Loop Recorder 

## 2020-03-29 ENCOUNTER — Ambulatory Visit (INDEPENDENT_AMBULATORY_CARE_PROVIDER_SITE_OTHER): Payer: 59

## 2020-03-29 DIAGNOSIS — I63512 Cerebral infarction due to unspecified occlusion or stenosis of left middle cerebral artery: Secondary | ICD-10-CM

## 2020-03-29 LAB — CUP PACEART REMOTE DEVICE CHECK
Date Time Interrogation Session: 20211004230151
Implantable Pulse Generator Implant Date: 20210211

## 2020-03-31 NOTE — Progress Notes (Signed)
Carelink Summary Report / Loop Recorder 

## 2020-04-30 ENCOUNTER — Other Ambulatory Visit: Payer: Self-pay

## 2020-04-30 ENCOUNTER — Encounter (HOSPITAL_COMMUNITY): Payer: Self-pay | Admitting: *Deleted

## 2020-04-30 ENCOUNTER — Inpatient Hospital Stay (HOSPITAL_COMMUNITY)
Admission: EM | Admit: 2020-04-30 | Discharge: 2020-05-03 | DRG: 872 | Disposition: A | Discharge status: Home / Self Care | Payer: 59 | Attending: Family Medicine | Admitting: Family Medicine

## 2020-04-30 DIAGNOSIS — I10 Essential (primary) hypertension: Secondary | ICD-10-CM | POA: Diagnosis not present

## 2020-04-30 DIAGNOSIS — A4151 Sepsis due to Escherichia coli [E. coli]: Principal | ICD-10-CM | POA: Diagnosis present

## 2020-04-30 DIAGNOSIS — Z79899 Other long term (current) drug therapy: Secondary | ICD-10-CM | POA: Diagnosis not present

## 2020-04-30 DIAGNOSIS — Z90710 Acquired absence of both cervix and uterus: Secondary | ICD-10-CM

## 2020-04-30 DIAGNOSIS — Z794 Long term (current) use of insulin: Secondary | ICD-10-CM | POA: Diagnosis not present

## 2020-04-30 DIAGNOSIS — N390 Urinary tract infection, site not specified: Secondary | ICD-10-CM | POA: Diagnosis not present

## 2020-04-30 DIAGNOSIS — I5032 Chronic diastolic (congestive) heart failure: Secondary | ICD-10-CM | POA: Diagnosis present

## 2020-04-30 DIAGNOSIS — I63512 Cerebral infarction due to unspecified occlusion or stenosis of left middle cerebral artery: Secondary | ICD-10-CM

## 2020-04-30 DIAGNOSIS — Z7984 Long term (current) use of oral hypoglycemic drugs: Secondary | ICD-10-CM

## 2020-04-30 DIAGNOSIS — N3000 Acute cystitis without hematuria: Secondary | ICD-10-CM | POA: Diagnosis present

## 2020-04-30 DIAGNOSIS — Z7982 Long term (current) use of aspirin: Secondary | ICD-10-CM | POA: Diagnosis not present

## 2020-04-30 DIAGNOSIS — Z20822 Contact with and (suspected) exposure to covid-19: Secondary | ICD-10-CM | POA: Diagnosis present

## 2020-04-30 DIAGNOSIS — E1165 Type 2 diabetes mellitus with hyperglycemia: Secondary | ICD-10-CM | POA: Diagnosis present

## 2020-04-30 DIAGNOSIS — N179 Acute kidney failure, unspecified: Secondary | ICD-10-CM

## 2020-04-30 DIAGNOSIS — E785 Hyperlipidemia, unspecified: Secondary | ICD-10-CM | POA: Diagnosis present

## 2020-04-30 DIAGNOSIS — I69351 Hemiplegia and hemiparesis following cerebral infarction affecting right dominant side: Secondary | ICD-10-CM

## 2020-04-30 DIAGNOSIS — IMO0002 Reserved for concepts with insufficient information to code with codable children: Secondary | ICD-10-CM | POA: Diagnosis present

## 2020-04-30 DIAGNOSIS — R6520 Severe sepsis without septic shock: Secondary | ICD-10-CM | POA: Diagnosis present

## 2020-04-30 DIAGNOSIS — H5461 Unqualified visual loss, right eye, normal vision left eye: Secondary | ICD-10-CM | POA: Diagnosis present

## 2020-04-30 DIAGNOSIS — I110 Hypertensive heart disease with heart failure: Secondary | ICD-10-CM | POA: Diagnosis present

## 2020-04-30 DIAGNOSIS — A419 Sepsis, unspecified organism: Secondary | ICD-10-CM | POA: Diagnosis not present

## 2020-04-30 DIAGNOSIS — Z8249 Family history of ischemic heart disease and other diseases of the circulatory system: Secondary | ICD-10-CM

## 2020-04-30 DIAGNOSIS — H5440 Blindness, one eye, unspecified eye: Secondary | ICD-10-CM | POA: Diagnosis present

## 2020-04-30 DIAGNOSIS — R1900 Intra-abdominal and pelvic swelling, mass and lump, unspecified site: Secondary | ICD-10-CM | POA: Diagnosis present

## 2020-04-30 DIAGNOSIS — E1142 Type 2 diabetes mellitus with diabetic polyneuropathy: Secondary | ICD-10-CM | POA: Diagnosis present

## 2020-04-30 DIAGNOSIS — E860 Dehydration: Secondary | ICD-10-CM | POA: Diagnosis present

## 2020-04-30 DIAGNOSIS — Z8673 Personal history of transient ischemic attack (TIA), and cerebral infarction without residual deficits: Secondary | ICD-10-CM

## 2020-04-30 LAB — HEMOGLOBIN A1C
Hgb A1c MFr Bld: 7 % — ABNORMAL HIGH (ref 4.8–5.6)
Mean Plasma Glucose: 154.2 mg/dL

## 2020-04-30 LAB — URINALYSIS, ROUTINE W REFLEX MICROSCOPIC
Bilirubin Urine: NEGATIVE
Glucose, UA: NEGATIVE mg/dL
Ketones, ur: 5 mg/dL — AB
Nitrite: NEGATIVE
Protein, ur: 100 mg/dL — AB
Specific Gravity, Urine: 1.014 (ref 1.005–1.030)
WBC, UA: >50 WBC/hpf — ABNORMAL HIGH (ref 0–5)
pH: 6 (ref 5.0–8.0)

## 2020-04-30 LAB — CBC WITH DIFFERENTIAL/PLATELET
Abs Immature Granulocytes: 0.04 10*3/uL (ref 0.00–0.07)
Basophils Absolute: 0 10*3/uL (ref 0.0–0.1)
Basophils Relative: 0 %
Eosinophils Absolute: 0 10*3/uL (ref 0.0–0.5)
Eosinophils Relative: 0 %
HCT: 32.8 % — ABNORMAL LOW (ref 36.0–46.0)
Hemoglobin: 10.7 g/dL — ABNORMAL LOW (ref 12.0–15.0)
Immature Granulocytes: 0 %
Lymphocytes Relative: 5 %
Lymphs Abs: 0.6 10*3/uL — ABNORMAL LOW (ref 0.7–4.0)
MCH: 27.2 pg (ref 26.0–34.0)
MCHC: 32.6 g/dL (ref 30.0–36.0)
MCV: 83.5 fL (ref 80.0–100.0)
Monocytes Absolute: 0.3 10*3/uL (ref 0.1–1.0)
Monocytes Relative: 3 %
Neutro Abs: 11.1 10*3/uL — ABNORMAL HIGH (ref 1.7–7.7)
Neutrophils Relative %: 92 %
Platelets: 225 10*3/uL (ref 150–400)
RBC: 3.93 MIL/uL (ref 3.87–5.11)
RDW: 15.9 % — ABNORMAL HIGH (ref 11.5–15.5)
WBC: 12.1 10*3/uL — ABNORMAL HIGH (ref 4.0–10.5)
nRBC: 0 % (ref 0.0–0.2)

## 2020-04-30 LAB — COMPREHENSIVE METABOLIC PANEL
ALT: 14 U/L (ref 0–44)
AST: 19 U/L (ref 15–41)
Albumin: 3.5 g/dL (ref 3.5–5.0)
Alkaline Phosphatase: 110 U/L (ref 38–126)
Anion gap: 12 (ref 5–15)
BUN: 23 mg/dL (ref 8–23)
CO2: 20 mmol/L — ABNORMAL LOW (ref 22–32)
Calcium: 8.8 mg/dL — ABNORMAL LOW (ref 8.9–10.3)
Chloride: 101 mmol/L (ref 98–111)
Creatinine, Ser: 1.82 mg/dL — ABNORMAL HIGH (ref 0.44–1.00)
GFR, Estimated: 28 mL/min — ABNORMAL LOW (ref >60)
Glucose, Bld: 289 mg/dL — ABNORMAL HIGH (ref 70–99)
Potassium: 3.7 mmol/L (ref 3.5–5.1)
Sodium: 133 mmol/L — ABNORMAL LOW (ref 135–145)
Total Bilirubin: 1.5 mg/dL — ABNORMAL HIGH (ref 0.3–1.2)
Total Protein: 7.2 g/dL (ref 6.5–8.1)

## 2020-04-30 LAB — CBG MONITORING, ED: Glucose-Capillary: 226 mg/dL — ABNORMAL HIGH (ref 70–99)

## 2020-04-30 LAB — RESPIRATORY PANEL BY RT PCR (FLU A&B, COVID)
Influenza A by PCR: NEGATIVE
Influenza B by PCR: NEGATIVE
SARS Coronavirus 2 by RT PCR: NEGATIVE

## 2020-04-30 LAB — GLUCOSE, CAPILLARY: Glucose-Capillary: 246 mg/dL — ABNORMAL HIGH (ref 70–99)

## 2020-04-30 MED ORDER — ENOXAPARIN SODIUM 30 MG/0.3ML ~~LOC~~ SOLN
30.0000 mg | SUBCUTANEOUS | Status: DC
  Administered 2020-04-30 – 2020-05-02 (×3): 30 mg via SUBCUTANEOUS
  Filled 2020-04-30 (×4): qty 0.3

## 2020-04-30 MED ORDER — SODIUM CHLORIDE 0.9 % IV BOLUS
1000.0000 mL | Freq: Once | INTRAVENOUS | Status: AC
  Administered 2020-04-30: 1000 mL via INTRAVENOUS

## 2020-04-30 MED ORDER — INSULIN NPH (HUMAN) (ISOPHANE) 100 UNIT/ML ~~LOC~~ SUSP
20.0000 [IU] | Freq: Every day | SUBCUTANEOUS | Status: DC
  Administered 2020-04-30 – 2020-05-02 (×3): 20 [IU] via SUBCUTANEOUS
  Filled 2020-04-30 (×2): qty 10

## 2020-04-30 MED ORDER — SODIUM CHLORIDE 0.9 % IV SOLN
1.0000 g | INTRAVENOUS | Status: DC
  Administered 2020-05-01 – 2020-05-03 (×3): 1 g via INTRAVENOUS
  Filled 2020-04-30 (×3): qty 10

## 2020-04-30 MED ORDER — INSULIN ASPART 100 UNIT/ML ~~LOC~~ SOLN
0.0000 [IU] | Freq: Every day | SUBCUTANEOUS | Status: DC
  Administered 2020-04-30 – 2020-05-01 (×2): 2 [IU] via SUBCUTANEOUS

## 2020-04-30 MED ORDER — ATORVASTATIN CALCIUM 40 MG PO TABS
40.0000 mg | ORAL_TABLET | Freq: Every day | ORAL | Status: DC
  Administered 2020-04-30 – 2020-05-03 (×4): 40 mg via ORAL
  Filled 2020-04-30 (×4): qty 1

## 2020-04-30 MED ORDER — ASPIRIN 325 MG PO TABS
325.0000 mg | ORAL_TABLET | Freq: Every day | ORAL | Status: DC
  Administered 2020-04-30 – 2020-05-03 (×4): 325 mg via ORAL
  Filled 2020-04-30 (×4): qty 1

## 2020-04-30 MED ORDER — ONDANSETRON HCL 4 MG/2ML IJ SOLN: 4.0000 mg | Freq: Four times a day (QID) | INTRAMUSCULAR | Status: DC | PRN

## 2020-04-30 MED ORDER — SODIUM CHLORIDE 0.9 % IV SOLN
INTRAVENOUS | Status: DC
  Administered 2020-05-01: 100 mL/h via INTRAVENOUS

## 2020-04-30 MED ORDER — AMITRIPTYLINE HCL 25 MG PO TABS
50.0000 mg | ORAL_TABLET | Freq: Every day | ORAL | Status: DC
  Administered 2020-04-30 – 2020-05-02 (×3): 50 mg via ORAL
  Filled 2020-04-30: qty 1
  Filled 2020-04-30 (×2): qty 2
  Filled 2020-04-30: qty 1
  Filled 2020-04-30 (×2): qty 2

## 2020-04-30 MED ORDER — INSULIN ASPART 100 UNIT/ML ~~LOC~~ SOLN
0.0000 [IU] | Freq: Three times a day (TID) | SUBCUTANEOUS | Status: DC
  Administered 2020-04-30: 5 [IU] via SUBCUTANEOUS
  Administered 2020-05-01 (×3): 3 [IU] via SUBCUTANEOUS
  Administered 2020-05-02: 2 [IU] via SUBCUTANEOUS
  Administered 2020-05-03: 5 [IU] via SUBCUTANEOUS
  Administered 2020-05-03: 3 [IU] via SUBCUTANEOUS
  Filled 2020-04-30: qty 1

## 2020-04-30 MED ORDER — SODIUM CHLORIDE 0.9 % IV SOLN
1.0000 g | Freq: Once | INTRAVENOUS | Status: AC
  Administered 2020-04-30: 1 g via INTRAVENOUS
  Filled 2020-04-30: qty 10

## 2020-04-30 MED ORDER — MECLIZINE HCL 12.5 MG PO TABS
25.0000 mg | ORAL_TABLET | Freq: Every day | ORAL | Status: DC
  Administered 2020-04-30 – 2020-05-02 (×3): 25 mg via ORAL
  Filled 2020-04-30 (×4): qty 2

## 2020-04-30 MED ORDER — CYCLOSPORINE 0.05 % OP EMUL
1.0000 [drp] | Freq: Two times a day (BID) | OPHTHALMIC | Status: DC
  Administered 2020-04-30 – 2020-05-03 (×6): 1 [drp] via OPHTHALMIC
  Filled 2020-04-30 (×11): qty 1

## 2020-04-30 MED ORDER — GABAPENTIN 400 MG PO CAPS: 400.0000 mg | ORAL_CAPSULE | Freq: Three times a day (TID) | ORAL | Status: DC

## 2020-04-30 MED ORDER — ACETAMINOPHEN 325 MG PO TABS
650.0000 mg | ORAL_TABLET | ORAL | Status: DC | PRN
  Administered 2020-05-02: 650 mg via ORAL
  Filled 2020-04-30: qty 2

## 2020-04-30 MED ORDER — METOPROLOL TARTRATE 25 MG PO TABS
25.0000 mg | ORAL_TABLET | Freq: Two times a day (BID) | ORAL | Status: DC
  Administered 2020-04-30 – 2020-05-03 (×6): 25 mg via ORAL
  Filled 2020-04-30 (×7): qty 1

## 2020-04-30 MED ORDER — ONDANSETRON HCL 4 MG PO TABS: 4.0000 mg | ORAL_TABLET | Freq: Four times a day (QID) | ORAL | Status: DC | PRN

## 2020-04-30 MED ORDER — GABAPENTIN 300 MG PO CAPS
300.0000 mg | ORAL_CAPSULE | Freq: Every day | ORAL | Status: DC
  Administered 2020-04-30 – 2020-05-02 (×3): 300 mg via ORAL
  Filled 2020-04-30 (×4): qty 1

## 2020-04-30 MED ORDER — INSULIN NPH (HUMAN) (ISOPHANE) 100 UNIT/ML ~~LOC~~ SUSP
40.0000 [IU] | Freq: Every day | SUBCUTANEOUS | Status: DC
  Administered 2020-05-01 – 2020-05-02 (×2): 40 [IU] via SUBCUTANEOUS
  Filled 2020-04-30: qty 10

## 2020-04-30 MED ORDER — CEFTRIAXONE SODIUM 1 G IJ SOLR: 1.0000 g | Freq: Once | INTRAMUSCULAR | Status: DC

## 2020-04-30 MED ORDER — ALBUTEROL SULFATE (2.5 MG/3ML) 0.083% IN NEBU: 2.5000 mg | INHALATION_SOLUTION | Freq: Four times a day (QID) | RESPIRATORY_TRACT | Status: DC | PRN

## 2020-04-30 NOTE — ED Triage Notes (Signed)
Urinary frequency for 2 days

## 2020-04-30 NOTE — ED Notes (Signed)
Pt informed of need for urine sample, but unable to give one at this time. Cup of water given to pt per her request.

## 2020-04-30 NOTE — H&P (Signed)
History and Physical  Jean Watts HEN:[id number] DOB: 09/02/1940 DOA: 04/30/2020  Referring physician: Dr Dewayne Hatch, ED physician PCP: Rosita Fire, MD  Outpatient Specialists:   Patient Coming From: home  Chief Complaint: fevers, chills  HPI: Jean Watts is a 79 y.o. female with a history of stroke, type 2 diabetes, hypertension, hyperlipidemia.  Patient came to the hospital due to fevers and chills that started yesterday.  Over the past 2 days, the patient has experienced frequency, urgency, fevers and shaking chills.  No abdominal pain.  Has diminished appetite.  No diarrhea, constipation, vomiting, chest pain, shortness of breath.  No palliating or provoking factors.  Emergency Department Course: UA significant for UTI.  Creatinine 1.8 with baseline less than 1.  White count 12.  Urine culture sent, Rocephin started, IV fluid bolus.  Review of Systems:   Pt denies any nausea, vomiting, diarrhea, constipation, abdominal pain, shortness of breath, dyspnea on exertion, orthopnea, cough, wheezing, palpitations, headache, vision changes, lightheadedness, dizziness, melena, rectal bleeding.  Review of systems are otherwise negative  Past Medical History:  Diagnosis Date  . Dyspnea   . HLD (hyperlipidemia)   . HTN (hypertension)   . Neuropathy   . Stroke [facility])    no residual   . Type 2 diabetes mellitus with diabetic polyneuropathy ([place])   . Visual loss    right eye   Past Surgical History:  Procedure Laterality Date  . ABDOMINAL HYSTERECTOMY    . BREAST BIOPSY  02/2019  . BREAST LUMPECTOMY WITH RADIOACTIVE SEED LOCALIZATION Right 12/22/2019   Procedure: RIGHT BREAST LUMPECTOMY X 2 WITH RADIOACTIVE SEED LOCALIZATION;  Surgeon: Coralie Keens, MD;  Location: [place];  Service: General;  Laterality: Right;  . FLEXIBLE SIGMOIDOSCOPY N/A 06/04/2019   Procedure: FLEXIBLE SIGMOIDOSCOPY;  Surgeon: Daneil Dolin, MD;  [facility];  Service:  Endoscopy;  Laterality: N/A;  incomplete colonoscopy, poor prep  . IR CT HEAD LTD  08/04/2019  . IR PERCUTANEOUS ART THROMBECTOMY/INFUSION INTRACRANIAL INC DIAG ANGIO  08/04/2019  . LOOP RECORDER INSERTION N/A 08/06/2019   Procedure: LOOP RECORDER INSERTION;  Surgeon: Thompson Grayer, MD;  Location: [place] [facility];  Service: Cardiovascular;  Laterality: N/A;  . RADIOLOGY WITH ANESTHESIA N/A 08/04/2019   Procedure: IR WITH ANESTHESIA;  Surgeon: Radiologist, Medication, MD;  Location: [place];  Service: Radiology;  Laterality: N/A;   Social History:  reports that she has never smoked. She has never used smokeless tobacco. She reports that she does not drink alcohol and does not use drugs. Patient lives at home  No Known Allergies  Family History  Problem Relation Age of Onset  . Cancer Mother   . Heart attack Father   . Prostate cancer Brother   . Lung cancer Brother   . Colon cancer Neg Hx   . Colon polyps Neg Hx       Prior to Admission medications   Medication Sig Start Date End Date Taking? Authorizing Provider  albuterol (PROVENTIL) (2.5 MG/3ML) 0.083% nebulizer solution Inhale 2.5 mg into the lungs every 6 (six) hours as needed for wheezing or shortness of breath.  01/29/19  Yes [provider]  amitriptyline (ELAVIL) 50 MG tablet Take 50 mg by mouth at bedtime.  02/17/19  Yes [provider]  amLODipine-benazepril (LOTREL) 5-10 MG capsule Take 1 capsule by mouth daily. 04/14/20  Yes [provider]  aspirin 325 MG tablet Take 1 tablet (325 mg total) by mouth daily. 08/07/19  Yes Donzetta Starch,  NP  atorvastatin (LIPITOR) 40 MG tablet Take 1 tablet (40 mg total) by mouth daily at 6 PM. 08/06/19  Yes Biby, Massie Kluver, NP  furosemide (LASIX) 20 MG tablet Take 20 mg by mouth as needed. 03/29/20  Yes [provider]  gabapentin (NEURONTIN) 300 MG capsule Take 300 mg by mouth at bedtime. 03/09/20  Yes [provider]  HUMULIN N 100 UNIT/ML injection Inject  20-40 Units into the skin See admin instructions. Inject 40 units into the skin in the morning and 20 units in the evening 02/23/19  Yes [provider]  meclizine (ANTIVERT) 25 MG tablet Take 25 mg by mouth at bedtime.  01/05/19  Yes [provider]  metFORMIN (GLUCOPHAGE) 1000 MG tablet Take 1,000 mg by mouth 2 (two) times daily.  03/09/19  Yes [provider]  metoprolol tartrate (LOPRESSOR) 25 MG tablet Take 25 mg by mouth 2 (two) times daily.  03/09/19  Yes [provider]  RESTASIS 0.05 % ophthalmic emulsion 1 drop 2 (two) times daily. 04/06/20  Yes [provider]  traMADol (ULTRAM) 50 MG tablet Take 1 tablet (50 mg total) by mouth every 6 (six) hours as needed for moderate pain. 12/22/19  Yes Coralie Keens, MD  gabapentin (NEURONTIN) 400 MG capsule Take 400 mg by mouth 3 (three) times daily.  Patient not taking: Reported on 04/30/2020 03/09/19   [provider]    Physical Exam: BP (!) 100/43 (BP Location: Right Arm)   Pulse (!) 102   Temp 98.2 F (36.8 C)   Resp 16   SpO2 95%   . General: Elderly female. Awake and alert and oriented x3. No acute cardiopulmonary distress.  Marland Kitchen HEENT: Normocephalic atraumatic.  Right and left ears normal in appearance.  Pupils equal, round, reactive to light. Extraocular muscles are intact. Sclerae anicteric and noninjected.  Moist mucosal membranes. No mucosal lesions.  . Neck: Neck supple without lymphadenopathy. No carotid bruits. No masses palpated.  . Cardiovascular: Regular rate with normal S1-S2 sounds. No murmurs, rubs, gallops auscultated. No JVD.  Marland Kitchen Respiratory: Good respiratory effort with no wheezes, rales, rhonchi. Lungs clear to auscultation bilaterally.  No accessory muscle use. . Abdomen: Soft, nontender, nondistended. Active bowel sounds. No masses or hepatosplenomegaly  . Skin: No rashes, lesions, or ulcerations.  Dry, warm to touch. 2+ dorsalis pedis and radial  pulses. . Musculoskeletal: No calf or leg pain. All major joints not erythematous nontender.  No upper or lower joint deformation.  Good ROM.  No contractures  . Psychiatric: Intact judgment and insight. Pleasant and cooperative. . Neurologic: No focal neurological deficits. Strength is 5/5 and symmetric in upper and lower extremities.  Cranial nerves II through XII are grossly intact.           Labs on Admission: I have personally reviewed following labs and imaging studies  CBC: Recent Labs  Lab 04/30/20 1507  WBC 12.1*  NEUTROABS 11.1*  HGB 10.7*  HCT 32.8*  MCV 83.5  PLT 956   Basic Metabolic Panel: Recent Labs  Lab 04/30/20 1507  NA 133*  K 3.7  CL 101  CO2 20*  GLUCOSE 289*  BUN 23  CREATININE 1.82*  CALCIUM 8.8*   GFR: CrCl cannot be calculated (Unknown ideal weight.). Liver Function Tests: Recent Labs  Lab 04/30/20 1507  AST 19  ALT 14  ALKPHOS 110  BILITOT 1.5*  PROT 7.2  ALBUMIN 3.5   No results for input(s): LIPASE, AMYLASE in the last 168 hours.  No results for input(s): AMMONIA in the last 168 hours. Coagulation Profile: No results for input(s): INR, PROTIME in the last 168 hours. Cardiac Enzymes: No results for input(s): CKTOTAL, CKMB, CKMBINDEX, TROPONINI in the last 168 hours. BNP (last 3 results) No results for input(s): PROBNP in the last 8760 hours. HbA1C: No results for input(s): HGBA1C in the last 72 hours. CBG: Recent Labs  Lab 04/30/20 1501  GLUCAP 226*   Lipid Profile: No results for input(s): CHOL, HDL, LDLCALC, TRIG, CHOLHDL, LDLDIRECT in the last 72 hours. Thyroid Function Tests: No results for input(s): TSH, T4TOTAL, FREET4, T3FREE, THYROIDAB in the last 72 hours. Anemia Panel: No results for input(s): VITAMINB12, FOLATE, FERRITIN, TIBC, IRON, RETICCTPCT in the last 72 hours. Urine analysis:    Component Value Date/Time   COLORURINE AMBER (A) 04/30/2020 1215   APPEARANCEUR CLOUDY (A) 04/30/2020 1215   LABSPEC 1.014  04/30/2020 1215   PHURINE 6.0 04/30/2020 1215   GLUCOSEU NEGATIVE 04/30/2020 1215   HGBUR MODERATE (A) 04/30/2020 1215   BILIRUBINUR NEGATIVE 04/30/2020 1215   KETONESUR 5 (A) 04/30/2020 1215   PROTEINUR 100 (A) 04/30/2020 1215   NITRITE NEGATIVE 04/30/2020 1215   LEUKOCYTESUR MODERATE (A) 04/30/2020 1215   Sepsis Labs: @LABRCNTIP (procalcitonin:4,lacticidven:4) )No results found for this or any previous visit (from the past 240 hour(s)).   Radiological Exams on Admission: No results found.   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  We also discussed care given by the ED provider.  1. AKI a. Admit b. IV fluid bolus given c. Continue IV fluids d. Check creatinine in the morning e. Hold Metformin f. Hold benazepril 2. UTI a. Urine culture sent b. Continue Rocephin c. CBC in the morning d. Ultrasound in the morning due to elevated creatinine 3. Diabetes a. Continue insulin b. Sliding scale insulin with CBGs c. Hold metformin 4. Hypertension a. Holding benazepril and amlodipine b. Continue metoprolol 5. History of stroke a. Continue daily aspirin  DVT prophylaxis: Lovenox 30mg  Consultants: None Code Status: Full code Family Communication: None Disposition Plan: Patient should be able to return home following resolution   Truett Mainland, DO

## 2020-04-30 NOTE — ED Notes (Signed)
Pt eating voraciously  When asked if she lives alone, she replies that she lives w her daughter

## 2020-04-30 NOTE — ED Notes (Signed)
Report to Gilmore [place], [place]

## 2020-04-30 NOTE — ED Provider Notes (Signed)
Emergency Department Provider Note   I have reviewed the triage vital signs and the nursing notes.   HISTORY  Chief Complaint Urinary Frequency   HPI Jean Watts is a 79 y.o. female with past medical history reviewed below presents emergency department for evaluation of urine frequency over the past 2 days.  She denies dysuria but has had some hesitancy symptoms.  She denies any abdominal or back pain.  No fevers but did have some chills last night.  No radiation of symptoms or other modifying factors.  No vomiting or diarrhea.  Denies any chest pain or shortness of breath symptoms.  She is having some chronic tightness in her neck but no new or suddenly worsening symptoms. No recent abx.    Past Medical History:  Diagnosis Date  . Dyspnea   . HLD (hyperlipidemia)   . HTN (hypertension)   . Neuropathy   . Stroke [facility])    no residual   . Type 2 diabetes mellitus with diabetic polyneuropathy ([place])   . Visual loss    right eye    Patient Active Problem List   Diagnosis Date Noted  . AKI (acute kidney injury) ([place]) 04/30/2020  . Acute lower UTI 04/30/2020  . Essential hypertension 08/06/2019  . Hyperlipidemia LDL goal <70 08/06/2019  . Diabetes mellitus type II, uncontrolled ([place]) 08/06/2019  . Diabetic polyneuropathy ([place]) 08/06/2019  . Obesity 08/06/2019  . Migraine 08/06/2019  . CRAO (central retinal artery occlusion), right - 2012 08/06/2019  . 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 Status post stroke 08/04/2019  . Acute ischemic left MCA stroke ([facility]) s/p IR, embolic d/t unknown source 08/04/2019  . Middle cerebral artery embolism, left 08/04/2019  . Colon cancer screening 03/16/2019    Past Surgical History:  Procedure Laterality Date  . ABDOMINAL HYSTERECTOMY    . BREAST BIOPSY  02/2019  . BREAST LUMPECTOMY WITH RADIOACTIVE SEED LOCALIZATION Right 12/22/2019   Procedure: RIGHT BREAST LUMPECTOMY X 2 WITH RADIOACTIVE SEED LOCALIZATION;  Surgeon: Coralie Keens,  MD;  Location: [place];  Service: General;  Laterality: Right;  . FLEXIBLE SIGMOIDOSCOPY N/A 06/04/2019   Procedure: FLEXIBLE SIGMOIDOSCOPY;  Surgeon: Daneil Dolin, MD;  [facility];  Service: Endoscopy;  Laterality: N/A;  incomplete colonoscopy, poor prep  . IR CT HEAD LTD  08/04/2019  . IR PERCUTANEOUS ART THROMBECTOMY/INFUSION INTRACRANIAL INC DIAG ANGIO  08/04/2019  . LOOP RECORDER INSERTION N/A 08/06/2019   Procedure: LOOP RECORDER INSERTION;  Surgeon: Thompson Grayer, MD;  Location: [place] [facility];  Service: Cardiovascular;  Laterality: N/A;  . RADIOLOGY WITH ANESTHESIA N/A 08/04/2019   Procedure: IR WITH ANESTHESIA;  Surgeon: Radiologist, Medication, MD;  Location: [place];  Service: Radiology;  Laterality: N/A;    Allergies Patient has no known allergies.  Family History  Problem Relation Age of Onset  . Cancer Mother   . Heart attack Father   . Prostate cancer Brother   . Lung cancer Brother   . Colon cancer Neg Hx   . Colon polyps Neg Hx     Social History Social History   Tobacco Use  . Smoking status: Never Smoker  . Smokeless tobacco: Never Used  Vaping Use  . Vaping Use: Never used  Substance Use Topics  . Alcohol use: Never  . Drug use: Never    Review of Systems  Constitutional: No fever/chills Eyes: No visual changes. ENT: No sore throat. Cardiovascular: Denies chest pain. Respiratory: Denies shortness of breath. Gastrointestinal: No  abdominal pain.  No nausea, no vomiting.  No diarrhea.  No constipation. Genitourinary: Negative for dysuria. Positive hesitancy and frequency.  Musculoskeletal: Negative for back pain. Skin: Negative for rash. Neurological: Negative for headaches, focal weakness or numbness.  10-point ROS otherwise negative.  ____________________________________________   PHYSICAL EXAM:  VITAL SIGNS: ED Triage Vitals  Enc Vitals Group     BP 04/30/20 1206 124/87     Pulse Rate 04/30/20 1206 (!) 112      Resp 04/30/20 1206 20     Temp 04/30/20 1206 98.2 F (36.8 C)     Temp src --      SpO2 04/30/20 1206 95 %   Constitutional: Alert and oriented. Well appearing and in no acute distress. Eyes: Conjunctivae are normal.  Head: Atraumatic. Nose: No congestion/rhinnorhea. Mouth/Throat: Mucous membranes are moist. Neck: No stridor.   Cardiovascular: Normal rate, regular rhythm. Good peripheral circulation. Grossly normal heart sounds.   Respiratory: Normal respiratory effort.  No retractions. Lungs CTAB. Gastrointestinal: Soft and nontender. No distention. No CVA tenderness.  Musculoskeletal: No gross deformities of extremities. Neurologic:  Normal speech and language. Skin:  Skin is warm, dry and intact. No rash noted.   ____________________________________________   LABS (all labs ordered are listed, but only abnormal results are displayed)  Labs Reviewed  URINALYSIS, ROUTINE W REFLEX MICROSCOPIC - Abnormal; Notable for the following components:      Result Value   Color, Urine AMBER (*)    APPearance CLOUDY (*)    Hgb urine dipstick MODERATE (*)    Ketones, ur 5 (*)    Protein, ur 100 (*)    Leukocytes,Ua MODERATE (*)    WBC, UA >50 (*)    Bacteria, UA MANY (*)    All other components within normal limits  COMPREHENSIVE METABOLIC PANEL - Abnormal; Notable for the following components:   Sodium 133 (*)    CO2 20 (*)    Glucose, Bld 289 (*)    Creatinine, Ser 1.82 (*)    Calcium 8.8 (*)    Total Bilirubin 1.5 (*)    GFR, Estimated 28 (*)    All other components within normal limits  CBC WITH DIFFERENTIAL/PLATELET - Abnormal; Notable for the following components:   WBC 12.1 (*)    Hemoglobin 10.7 (*)    HCT 32.8 (*)    RDW 15.9 (*)    Neutro Abs 11.1 (*)    Lymphs Abs 0.6 (*)    All other components within normal limits  HEMOGLOBIN A1C - Abnormal; Notable for the following components:   Hgb A1c MFr Bld 7.0 (*)    All other components within normal limits   GLUCOSE, CAPILLARY - Abnormal; Notable for the following components:   Glucose-Capillary 246 (*)    All other components within normal limits  GLUCOSE, CAPILLARY - Abnormal; Notable for the following components:   Glucose-Capillary 234 (*)    All other components within normal limits  CBG MONITORING, ED - Abnormal; Notable for the following components:   Glucose-Capillary 226 (*)    All other components within normal limits  RESPIRATORY PANEL BY RT PCR (FLU A&B, COVID)  URINE CULTURE  BASIC METABOLIC PANEL  CBC    ____________________________________________  RADIOLOGY  None  ____________________________________________   PROCEDURES  Procedure(s) performed:   Procedures  None  ____________________________________________   INITIAL IMPRESSION / ASSESSMENT AND PLAN / ED COURSE  Pertinent labs & imaging results that were available during my care of the patient were reviewed by  me and considered in my medical decision making (see chart for details).   Patient presents to the ED for evaluation of urinary frequency with some chills last night.  Vitals on arrival showed mild tachycardia but afebrile with normal blood pressure.  Patient is overall well-appearing with no abdominal tenderness or CVA tenderness.  My suspicion for developing pyelonephritis or sepsis is very low.  Plan for UA and reassess.   Patient with persistent tachycardia here. Plan for screening labs and IVF. UA pending. Care transferred to Dr. Roderic Palau.  ____________________________________________  FINAL CLINICAL IMPRESSION(S) / ED DIAGNOSES  Final diagnoses:  Acute cystitis without hematuria  UTI (urinary tract infection)  AKI (acute kidney injury) ([place])     MEDICATIONS GIVEN DURING THIS VISIT:  Medications  aspirin tablet 325 mg (325 mg Oral Given 04/30/20 1817)  atorvastatin (LIPITOR) tablet 40 mg (40 mg Oral Given 04/30/20 1814)  metoprolol tartrate (LOPRESSOR) tablet 25 mg (25 mg Oral Given  04/30/20 2114)  amitriptyline (ELAVIL) tablet 50 mg (50 mg Oral Given 04/30/20 2114)  insulin NPH Human (NOVOLIN N) injection 40 Units (has no administration in time range)  meclizine (ANTIVERT) tablet 25 mg (25 mg Oral Given 04/30/20 2113)  albuterol (PROVENTIL) (2.5 MG/3ML) 0.083% nebulizer solution 2.5 mg (has no administration in time range)  insulin aspart (novoLOG) injection 0-15 Units (5 Units Subcutaneous Given 04/30/20 1811)  insulin aspart (novoLOG) injection 0-5 Units (2 Units Subcutaneous Given 04/30/20 2120)  enoxaparin (LOVENOX) injection 30 mg (30 mg Subcutaneous Given 04/30/20 1814)  cefTRIAXone (ROCEPHIN) 1 g in sodium chloride 0.9 % 100 mL IVPB (1 g Intravenous Not Given 04/30/20 1755)  0.9 %  sodium chloride infusion ( Intravenous New Bag/Given 05/01/20 0339)  ondansetron (ZOFRAN) tablet 4 mg (has no administration in time range)    Or  ondansetron (ZOFRAN) injection 4 mg (has no administration in time range)  gabapentin (NEURONTIN) capsule 300 mg (300 mg Oral Given 04/30/20 2113)  cycloSPORINE (RESTASIS) 0.05 % ophthalmic emulsion 1 drop (1 drop Both Eyes Given 04/30/20 2114)  insulin NPH Human (NOVOLIN N) injection 20 Units (20 Units Subcutaneous Given 04/30/20 2355)  acetaminophen (TYLENOL) tablet 650 mg (has no administration in time range)  sodium chloride 0.9 % bolus 1,000 mL (0 mLs Intravenous Stopped 04/30/20 1819)  cefTRIAXone (ROCEPHIN) 1 g in sodium chloride 0.9 % 100 mL IVPB ( Intravenous Stopped 04/30/20 1713)     Note:  This document was prepared using Dragon voice recognition software and may include unintentional dictation errors.  Nanda Quinton, MD, [facility] Emergency Medicine    Dewayne Jurek, [place], MD 05/01/20 [phone]

## 2020-04-30 NOTE — ED Notes (Signed)
Report received 

## 2020-05-01 ENCOUNTER — Inpatient Hospital Stay (HOSPITAL_COMMUNITY): Payer: 59

## 2020-05-01 DIAGNOSIS — R1900 Intra-abdominal and pelvic swelling, mass and lump, unspecified site: Secondary | ICD-10-CM | POA: Diagnosis present

## 2020-05-01 DIAGNOSIS — R6520 Severe sepsis without septic shock: Secondary | ICD-10-CM

## 2020-05-01 DIAGNOSIS — A419 Sepsis, unspecified organism: Secondary | ICD-10-CM | POA: Diagnosis present

## 2020-05-01 LAB — BASIC METABOLIC PANEL
Anion gap: 8 (ref 5–15)
BUN: 26 mg/dL — ABNORMAL HIGH (ref 8–23)
CO2: 22 mmol/L (ref 22–32)
Calcium: 8.6 mg/dL — ABNORMAL LOW (ref 8.9–10.3)
Chloride: 106 mmol/L (ref 98–111)
Creatinine, Ser: 1.37 mg/dL — ABNORMAL HIGH (ref 0.44–1.00)
GFR, Estimated: 40 mL/min — ABNORMAL LOW (ref >60)
Glucose, Bld: 181 mg/dL — ABNORMAL HIGH (ref 70–99)
Potassium: 3.2 mmol/L — ABNORMAL LOW (ref 3.5–5.1)
Sodium: 136 mmol/L (ref 135–145)

## 2020-05-01 LAB — CUP PACEART REMOTE DEVICE CHECK
Date Time Interrogation Session: 20211106230132
Implantable Pulse Generator Implant Date: 20210211

## 2020-05-01 LAB — CBC
HCT: 28.4 % — ABNORMAL LOW (ref 36.0–46.0)
Hemoglobin: 9.3 g/dL — ABNORMAL LOW (ref 12.0–15.0)
MCH: 26.8 pg (ref 26.0–34.0)
MCHC: 32.7 g/dL (ref 30.0–36.0)
MCV: 81.8 fL (ref 80.0–100.0)
Platelets: 208 10*3/uL (ref 150–400)
RBC: 3.47 MIL/uL — ABNORMAL LOW (ref 3.87–5.11)
RDW: 15.8 % — ABNORMAL HIGH (ref 11.5–15.5)
WBC: 11.2 10*3/uL — ABNORMAL HIGH (ref 4.0–10.5)
nRBC: 0 % (ref 0.0–0.2)

## 2020-05-01 LAB — GLUCOSE, CAPILLARY
Glucose-Capillary: 234 mg/dL — ABNORMAL HIGH (ref 70–99)
Glucose-Capillary: 171 mg/dL — ABNORMAL HIGH (ref 70–99)
Glucose-Capillary: 199 mg/dL — ABNORMAL HIGH (ref 70–99)
Glucose-Capillary: 221 mg/dL — ABNORMAL HIGH (ref 70–99)
Glucose-Capillary: 181 mg/dL — ABNORMAL HIGH (ref 70–99)
Glucose-Capillary: 229 mg/dL — ABNORMAL HIGH (ref 70–99)

## 2020-05-01 NOTE — Progress Notes (Addendum)
Patient Demographics:    Jean Watts, is a 79 y.o. female, DOB - [birth year], MAU:[id number]  Admit date - 04/30/2020   Admitting Physician Truett Mainland, DO  Outpatient Primary MD for the patient is Rosita Fire, MD  LOS - 1   Chief Complaint  Patient presents with  . 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 Ovarian Origin   Essential hypertension   Diabetes mellitus type II, uncontrolled (HCC)   Status post stroke--residual right-sided hemiparesis   Hyperlipidemia LDL goal <70   Blind right eye  Brief Summary:- 79 year old female with past medical history relevant for DM2, HTN, HLD and prior history of CVA with residual right-sided hemiparesis admitted on 04/30/2020 with concerns for sepsis secondary to UTI and AKI, with abdominal ultrasound suggestive of possible pelvic/ovarian mass  A/p 1)AKI----acute kidney injury due to UTI and dehydration, compounded by Lasix, and benazepril use-    creatinine on admission= 1.82 , baseline creatinine = 0.8   -- renally adjust medications, avoid nephrotoxic agents / dehydration  / hypotension -Hold benazepril, hold Lasix and hold Metformin -Gentle hydration with IV fluids -Renal ultrasound without significant obstructive uropathy  2)Severe sepsis secondary to UTI--- on admission patient met sepsis criteria with fevers at home, tachycardia, leukocytosis and UTI with AKI -Continue IV Rocephin and IV fluids pending urine culture results -Blood cultures requested  3)Large  septated mass in the pelvis measuring up to 13.7 cm, possibly ovarian in origin--- abdominal ultrasound with pelvic mass as noted above, dedicated pelvic ultrasound requested  4)DM2-7.0 reflecting fair diabetic control, -Hold Metformin due to AKI -Continue NPH  insulin regimen and Use Novolog/Humalog Sliding scale insulin with Accu-Cheks/Fingersticks as ordered   5) history of prior stroke with residual right-sided hemiparesis--blood, continue aspirin and Lipitor for secondary stroke prophylaxis  6)HFpEF-Echo from February 2021 with EF of 60 to 65% with grade 1 diastolic dysfunction -Patient appears dehydrated at this time with AKI -Hold Lasix -Gentle IV hydration as above #1 -Continue metoprolol 25 twice daily  Disposition/Need for in-Hospital Stay- patient unable to be discharged at this time due to --sepsis secondary to UTI requiring IV antibiotics pending culture data, AKI requiring IV fluids  Status is: Inpatient  Remains inpatient appropriate because:sepsis secondary to UTI requiring IV antibiotics pending culture data, AKI requiring IV fluids  Disposition: The patient is from: Home              Anticipated d/c is to: Home              Anticipated d/c date is: 2 days              Patient currently is not medically stable to d/c. Barriers: Not Clinically Stable- sepsis secondary to UTI requiring IV antibiotics pending culture data, AKI requiring IV fluids  Code Status : Full code  Family Communication:  (patient is alert, awake and coherent) Discussed with Daughter Zorita Pang  Consults  :  na  DVT Prophylaxis  :  Lovenox -- SCDs  Lab Results  Component Value Date   PLT 225 04/30/2020   Inpatient Medications  Scheduled Meds: . amitriptyline  50 mg Oral QHS  . aspirin  325 mg Oral Daily  . atorvastatin  40 mg Oral q1800  . cycloSPORINE  1 drop Both Eyes BID  . enoxaparin (LOVENOX) injection  30 mg Subcutaneous Q24H  . gabapentin  300 mg Oral QHS  . insulin  aspart  0-15 Units Subcutaneous TID WC  . insulin aspart  0-5 Units Subcutaneous QHS  . insulin NPH Human  20 Units Subcutaneous QHS  . insulin NPH Human  40 Units Subcutaneous QAC breakfast  . meclizine  25 mg Oral QHS  . metoprolol tartrate  25 mg Oral BID   Continuous Infusions: . sodium chloride 100 mL/hr at 05/01/20 0339  . cefTRIAXone (ROCEPHIN)  IV     PRN Meds:.acetaminophen, albuterol, ondansetron **OR** ondansetron (ZOFRAN) IV   Anti-infectives (From admission, onward)   Start     Dose/Rate Route Frequency Ordered Stop   04/30/20 1645  cefTRIAXone (ROCEPHIN) 1 g in sodium chloride 0.9 % 100 mL IVPB        1 g 200 mL/hr over 30 Minutes Intravenous Every 24 hours 04/30/20 1639     04/30/20 1615  cefTRIAXone (ROCEPHIN) 1 g in sodium chloride 0.9 % 100 mL IVPB        1 g 200 mL/hr over 30 Minutes Intravenous  Once 04/30/20 1605 04/30/20 1713   04/30/20 1545  cefTRIAXone (ROCEPHIN) injection 1 g  Status:  Discontinued        1 g Intramuscular  Once 04/30/20 1544 04/30/20 1605        Objective:   Vitals:   04/30/20 1849 04/30/20 1900 05/01/20 0006 05/01/20 0431  BP:  137/60 (!) 100/42 (!) 107/57  Pulse:  (!) 101 78 68  Resp:  $Remo'16 16 18  'QNMGP$ Temp:  98.3 F (36.8 C) 99.2 F (37.3 C) (!) 97.2 F (36.2 C)  TempSrc:  Oral Oral Oral  SpO2:  98% 95% 96%  Weight: 97.5 kg     Height:        Wt Readings from Last 3 Encounters:  04/30/20 97.5 kg  12/22/19 96.6 kg  10/27/19 98 kg     Intake/Output Summary (Last 24 hours) at 05/01/2020 1011 Last data filed at 05/01/2020 0900 Gross per 24 hour  Intake 1581.89 ml  Output --  Net 1581.89 ml   Physical Exam  Gen:- Awake Alert,  In no apparent distress  HEENT:- [place].AT, No sclera icterus Neck-Supple Neck,No JVD,.  Lungs-  CTAB , fair symmetrical air movement CV- S1, S2 normal, regular  Abd-  +ve B.Sounds, Abd Soft, No tenderness, no CVA area tenderness    Extremity/Skin:- No  edema, pedal pulses present  Psych-affect is  appropriate, oriented x3 Neuro-mild residual right-sided hemiparesis, no new focal deficits, no tremors   Data Review:   Micro Results Recent Results (from the past 240 hour(s))  Respiratory Panel by RT PCR (Flu A&B, Covid) - Nasopharyngeal Swab     Status: None   Collection Time: 04/30/20  5:26 PM   Specimen: Nasopharyngeal Swab  Result Value Ref Range Status   SARS Coronavirus 2 by RT PCR NEGATIVE NEGATIVE Final    Comment: (NOTE) SARS-CoV-2 target  nucleic acids are NOT DETECTED.  The SARS-CoV-2 RNA is generally detectable in upper respiratoy specimens during the acute phase of infection. The lowest concentration of SARS-CoV-2 viral copies this assay can detect is 131 copies/mL. A negative result does not preclude SARS-Cov-2 infection and should not be used as the sole basis for treatment or other patient management decisions. A negative result may occur with  improper specimen collection/handling, submission of specimen other than nasopharyngeal swab, presence of viral mutation(s) within the areas targeted by this assay, and inadequate number of viral copies (<131 copies/mL). A negative result must be combined with clinical observations, patient history, and epidemiological information. The expected result is Negative.  Fact Sheet for Patients:  https://www.moore.com/  Fact Sheet for Healthcare Providers:  https://www.young.biz/  This test is no t yet approved or cleared by the [place] FDA and  has been authorized for detection and/or diagnosis of SARS-CoV-2 by FDA under an Emergency Use Authorization (EUA). This EUA will remain  in effect (meaning this test can be used) for the duration of the COVID-19 declaration under Section 564(b)(1) of the Act, 21 U.S.C. section 360bbb-3(b)(1), unless the authorization is terminated or revoked sooner.     Influenza A by PCR NEGATIVE NEGATIVE Final   Influenza B by PCR NEGATIVE NEGATIVE  Final    Comment: (NOTE) The Xpert Xpress SARS-CoV-2/FLU/RSV assay is intended as an aid in  the diagnosis of influenza from Nasopharyngeal swab specimens and  should not be used as a sole basis for treatment. Nasal washings and  aspirates are unacceptable for Xpert Xpress SARS-CoV-2/FLU/RSV  testing.  Fact Sheet for Patients: https://www.moore.com/  Fact Sheet for Healthcare Providers: https://www.young.biz/  This test is not yet approved or cleared by the [place] FDA and  has been authorized for detection and/or diagnosis of SARS-CoV-2 by  FDA under an Emergency Use Authorization (EUA). This EUA will remain  in effect (meaning this test can be used) for the duration of the  Covid-19 declaration under Section 564(b)(1) of the Act, 21  U.S.C. section 360bbb-3(b)(1), unless the authorization is  terminated or revoked. Performed at [facility], [address] [place], [place] [address]     Radiology Reports US RENAL  Result Date: 05/01/2020 CLINICAL DATA:  AK I EXAM: RENAL / URINARY TRACT ULTRASOUND COMPLETE COMPARISON:  None. FINDINGS: Right Kidney: Renal measurements: 11.9 x 5.2 x 5.4 cm = volume: 172 mL. Echogenicity may be slightly increased. No mass or hydronephrosis visualized. Left Kidney: Renal measurements: 11.8 x 6.6 x 5.6 cm = volume: 224 mL. Echogenicity may be slightly increased. There is a cyst in the inferior pole measuring 5.2 x 4.6 x 3.2 cm. Trace hydronephrosis. Bladder: Appears normal for degree of bladder distention. Other: Adjacent to the bladder in the pelvis incidentally noted is a large septated mass measuring 12.4 x 11.0 x 13.7 cm with peripheral blood flow suggesting ovarian origin. IMPRESSION: 1.  Trace left hydronephrosis.  Large left renal cyst. 2. Possible mild increase in renal echogenicity bilaterally as can be seen in medical renal disease. 3. Incidentally noted large septated mass in the pelvis measuring up to  13.7 cm, possibly ovarian in origin. Patient currently reports no pain in the pelvis. Recommend dedicated pelvic ultrasound for further evaluation. These results will be called to the ordering clinician or representative by the Radiologist Assistant, and communication documented in the PACS or [employer]. Electronically Signed   By: Emmaline Kluver M.D.   On: 05/01/2020 09:43   CUP PACEART REMOTE  DEVICE CHECK  Result Date: 05/01/2020 ILR summary report received. Battery status OK. Normal device function. No new symptom, tachy, brady, or pause episodes. No new AF episodes. Monthly summary reports and ROV/PRN    CBC Recent Labs  Lab 04/30/20 1507  WBC 12.1*  HGB 10.7*  HCT 32.8*  PLT 225  MCV 83.5  MCH 27.2  MCHC 32.6  RDW 15.9*  LYMPHSABS 0.6*  MONOABS 0.3  EOSABS 0.0  BASOSABS 0.0    Chemistries  Recent Labs  Lab 04/30/20 1507  NA 133*  K 3.7  CL 101  CO2 20*  GLUCOSE 289*  BUN 23  CREATININE 1.82*  CALCIUM 8.8*  AST 19  ALT 14  ALKPHOS 110  BILITOT 1.5*   ------------------------------------------------------------------------------------------------------------------ No results for input(s): CHOL, HDL, LDLCALC, TRIG, CHOLHDL, LDLDIRECT in the last 72 hours.  Lab Results  Component Value Date   HGBA1C 7.0 (H) 04/30/2020   ------------------------------------------------------------------------------------------------------------------ No results for input(s): TSH, T4TOTAL, T3FREE, THYROIDAB in the last 72 hours.  Invalid input(s): FREET3 ------------------------------------------------------------------------------------------------------------------ No results for input(s): VITAMINB12, FOLATE, FERRITIN, TIBC, IRON, RETICCTPCT in the last 72 hours.  Coagulation profile No results for input(s): INR, PROTIME in the last 168 hours.  No results for input(s): DDIMER in the last 72 hours.  Cardiac Enzymes No results for input(s): CKMB, TROPONINI,  MYOGLOBIN in the last 168 hours.  Invalid input(s): CK ------------------------------------------------------------------------------------------------------------------ No results found for: BNP   Roxan Hockey M.D on 05/01/2020 at 10:11 AM  Go to [email or web address] - for contact info  [facility] - Office  [phone]

## 2020-05-01 NOTE — Plan of Care (Signed)
  Problem: Activity: Goal: Risk for activity intolerance will decrease Outcome: Progressing   Problem: Pain Managment: Goal: General experience of comfort will improve Outcome: Progressing   Problem: Safety: Goal: Ability to remain free from injury will improve Outcome: Progressing   

## 2020-05-02 ENCOUNTER — Inpatient Hospital Stay (HOSPITAL_COMMUNITY): Payer: 59

## 2020-05-02 ENCOUNTER — Ambulatory Visit (INDEPENDENT_AMBULATORY_CARE_PROVIDER_SITE_OTHER): Payer: 59

## 2020-05-02 DIAGNOSIS — I63512 Cerebral infarction due to unspecified occlusion or stenosis of left middle cerebral artery: Secondary | ICD-10-CM

## 2020-05-02 LAB — GLUCOSE, CAPILLARY
Glucose-Capillary: 109 mg/dL — ABNORMAL HIGH (ref 70–99)
Glucose-Capillary: 141 mg/dL — ABNORMAL HIGH (ref 70–99)
Glucose-Capillary: 50 mg/dL — ABNORMAL LOW (ref 70–99)
Glucose-Capillary: 114 mg/dL — ABNORMAL HIGH (ref 70–99)
Glucose-Capillary: 130 mg/dL — ABNORMAL HIGH (ref 70–99)

## 2020-05-02 LAB — CBC
HCT: 27.4 % — ABNORMAL LOW (ref 36.0–46.0)
Hemoglobin: 9 g/dL — ABNORMAL LOW (ref 12.0–15.0)
MCH: 26.9 pg (ref 26.0–34.0)
MCHC: 32.8 g/dL (ref 30.0–36.0)
MCV: 82 fL (ref 80.0–100.0)
Platelets: 215 10*3/uL (ref 150–400)
RBC: 3.34 MIL/uL — ABNORMAL LOW (ref 3.87–5.11)
RDW: 16 % — ABNORMAL HIGH (ref 11.5–15.5)
WBC: 6.5 10*3/uL (ref 4.0–10.5)
nRBC: 0 % (ref 0.0–0.2)

## 2020-05-02 LAB — BASIC METABOLIC PANEL
Anion gap: 7 (ref 5–15)
BUN: 18 mg/dL (ref 8–23)
CO2: 23 mmol/L (ref 22–32)
Calcium: 8.6 mg/dL — ABNORMAL LOW (ref 8.9–10.3)
Chloride: 110 mmol/L (ref 98–111)
Creatinine, Ser: 1.02 mg/dL — ABNORMAL HIGH (ref 0.44–1.00)
GFR, Estimated: 57 mL/min — ABNORMAL LOW (ref >60)
Glucose, Bld: 109 mg/dL — ABNORMAL HIGH (ref 70–99)
Potassium: 3.3 mmol/L — ABNORMAL LOW (ref 3.5–5.1)
Sodium: 140 mmol/L (ref 135–145)

## 2020-05-02 MED ORDER — POTASSIUM CHLORIDE CRYS ER 20 MEQ PO TBCR
40.0000 meq | EXTENDED_RELEASE_TABLET | ORAL | Status: AC
  Administered 2020-05-02 (×2): 40 meq via ORAL
  Filled 2020-05-02 (×2): qty 2

## 2020-05-02 NOTE — Progress Notes (Signed)
Radiology, Opal Sidles,  called concerning pt's pelvic US.  Complex mass in the pelvis concerning for ovarian neoplasm or malignancy.  Further evaluation with MRI recommended as well as consult with gyneocological surgery.

## 2020-05-02 NOTE — Progress Notes (Signed)
Carelink Summary Report / Loop Recorder 

## 2020-05-02 NOTE — Progress Notes (Signed)
                                           Patient Demographics:    Jean Watts, is a 79 y.o. female, DOB - 11/24/1940, MRN:[id number]  Admit date - 04/30/2020   Admitting Physician Jacob J Stinson, DO  Outpatient Primary MD for the patient is [name], [name], MD  LOS - 2   Chief Complaint  Patient presents with  . 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 Further evaluation with MRI is recommended as well as consultation with gynecological surgery.  Brief Summary:- 79-year-old female with past medical history relevant for DM2, HTN, HLD and prior history of CVA with residual right-sided hemiparesis admitted on 04/30/2020 with concerns for sepsis secondary to UTI and AKI, with abdominal ultrasound suggestive of possible pelvic/ovarian mass  A/p 1)AKI----acute kidney injury due to UTI and dehydration, compounded by Lasix, and benazepril use-    creatinine on admission= 1.82 , baseline creatinine = 0.8   -- renally adjust medications, avoid nephrotoxic agents / dehydration  / hypotension -Hold benazepril, hold Lasix and hold Metformin -Gentle hydration with IV fluids -Renal ultrasound without significant obstructive uropathy  2)Severe  sepsis secondary to E. coli UTI--- on admission patient met sepsis criteria with fevers at home, tachycardia, leukocytosis and UTI with AKI -Continue IV Rocephin and IV fluids pending urine culture results -Blood cultures requested -Sensitivities on E. coli in the urine pending -  3)Large septated mass in the pelvis measuring up to 14.8 x 13.4 x 11.0 cm complex mass-suggestive of ovarian malignancy-discussed with gynecologist Dr. Luther Eure--CA-125 pending, patient will need outpatient follow-up with gynecology oncology/GYN surgery   4)DM2-7.0 reflecting fair diabetic control, -Hold Metformin due to AKI -Continue NPH  insulin regimen and Use Novolog/Humalog Sliding scale insulin with Accu-Cheks/Fingersticks as ordered   5) history of prior stroke with residual right-sided hemiparesis--blood, continue aspirin and Lipitor for secondary stroke prophylaxis  6)HFpEF-Echo from February 2021 with EF of 60 to 65% with grade 1 diastolic dysfunction -Patient appears dehydrated at this time with AKI -Hold Lasix -Gentle IV hydration as above #1 -Continue metoprolol 25 twice daily  7) generalized weakness and deconditioning--PT eval pending  Disposition/Need for in-Hospital Stay- patient unable to be discharged at this time due to --sepsis secondary to UTI requiring IV antibiotics pending culture data, AKI requiring IV fluids  Status is: Inpatient  Remains inpatient appropriate because:sepsis secondary to UTI requiring IV antibiotics pending culture data, AKI requiring IV fluids  Disposition: The patient is from: Home                Anticipated d/c is to: Home              Anticipated d/c date is: 2 days              Patient currently is not medically stable to d/c. Barriers: Not Clinically Stable- sepsis secondary to UTI requiring IV antibiotics pending culture data, AKI requiring IV fluids  Code Status : Full code  Family Communication:  (patient is alert, awake and coherent) Discussed with  Daughter Zorita Pang on 05/01/20 and 05/02/20  Consults  :  na  DVT Prophylaxis  :  Lovenox -- SCDs  Lab Results  Component Value Date   PLT 215 05/02/2020   Inpatient Medications  Scheduled Meds: . amitriptyline  50 mg Oral QHS  . aspirin  325 mg Oral Daily  . atorvastatin  40 mg Oral q1800  . cycloSPORINE  1 drop Both Eyes BID  . enoxaparin (LOVENOX) injection  30 mg Subcutaneous Q24H  . gabapentin  300 mg Oral QHS  . insulin aspart  0-15 Units Subcutaneous TID WC  . insulin aspart  0-5 Units Subcutaneous QHS  . insulin NPH Human  20 Units Subcutaneous QHS  . insulin NPH Human  40 Units Subcutaneous QAC breakfast  . meclizine  25 mg Oral QHS  . metoprolol tartrate  25 mg Oral BID  . potassium chloride  40 mEq Oral Q3H   Continuous Infusions: . sodium chloride 100 mL/hr (05/01/20 2129)  . cefTRIAXone (ROCEPHIN)  IV 1 g (05/01/20 1651)   PRN Meds:.acetaminophen, albuterol, ondansetron **OR** ondansetron (ZOFRAN) IV   Anti-infectives (From admission, onward)   Start     Dose/Rate Route Frequency Ordered Stop   04/30/20 1645  cefTRIAXone (ROCEPHIN) 1 g in sodium chloride 0.9 % 100 mL IVPB        1 g 200 mL/hr over 30 Minutes Intravenous Every 24 hours 04/30/20 1639     04/30/20 1615  cefTRIAXone (ROCEPHIN) 1 g in sodium chloride 0.9 % 100 mL IVPB        1 g 200 mL/hr over 30 Minutes Intravenous  Once 04/30/20 1605 04/30/20 1713   04/30/20 1545  cefTRIAXone (ROCEPHIN) injection 1 g  Status:  Discontinued        1 g Intramuscular  Once 04/30/20 1544 04/30/20 1605        Objective:   Vitals:   05/01/20 0006 05/01/20 0431 05/01/20 2110 05/02/20 0553  BP: (!) 100/42 (!) 107/57 133/74 133/64  Pulse: 78 68 90 65  Resp: _0 Temp: 99.2 F (37.3 C) (!) 97.2 F (36.2 C) 97.9 F (36.6 C) 97.9 F (36.6 C)  TempSrc: Oral Oral Oral Oral  SpO2: 95% 96% 100% 98%  Weight:      Height:        Wt Readings from Last 3 Encounters:  04/30/20 97.5 kg  12/22/19 96.6  kg  10/27/19 98 kg     Intake/Output Summary (Last 24 hours) at 05/02/2020 1205 Last data filed at 05/01/2020 1700 Gross per 24 hour  Intake 480 ml  Output --  Net 480 ml   Physical Exam Gen:- Awake Alert,  In no apparent distress  HEENT:- [place].AT, No sclera icterus Neck-Supple Neck,No JVD,.  Lungs-  CTAB , fair symmetrical air movement CV- S1, S2 normal, regular  Abd-  +ve B.Sounds, Abd Soft, No tenderness, no CVA area tenderness    Extremity/Skin:- No  edema, pedal pulses present  Psych-affect is appropriate, oriented x3 Neuro-mild residual  right-sided hemiparesis, no new focal deficits, no tremors   Data Review:   Micro Results Recent Results (from the past 240 hour(s))  Urine culture     Status: Abnormal (Preliminary result)   Collection Time: 04/30/20 12:15 PM   Specimen: Urine, Clean Catch  Result Value Ref Range Status   Specimen Description   Final    URINE, CLEAN CATCH Performed at [facility], [address], [place], [place] [address]    Special Requests   Final    NONE Performed at [facility], [address] [place], [place] [address]    Culture (A)  Final    >=100,000 COLONIES/mL ESCHERICHIA COLI SUSCEPTIBILITIES TO FOLLOW Performed at [facility] [place] [address] [place], [place] [address]    Report Status PENDING  Incomplete  Respiratory Panel by RT PCR (Flu A&B, Covid) - Nasopharyngeal Swab     Status: None   Collection Time: 04/30/20  5:26 PM   Specimen: Nasopharyngeal Swab  Result Value Ref Range Status   SARS Coronavirus 2 by RT PCR NEGATIVE NEGATIVE Final    Comment: (NOTE) SARS-CoV-2 target nucleic acids are NOT DETECTED.  The SARS-CoV-2 RNA is generally detectable in upper respiratoy specimens during the acute phase of infection. The lowest concentration of SARS-CoV-2 viral copies this assay can detect is 131 copies/mL. A negative result does not preclude SARS-Cov-2 infection and should not be used as the sole basis for treatment  or other patient management decisions. A negative result may occur with  improper specimen collection/handling, submission of specimen other than nasopharyngeal swab, presence of viral mutation(s) within the areas targeted by this assay, and inadequate number of viral copies (<131 copies/mL). A negative result must be combined with clinical observations, patient history, and epidemiological information. The expected result is Negative.  Fact Sheet for Patients:  PinkCheek.be  Fact Sheet for Healthcare Providers:  GravelBags.it  This test is no t yet approved or cleared by the [place] FDA and  has been authorized for detection and/or diagnosis of SARS-CoV-2 by FDA under an Emergency Use Authorization (EUA). This EUA will remain  in effect (meaning this test can be used) for the duration of the COVID-19 declaration under Section 564(b)(1) of the Act, 21 U.S.C. section 360bbb-3(b)(1), unless the authorization is terminated or revoked sooner.     Influenza A by PCR NEGATIVE NEGATIVE Final   Influenza B by PCR NEGATIVE NEGATIVE Final    Comment: (NOTE) The Xpert Xpress SARS-CoV-2/FLU/RSV assay is intended as an aid in  the diagnosis of influenza from Nasopharyngeal swab specimens and  should not be used as a sole basis for treatment. Nasal washings and  aspirates are unacceptable for Xpert Xpress SARS-CoV-2/FLU/RSV  testing.  Fact Sheet for Patients: PinkCheek.be  Fact Sheet for Healthcare Providers: GravelBags.it  This test is not yet approved or cleared by the [place] FDA and  has been authorized for detection and/or diagnosis of SARS-CoV-2 by  FDA under an Emergency Use Authorization (EUA). This EUA will remain  in effect (meaning this test can be used) for the duration of the  Covid-19 declaration under Section 564(b)(1) of the Act, 21  U.S.C.  section 360bbb-3(b)(1), unless the authorization is  terminated or revoked. Performed at [facility], [address] [place], [place] [address]   Culture, blood (Routine X 2) w Reflex to ID Panel     Status: None (Preliminary result)   Collection Time: 05/01/20 11:17 AM   Specimen: BLOOD LEFT WRIST  Result  Value Ref Range Status   Specimen Description BLOOD LEFT WRIST BOTTLES DRAWN AEROBIC ONLY  Final   Special Requests   Final    Blood Culture results may not be optimal due to an inadequate volume of blood received in culture bottles   Culture   Final    NO GROWTH < 24 HOURS Performed at [facility], [address], [place], [place] [address]    Report Status PENDING  Incomplete  Culture, blood (Routine X 2) w Reflex to ID Panel     Status: None (Preliminary result)   Collection Time: 05/01/20 11:17 AM   Specimen: BLOOD LEFT HAND  Result Value Ref Range Status   Specimen Description   Final    BLOOD LEFT HAND BOTTLES DRAWN AEROBIC AND ANAEROBIC   Special Requests Blood Culture adequate volume  Final   Culture   Final    NO GROWTH < 24 HOURS Performed at [facility], [address] [place], [place] [address]    Report Status PENDING  Incomplete    Radiology Reports US RENAL  Result Date: 05/01/2020 CLINICAL DATA:  AK I EXAM: RENAL / URINARY TRACT ULTRASOUND COMPLETE COMPARISON:  None. FINDINGS: Right Kidney: Renal measurements: 11.9 x 5.2 x 5.4 cm = volume: 172 mL. Echogenicity may be slightly increased. No mass or hydronephrosis visualized. Left Kidney: Renal measurements: 11.8 x 6.6 x 5.6 cm = volume: 224 mL. Echogenicity may be slightly increased. There is a cyst in the inferior pole measuring 5.2 x 4.6 x 3.2 cm. Trace hydronephrosis. Bladder: Appears normal for degree of bladder distention. Other: Adjacent to the bladder in the pelvis incidentally noted is a large septated mass measuring 12.4 x 11.0 x 13.7 cm with peripheral blood flow suggesting ovarian origin. IMPRESSION: 1.   Trace left hydronephrosis.  Large left renal cyst. 2. Possible mild increase in renal echogenicity bilaterally as can be seen in medical renal disease. 3. Incidentally noted large septated mass in the pelvis measuring up to 13.7 cm, possibly ovarian in origin. Patient currently reports no pain in the pelvis. Recommend dedicated pelvic ultrasound for further evaluation. These results will be called to the ordering clinician or representative by the Radiologist Assistant, and communication documented in the PACS or [employer]. Electronically Signed   By: Audie Pinto M.D.   On: 05/01/2020 09:43   CUP PACEART REMOTE DEVICE CHECK  Result Date: 05/01/2020 ILR summary report received. Battery status OK. Normal device function. No new symptom, tachy, brady, or pause episodes. No new AF episodes. Monthly summary reports and ROV/PRN  US PELVIC COMPLETE WITH TRANSVAGINAL  Result Date: 05/02/2020 CLINICAL DATA:  Pelvic mass. EXAM: TRANSABDOMINAL AND TRANSVAGINAL ULTRASOUND OF PELVIS TECHNIQUE: Both transabdominal and transvaginal ultrasound examinations of the pelvis were performed. Transabdominal technique was performed for global imaging of the pelvis including uterus, ovaries, adnexal regions, and pelvic cul-de-sac. It was necessary to proceed with endovaginal exam following the transabdominal exam to visualize the adnexal regions. COMPARISON:  Ultrasound of May 01, 2020. FINDINGS: Status post hysterectomy. Right ovary Not visualized. Left ovary Not visualized. Other findings Large complex mass is noted with multiple septations and probable solid components in the pelvis, which measures 14.8 x 13.4 by 11.0 cm. Potentially this may represent ovarian neoplasm, and further evaluation with MRI and consultation with gynecological surgery is recommended. Color Doppler demonstrates flow within this lesion. No free fluid is noted. IMPRESSION: 14.8 x 13.4 x 11.0 cm complex mass is noted in the pelvis  concerning for ovarian neoplasm  or malignancy. Further evaluation with MRI is recommended as well as consultation with gynecological surgery. These results will be called to the ordering clinician or representative by the Radiologist Assistant, and communication documented in the PACS or [employer] Dashboard. Electronically Signed   By: James  Green Jr M.D.   On: 05/02/2020 11:00     CBC Recent Labs  Lab 04/30/20 1507 05/01/20 0817 05/02/20 0759  WBC 12.1* 11.2* 6.5  HGB 10.7* 9.3* 9.0*  HCT 32.8* 28.4* 27.4*  PLT 225 208 215  MCV 83.5 81.8 82.0  MCH 27.2 26.8 26.9  MCHC 32.6 32.7 32.8  RDW 15.9* 15.8* 16.0*  LYMPHSABS 0.6*  --   --   MONOABS 0.3  --   --   EOSABS 0.0  --   --   BASOSABS 0.0  --   --     Chemistries  Recent Labs  Lab 04/30/20 1507 05/01/20 0817 05/02/20 0759  NA 133* 136 140  K 3.7 3.2* 3.3*  CL 101 106 110  CO2 20* 22 23  GLUCOSE 289* 181* 109*  BUN 23 26* 18  CREATININE 1.82* 1.37* 1.02*  CALCIUM 8.8* 8.6* 8.6*  AST 19  --   --   ALT 14  --   --   ALKPHOS 110  --   --   BILITOT 1.5*  --   --    ------------------------------------------------------------------------------------------------------------------ No results for input(s): CHOL, HDL, LDLCALC, TRIG, CHOLHDL, LDLDIRECT in the last 72 hours.  Lab Results  Component Value Date   HGBA1C 7.0 (H) 04/30/2020   ------------------------------------------------------------------------------------------------------------------ No results for input(s): TSH, T4TOTAL, T3FREE, THYROIDAB in the last 72 hours.  Invalid input(s): FREET3 ------------------------------------------------------------------------------------------------------------------ No results for input(s): VITAMINB12, FOLATE, FERRITIN, TIBC, IRON, RETICCTPCT in the last 72 hours.  Coagulation profile No results for input(s): INR, PROTIME in the last 168 hours.  No results for input(s): DDIMER in the last 72 hours.  Cardiac  Enzymes No results for input(s): CKMB, TROPONINI, MYOGLOBIN in the last 168 hours.  Invalid input(s): CK ------------------------------------------------------------------------------------------------------------------ No results found for: BNP   Courage Emokpae M.D on 05/02/2020 at 12:05 PM  Go to [email or web address] - for contact info  [facility] - Office  [phone]       

## 2020-05-02 NOTE — Evaluation (Signed)
Physical Therapy Evaluation Patient Details Name: Jean Watts MRN: [id number] DOB: August 18, 1940 Today's Date: 05/02/2020   History of Present Illness  79 year old female with past medical history relevant for DM2, HTN, HLD and prior history of CVA with residual right-sided hemiparesis admitted on 04/30/2020 with concerns for sepsis secondary to UTI and AKI, with abdominal ultrasound suggestive of possible pelvic/ovarian mass    Clinical Impression  Patient evaluated by Physical Therapy with no further acute PT needs identified. All education has been completed and the patient has no further questions. Patient is modified independent with all mobility and utilizes personal [facility] for ambulation. Patient was able to stoop over to pick up an object off the floor without external support and without loss of balance. See below for any follow-up Physical Therapy or equipment needs. PT is signing off. Thank you for this referral.     Follow Up Recommendations No PT follow up;Supervision - Intermittent;Supervision for mobility/OOB    Equipment Recommendations  None recommended by PT    Recommendations for Other Services       Precautions / Restrictions Precautions Precautions: Fall Precaution Comments: 2-3 falls reported in last 6 months; reports dizziness in sitting and standing Restrictions Weight Bearing Restrictions: No      Mobility  Bed Mobility Overal bed mobility: Modified Independent  General bed mobility comments: increased time    Transfers Overall transfer level: Modified independent  General transfer comment: increased time  Ambulation/Gait Ambulation/Gait assistance: Modified independent (Device/Increase time) Gait Distance (Feet): 50 Feet Assistive device: Straight cane Gait Pattern/deviations: Step-through pattern;Decreased step length - right;Decreased step length - left;Decreased stride length;Wide base of support Gait velocity: decreased   General Gait Details:  somewhat slow, somewhat labored movement, reaching for objects without assistive device; more steady with personal SPC found in room. Patient was able to stoop over and pick object up from floor without loss of balance  Stairs      Wheelchair Mobility    Modified Rankin (Stroke Patients Only)       Balance Overall balance assessment: Mild deficits observed, not formally tested;History of Falls           Pertinent Vitals/Pain Pain Assessment: No/denies pain    Home Living Family/patient expects to be discharged to:: Private residence Living Arrangements: Children Available Help at Discharge: Available 24 hours/day;Family Type of Home: Apartment Home Access: Level entry     Home Layout: One level Home Equipment: Cane - single point;Other (comment) (lift chair)      Prior Function Level of Independence: Needs assistance   Gait / Transfers Assistance Needed: mostly household ambulator with [facility]  ADL's / Homemaking Assistance Needed: daughter cooks, cleans, launders, independent with BADL's        Hand Dominance        Extremity/Trunk Assessment   Upper Extremity Assessment Upper Extremity Assessment: Overall WFL for tasks assessed    Lower Extremity Assessment Lower Extremity Assessment: Overall WFL for tasks assessed    Cervical / Trunk Assessment Cervical / Trunk Assessment: Normal  Communication   Communication: No difficulties  Cognition Arousal/Alertness: Awake/alert Behavior During Therapy: WFL for tasks assessed/performed Overall Cognitive Status: Within Functional Limits for tasks assessed        General Comments      Exercises     Assessment/Plan    PT Assessment Patent does not need any further PT services  PT Problem List         PT Treatment Interventions      PT Goals (Current  goals can be found in the Care Plan section)  Acute Rehab PT Goals Patient Stated Goal: Go home with daughter. PT Goal Formulation: With  patient Time For Goal Achievement: 05/09/20 Potential to Achieve Goals: Good    Frequency     Barriers to discharge           AM-PAC PT "6 Clicks" Mobility  Outcome Measure Help needed turning from your back to your side while in a flat bed without using bedrails?: None Help needed moving from lying on your back to sitting on the side of a flat bed without using bedrails?: None Help needed moving to and from a bed to a chair (including a wheelchair)?: None Help needed standing up from a chair using your arms (e.g., wheelchair or bedside chair)?: None Help needed to walk in hospital room?: A Little Help needed climbing 3-5 steps with a railing? : A Little 6 Click Score: 22    End of Session   Activity Tolerance: Patient tolerated treatment well Patient left: in chair;with call bell/phone within reach Nurse Communication: Mobility status PT Visit Diagnosis: Unsteadiness on feet (R26.81);History of falling (Z91.81)    Time: 5784-6962 PT Time Calculation (min) (ACUTE ONLY): 23 min   Charges:   PT Evaluation $PT Eval Low Complexity: 1 Low PT Treatments $Therapeutic Activity: 8-22 mins        Floria Raveling. Hartnett-Rands, MS, PT Per [place] #[id number] 05/02/2020, 1:29 PM

## 2020-05-02 NOTE — Progress Notes (Signed)
Hypoglycemic Event  CBG: 50  Treatment: 8 oz orange juice given  Symptoms: None  Follow-up CBG: Time: 1732  CBG Result:  114   Possible Reasons for Event: Unknown  Comments/MD notified:Jean Watts    Jean Watts

## 2020-05-03 ENCOUNTER — Inpatient Hospital Stay (HOSPITAL_COMMUNITY): Payer: 59

## 2020-05-03 LAB — GLUCOSE, CAPILLARY
Glucose-Capillary: 45 mg/dL — ABNORMAL LOW (ref 70–99)
Glucose-Capillary: 140 mg/dL — ABNORMAL HIGH (ref 70–99)
Glucose-Capillary: 64 mg/dL — ABNORMAL LOW (ref 70–99)
Glucose-Capillary: 207 mg/dL — ABNORMAL HIGH (ref 70–99)
Glucose-Capillary: 173 mg/dL — ABNORMAL HIGH (ref 70–99)

## 2020-05-03 LAB — URINE CULTURE: Culture: >=100000 — AB

## 2020-05-03 LAB — CA 125: Cancer Antigen (CA) 125: 6 U/mL (ref 0.0–38.1)

## 2020-05-03 MED ORDER — ACETAMINOPHEN 325 MG PO TABS: 650.0000 mg | ORAL_TABLET | ORAL | 2 refills | Status: DC | PRN

## 2020-05-03 MED ORDER — INSULIN NPH (HUMAN) (ISOPHANE) 100 UNIT/ML ~~LOC~~ SUSP
10.0000 [IU] | Freq: Every day | SUBCUTANEOUS | Status: DC
  Filled 2020-05-03: qty 10

## 2020-05-03 MED ORDER — AMLODIPINE BESY-BENAZEPRIL HCL 5-10 MG PO CAPS: 1.0000 | ORAL_CAPSULE | Freq: Every day | ORAL | 3 refills | Status: AC

## 2020-05-03 MED ORDER — CEPHALEXIN 500 MG PO CAPS: 500.0000 mg | ORAL_CAPSULE | Freq: Three times a day (TID) | ORAL | 0 refills | Status: AC

## 2020-05-03 MED ORDER — ASPIRIN 325 MG PO TABS: 325.0000 mg | ORAL_TABLET | Freq: Every day | ORAL | 3 refills | Status: AC

## 2020-05-03 MED ORDER — IOHEXOL 300 MG/ML  SOLN
100.0000 mL | Freq: Once | INTRAMUSCULAR | Status: AC | PRN
  Administered 2020-05-03: 100 mL via INTRAVENOUS

## 2020-05-03 MED ORDER — INSULIN NPH (HUMAN) (ISOPHANE) 100 UNIT/ML ~~LOC~~ SUSP
30.0000 [IU] | Freq: Every day | SUBCUTANEOUS | Status: DC
  Filled 2020-05-03: qty 10

## 2020-05-03 MED ORDER — HUMULIN N 100 UNIT/ML ~~LOC~~ SUSP
15.0000 [IU] | SUBCUTANEOUS | 11 refills | Status: DC
Start: 2020-05-03 — End: 2024-01-13

## 2020-05-03 NOTE — Progress Notes (Addendum)
Inpatient Diabetes Program Recommendations  AACE/ADA: New Consensus Statement on Inpatient Glycemic Control   Target Ranges:  Prepandial:   less than 140 mg/dL      Peak postprandial:   less than 180 mg/dL (1-2 hours)      Critically ill patients:  140 - 180 mg/dL  Results for Jean Watts, Jean Watts (MRN [id number]) as of 05/03/2020 09:38  Ref. Range 05/03/2020 07:17 05/03/2020 07:36 05/03/2020 08:25  Glucose-Capillary Latest Ref Range: 70 - 99 mg/dL 45 (L) 64 (L) 140 (H)   Results for Jean Watts, Jean Watts (MRN [id number]) as of 05/03/2020 07:04  Ref. Range 05/02/2020 08:05 05/02/2020 11:32 05/02/2020 16:47 05/02/2020 17:32 05/02/2020 21:19  Glucose-Capillary Latest Ref Range: 70 - 99 mg/dL 109 (H) 141 (H) 50 (L) 114 (H) 130 (H)   Review of Glycemic Control  Diabetes history: DM2 Outpatient Diabetes medications: NPH 40 units QAM, NPH 20 units QPM, Metformin 1000 mg BID Current orders for Inpatient glycemic control: NPH 40 units QAM, NPH 20 units QHS, Novolog 0-15 units TID with meals, Novolog 0-5 units QHS  Inpatient Diabetes Program Recommendations:    Insulin: Please consider decreasing NPH to 30 units QAM and NPH 10 units QHS.  Thanks, Barnie Alderman, RN, MSN, CDE Diabetes Coordinator Inpatient Diabetes Program [phone] (Team Pager from 8am to 5pm)

## 2020-05-03 NOTE — Discharge Summary (Signed)
Jean Watts, is a 79 y.o. female  DOB September 06, 1940  MRN [id number].  Admission date:  04/30/2020  Admitting Physician  Truett Mainland, DO  Discharge Date:  05/03/2020   Primary MD  Rosita Fire, MD  Recommendations for primary care physician for things to follow:   1) you have been found to have a pelvic/ovarian mass--- he need to follow-up with the gynecological oncologist-- Dr. Denman George or Dr. Berline Lopes at [facility] gynecological oncology department -next Wednesday 05/11/20--- Please call [phone] tomorrow to confirm the time for your appointment -It is very important that you keep this appointment in order to have further work-up of this pelvic mass to determine if it is cancer and to discuss further diagnostic and treatment options for rate  2)Avoid ibuprofen/Advil/Aleve/Motrin/Goody Powders/Naproxen/BC powders/Meloxicam/Diclofenac/Indomethacin and other Nonsteroidal anti-inflammatory medications as these will make you more likely to bleed and can cause stomach ulcers, can also cause Kidney problems.   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 Ovarian Origin   Essential hypertension   Diabetes mellitus type II, uncontrolled ([place])   Status post stroke--residual right-sided hemiparesis   Hyperlipidemia LDL goal <70   Blind right eye      Past Medical History:  Diagnosis Date  . Dyspnea   . HLD (hyperlipidemia)   . HTN (hypertension)     . Neuropathy   . Stroke [facility])    no residual   . Type 2 diabetes mellitus with diabetic polyneuropathy ([place])   . Visual loss    right eye    Past Surgical History:  Procedure Laterality Date  . ABDOMINAL HYSTERECTOMY    . BREAST BIOPSY  02/2019  . BREAST LUMPECTOMY WITH RADIOACTIVE SEED LOCALIZATION Right 12/22/2019   Procedure: RIGHT BREAST LUMPECTOMY X 2 WITH RADIOACTIVE SEED LOCALIZATION;  Surgeon: Coralie Keens, MD;  Location: [place];  Service: General;  Laterality: Right;  . FLEXIBLE SIGMOIDOSCOPY N/A 06/04/2019   Procedure: FLEXIBLE SIGMOIDOSCOPY;  Surgeon: Daneil Dolin, MD;  [facility];  Service: Endoscopy;  Laterality: N/A;  incomplete colonoscopy, poor prep  . IR CT HEAD LTD  08/04/2019  . IR PERCUTANEOUS ART THROMBECTOMY/INFUSION INTRACRANIAL INC DIAG ANGIO  08/04/2019  . LOOP RECORDER INSERTION N/A 08/06/2019   Procedure: LOOP RECORDER INSERTION;  Surgeon: Thompson Grayer, MD;  Location: [place] CV LAB;  Service: Cardiovascular;  Laterality: N/A;  . RADIOLOGY WITH ANESTHESIA N/A 08/04/2019   Procedure: IR WITH ANESTHESIA;  Surgeon: Radiologist, Medication, MD;  Location: [place];  Service: Radiology;  Laterality: N/A;       HPI  from  the history and physical done on the day of admission:    Chief Complaint: fevers, chills  HPI: Jean Watts is a 79 y.o. female with a history of stroke, type 2 diabetes, hypertension, hyperlipidemia.  Patient came to the hospital due to fevers and chills that started yesterday.  Over the past 2 days, the patient has experienced frequency, urgency, fevers and shaking chills.  No abdominal pain.  Has diminished appetite.  No diarrhea, constipation, vomiting, chest pain, shortness of breath.  No palliating or provoking factors.  Emergency Department Course: UA significant for UTI.  Creatinine 1.8 with baseline less than 1.  White count 12.  Urine culture sent, Rocephin started, IV fluid bolus.      Hospital Course:      Pelvic Ultrasound IMPRESSION:- 14.8 x 13.4 x 11.0 cm complex mass is noted in the pelvis concerning for ovarian neoplasm or malignancy. Further evaluation with MRI is recommended as well as consultation with gynecological surgery.  Brief Summary:- 79 year old female with past medical history relevant for DM2, HTN, HLD and prior history of CVA with residual right-sided hemiparesis admitted on 04/30/2020 with concerns for sepsis secondary to UTI and AKI, with abdominal ultrasound suggestive of possible pelvic/ovarian mass  A/p 1)AKI----acute kidney injury due to UTI and dehydration, compounded by Lasix, and benazepril use-    creatinine on admission= 1.82 , baseline creatinine = 0.8   -- renally adjust medications, avoid nephrotoxic agents / dehydration  / hypotension -Normalized, creatinine down to 1.0 with hydration -Renal ultrasound without significant obstructive uropathy  2)Severe sepsis secondary to E. coli UTI--- on admission patient met sepsis criteria with fevers at home, tachycardia, leukocytosis and UTI with AKI -Blood cultures NGTD -Treated with IV Rocephin, okay to discharge on Keflex - sepsis pathophysiology has resolved -  3)Large septated mass in the pelvis measuring up to 14.8 x 13.4 x 11.0 cm complex mass-suggestive of ovarian malignancy-discussed with gynecologist Dr. Tania Ade- --CA-125 is not elevated  - patient will need outpatient follow-up with gynecology oncology/GYN surgery --Dr. Denman George and Dr. Berline Lopes next Wednesday -Unable to do pelvic MRI as patient has a loop recorder -May be able to do CT abdomen and pelvis with oral and IV contrast as outpatient  4)DM2-7.0 reflecting fair diabetic control, -NPH adjusted downward, okay to restart Metformin   5) history of prior stroke with residual right-sided hemiparesis--blood, continue aspirin and Lipitor for secondary stroke prophylaxis  6)HFpEF-Echo from February 2021 with EF of 60 to  65% with grade 1 diastolic dysfunction -Continue metoprolol 25 twice daily -Okay to restart Lasix  7)Generalized weakness and deconditioning--PT eval appreciated, no further PT needs identified  Disposition--discharge home with daughter need to follow-up with GYN oncology emphasized to patient and also to her daughter  Disposition: The patient is from: Home  Anticipated d/c is to: Home    Family Communication:  (patient is alert, awake and coherent) Discussed with Daughter Jean Watts on 05/01/20 and 05/02/20 and 05/03/20  Consults  :  Dr Kendell Bane  Discharge Condition: Stable  Follow UP--GYN oncology as outpatient Diet and Activity recommendation:  As advised  Discharge Instructions    * Discharge Instructions    Call MD for:  difficulty breathing, headache or visual disturbances   Complete by: As directed    Call MD for:  persistant dizziness or light-headedness   Complete by: As directed    Call MD for:  persistant nausea and vomiting   Complete by: As directed    Call MD for:  severe  uncontrolled pain   Complete by: As directed    Call MD for:  temperature >100.4   Complete by: As directed    Diet - low sodium heart healthy   Complete by: As directed    Diet Carb Modified   Complete by: As directed    Discharge instructions   Complete by: As directed    1) you have been found to have a pelvic/ovarian mass--- he need to follow-up with the gynecological oncologist-- Dr. Denman George or Dr. Berline Lopes at [facility] gynecological oncology department -next Wednesday 05/11/20--- Please call [phone] tomorrow to confirm the time for your appointment -It is very important that you keep this appointment in order to have further work-up of this pelvic mass to determine if it is cancer and to discuss further diagnostic and treatment options for rate  2)Avoid ibuprofen/Advil/Aleve/Motrin/Goody Powders/Naproxen/BC  powders/Meloxicam/Diclofenac/Indomethacin and other Nonsteroidal anti-inflammatory medications as these will make you more likely to bleed and can cause stomach ulcers, can also cause Kidney problems.   Increase activity slowly   Complete by: As directed         Discharge Medications     Allergies as of 05/03/2020   No Known Allergies     Medication List    TAKE these medications   acetaminophen 325 MG tablet Commonly known as: TYLENOL Take 2 tablets (650 mg total) by mouth every 4 (four) hours as needed for headache.   albuterol (2.5 MG/3ML) 0.083% nebulizer solution Commonly known as: PROVENTIL Inhale 2.5 mg into the lungs every 6 (six) hours as needed for wheezing or shortness of breath.   amitriptyline 50 MG tablet Commonly known as: ELAVIL Take 50 mg by mouth at bedtime.   amLODipine-benazepril 5-10 MG capsule Commonly known as: LOTREL Take 1 capsule by mouth daily.   aspirin 325 MG tablet Take 1 tablet (325 mg total) by mouth daily with breakfast. What changed: when to take this   atorvastatin 40 MG tablet Commonly known as: LIPITOR Take 1 tablet (40 mg total) by mouth daily at 6 PM.   cephALEXin 500 MG capsule Commonly known as: Keflex Take 1 capsule (500 mg total) by mouth 3 (three) times daily for 3 days.   furosemide 20 MG tablet Commonly known as: LASIX Take 20 mg by mouth as needed.   gabapentin 300 MG capsule Commonly known as: NEURONTIN Take 300 mg by mouth at bedtime. What changed: Another medication with the same name was removed. Continue taking this medication, and follow the directions you see here.   HumuLIN N 100 UNIT/ML injection Generic drug: insulin NPH Human Inject 0.15-0.35 mLs (15-35 Units total) into the skin See admin instructions. Inject 35 units into the skin in the morning and 15 units in the evening What changed:   how much to take  additional instructions   meclizine 25 MG tablet Commonly known as: ANTIVERT Take 25 mg  by mouth at bedtime.   metFORMIN 1000 MG tablet Commonly known as: GLUCOPHAGE Take 1,000 mg by mouth 2 (two) times daily.   metoprolol tartrate 25 MG tablet Commonly known as: LOPRESSOR Take 25 mg by mouth 2 (two) times daily.   Restasis 0.05 % ophthalmic emulsion Generic drug: cycloSPORINE 1 drop 2 (two) times daily.   traMADol 50 MG tablet Commonly known as: ULTRAM Take 1 tablet (50 mg total) by mouth every 6 (six) hours as needed for moderate pain.       Major procedures and Radiology Reports - PLEASE review detailed and final  reports for all details, in brief -     US RENAL  Result Date: 05/01/2020 CLINICAL DATA:  AK I EXAM: RENAL / URINARY TRACT ULTRASOUND COMPLETE COMPARISON:  None. FINDINGS: Right Kidney: Renal measurements: 11.9 x 5.2 x 5.4 cm = volume: 172 mL. Echogenicity may be slightly increased. No mass or hydronephrosis visualized. Left Kidney: Renal measurements: 11.8 x 6.6 x 5.6 cm = volume: 224 mL. Echogenicity may be slightly increased. There is a cyst in the inferior pole measuring 5.2 x 4.6 x 3.2 cm. Trace hydronephrosis. Bladder: Appears normal for degree of bladder distention. Other: Adjacent to the bladder in the pelvis incidentally noted is a large septated mass measuring 12.4 x 11.0 x 13.7 cm with peripheral blood flow suggesting ovarian origin. IMPRESSION: 1.  Trace left hydronephrosis.  Large left renal cyst. 2. Possible mild increase in renal echogenicity bilaterally as can be seen in medical renal disease. 3. Incidentally noted large septated mass in the pelvis measuring up to 13.7 cm, possibly ovarian in origin. Patient currently reports no pain in the pelvis. Recommend dedicated pelvic ultrasound for further evaluation. These results will be called to the ordering clinician or representative by the Radiologist Assistant, and communication documented in the PACS or [employer]. Electronically Signed   By: Audie Pinto M.D.   On: 05/01/2020 09:43    CUP PACEART REMOTE DEVICE CHECK  Result Date: 05/01/2020 ILR summary report received. Battery status OK. Normal device function. No new symptom, tachy, brady, or pause episodes. No new AF episodes. Monthly summary reports and ROV/PRN  US PELVIC COMPLETE WITH TRANSVAGINAL  Result Date: 05/02/2020 CLINICAL DATA:  Pelvic mass. EXAM: TRANSABDOMINAL AND TRANSVAGINAL ULTRASOUND OF PELVIS TECHNIQUE: Both transabdominal and transvaginal ultrasound examinations of the pelvis were performed. Transabdominal technique was performed for global imaging of the pelvis including uterus, ovaries, adnexal regions, and pelvic cul-de-sac. It was necessary to proceed with endovaginal exam following the transabdominal exam to visualize the adnexal regions. COMPARISON:  Ultrasound of May 01, 2020. FINDINGS: Status post hysterectomy. Right ovary Not visualized. Left ovary Not visualized. Other findings Large complex mass is noted with multiple septations and probable solid components in the pelvis, which measures 14.8 x 13.4 by 11.0 cm. Potentially this may represent ovarian neoplasm, and further evaluation with MRI and consultation with gynecological surgery is recommended. Color Doppler demonstrates flow within this lesion. No free fluid is noted. IMPRESSION: 14.8 x 13.4 x 11.0 cm complex mass is noted in the pelvis concerning for ovarian neoplasm or malignancy. Further evaluation with MRI is recommended as well as consultation with gynecological surgery. These results will be called to the ordering clinician or representative by the Radiologist Assistant, and communication documented in the PACS or [employer]. Electronically Signed   By: Marijo Conception M.D.   On: 05/02/2020 11:00    Micro Results    Recent Results (from the past 240 hour(s))  Urine culture     Status: Abnormal   Collection Time: 04/30/20 12:15 PM   Specimen: Urine, Clean Catch  Result Value Ref Range Status   Specimen Description   Final     URINE, CLEAN CATCH Performed at [facility], [address] [place], [place] [address]    Special Requests   Final    NONE Performed at [facility], [address] [place], [place] [address]    Culture >=100,000 COLONIES/mL ESCHERICHIA COLI (A)  Final   Report Status 05/03/2020 FINAL  Final   Organism ID, Bacteria ESCHERICHIA COLI (A)  Final      Susceptibility   Escherichia coli - MIC*    AMPICILLIN <=2 SENSITIVE Sensitive     CEFAZOLIN <=4 SENSITIVE Sensitive     CEFEPIME <=0.12 SENSITIVE Sensitive     CEFTRIAXONE <=0.25 SENSITIVE Sensitive     CIPROFLOXACIN >=4 RESISTANT Resistant     GENTAMICIN 2 SENSITIVE Sensitive     IMIPENEM <=0.25 SENSITIVE Sensitive     NITROFURANTOIN 32 SENSITIVE Sensitive     TRIMETH/SULFA <=20 SENSITIVE Sensitive     AMPICILLIN/SULBACTAM <=2 SENSITIVE Sensitive     PIP/TAZO <=4 SENSITIVE Sensitive     * >=100,000 COLONIES/mL ESCHERICHIA COLI  Respiratory Panel by RT PCR (Flu A&B, Covid) - Nasopharyngeal Swab     Status: None   Collection Time: 04/30/20  5:26 PM   Specimen: Nasopharyngeal Swab  Result Value Ref Range Status   SARS Coronavirus 2 by RT PCR NEGATIVE NEGATIVE Final    Comment: (NOTE) SARS-CoV-2 target nucleic acids are NOT DETECTED.  The SARS-CoV-2 RNA is generally detectable in upper respiratoy specimens during the acute phase of infection. The lowest concentration of SARS-CoV-2 viral copies this assay can detect is 131 copies/mL. A negative result does not preclude SARS-Cov-2 infection and should not be used as the sole basis for treatment or other patient management decisions. A negative result may occur with  improper specimen collection/handling, submission of specimen other than nasopharyngeal swab, presence of viral mutation(s) within the areas targeted by this assay, and inadequate number of viral copies (<131 copies/mL). A negative result must be combined with clinical observations, patient history, and epidemiological  information. The expected result is Negative.  Fact Sheet for Patients:  PinkCheek.be  Fact Sheet for Healthcare Providers:  GravelBags.it  This test is no t yet approved or cleared by the [place] FDA and  has been authorized for detection and/or diagnosis of SARS-CoV-2 by FDA under an Emergency Use Authorization (EUA). This EUA will remain  in effect (meaning this test can be used) for the duration of the COVID-19 declaration under Section 564(b)(1) of the Act, 21 U.S.C. section 360bbb-3(b)(1), unless the authorization is terminated or revoked sooner.     Influenza A by PCR NEGATIVE NEGATIVE Final   Influenza B by PCR NEGATIVE NEGATIVE Final    Comment: (NOTE) The Xpert Xpress SARS-CoV-2/FLU/RSV assay is intended as an aid in  the diagnosis of influenza from Nasopharyngeal swab specimens and  should not be used as a sole basis for treatment. Nasal washings and  aspirates are unacceptable for Xpert Xpress SARS-CoV-2/FLU/RSV  testing.  Fact Sheet for Patients: PinkCheek.be  Fact Sheet for Healthcare Providers: GravelBags.it  This test is not yet approved or cleared by the [place] FDA and  has been authorized for detection and/or diagnosis of SARS-CoV-2 by  FDA under an Emergency Use Authorization (EUA). This EUA will remain  in effect (meaning this test can be used) for the duration of the  Covid-19 declaration under Section 564(b)(1) of the Act, 21  U.S.C. section 360bbb-3(b)(1), unless the authorization is  terminated or revoked. Performed at [facility], [address] [place], [place] [address]   Culture, blood (Routine X 2) w Reflex to ID Panel     Status: None (Preliminary result)   Collection Time: 05/01/20 11:17 AM   Specimen: BLOOD LEFT WRIST  Result Value Ref Range Status   Specimen Description BLOOD LEFT WRIST BOTTLES DRAWN AEROBIC ONLY   Final   Special Requests   Final    Blood Culture  results may not be optimal due to an inadequate volume of blood received in culture bottles   Culture   Final    NO GROWTH 2 DAYS Performed at [facility], [address], [place], [place] [address]    Report Status PENDING  Incomplete  Culture, blood (Routine X 2) w Reflex to ID Panel     Status: None (Preliminary result)   Collection Time: 05/01/20 11:17 AM   Specimen: BLOOD LEFT HAND  Result Value Ref Range Status   Specimen Description   Final    BLOOD LEFT HAND BOTTLES DRAWN AEROBIC AND ANAEROBIC   Special Requests Blood Culture adequate volume  Final   Culture   Final    NO GROWTH 2 DAYS Performed at [facility], [address], [place], [place] [address]    Report Status PENDING  Incomplete       Today   Subjective    Jean Watts today has no new complaints No fever  Or chills  -Eating and drinking well  No Nausea, Vomiting or Diarrhea Denies abdominal or pelvic pain at this time         Patient has been seen and examined prior to discharge   Objective   Blood pressure (!) 159/73, pulse 64, temperature 98.1 F (36.7 C), temperature source Oral, resp. rate 20, height 5' 5" (1.651 m), weight 97.5 kg, SpO2 100 %.   Intake/Output Summary (Last 24 hours) at 05/03/2020 1734 Last data filed at 05/03/2020 1300 Gross per 24 hour  Intake 3918.78 ml  Output --  Net 3918.78 ml    Exam Physical Exam Gen:- Awake Alert,  In no apparent distress  HEENT:- [place].AT, No sclera icterus, right eye vision loss Neck-Supple Neck,No JVD,.  Lungs-  CTAB , fair symmetrical air movement CV- S1, S2 normal, regular  Abd-  +ve B.Sounds, Abd Soft, No tenderness, no CVA area tenderness    Extremity/Skin:- No  edema, pedal pulses present  Psych-affect is appropriate, oriented x3 Neuro-mild residual right-sided hemiparesis, no new focal deficits, no tremors    Data Review   CBC w Diff:  Lab Results  Component Value Date    WBC 6.5 05/02/2020   HGB 9.0 (L) 05/02/2020   HCT 27.4 (L) 05/02/2020   PLT 215 05/02/2020   LYMPHOPCT 5 04/30/2020   MONOPCT 3 04/30/2020   EOSPCT 0 04/30/2020   BASOPCT 0 04/30/2020    CMP:  Lab Results  Component Value Date   NA 140 05/02/2020   K 3.3 (L) 05/02/2020   CL 110 05/02/2020   CO2 23 05/02/2020   BUN 18 05/02/2020   CREATININE 1.02 (H) 05/02/2020   PROT 7.2 04/30/2020   ALBUMIN 3.5 04/30/2020   BILITOT 1.5 (H) 04/30/2020   ALKPHOS 110 04/30/2020   AST 19 04/30/2020   ALT 14 04/30/2020  .   Total Discharge time is about 33 minutes  Roxan Hockey M.D on 05/03/2020 at 5:34 PM  Go to [email or web address] -  for contact info  [facility] - Office  [phone]

## 2020-05-03 NOTE — Care Management Important Message (Signed)
Important Message  Patient Details  Name: Jean Watts MRN: [id number] Date of Birth: [birth year]   Medicare Important Message Given:  Yes     Tommy Medal 05/03/2020, 12:06 PM

## 2020-05-03 NOTE — Discharge Instructions (Signed)
1) you have been found to have a pelvic/ovarian mass--- he need to follow-up with the gynecological oncologist-- Dr. Denman George or Dr. Berline Lopes at [facility] gynecological oncology department -next Wednesday 05/11/20--- Please call [phone] tomorrow to confirm the time for your appointment -It is very important that you keep this appointment in order to have further work-up of this pelvic mass to determine if it is cancer and to discuss further diagnostic and treatment options for rate  2)Avoid ibuprofen/Advil/Aleve/Motrin/Goody Powders/Naproxen/BC powders/Meloxicam/Diclofenac/Indomethacin and other Nonsteroidal anti-inflammatory medications as these will make you more likely to bleed and can cause stomach ulcers, can also cause Kidney problems.

## 2020-05-03 NOTE — Progress Notes (Signed)
Patient given discharge instructions with understanding stated. 

## 2020-05-04 ENCOUNTER — Telehealth: Payer: Self-pay | Admitting: *Deleted

## 2020-05-04 NOTE — Telephone Encounter (Signed)
Patient's daughter called and scheduled a new patient appt for her mother. Patient scheduled on 11/17 at 9:45 am with Dr Denman George. Patient's daughter given the address and phone number for the clinic. Patient's daughter also given the policy for mask and visitors.

## 2020-05-07 LAB — CULTURE, BLOOD (ROUTINE X 2)
Culture: NO GROWTH
Culture: NO GROWTH
Special Requests: ADEQUATE

## 2020-05-11 ENCOUNTER — Inpatient Hospital Stay: Payer: 59 | Attending: Gynecologic Oncology | Admitting: Gynecologic Oncology

## 2020-05-11 ENCOUNTER — Encounter: Payer: Self-pay | Admitting: Gynecologic Oncology

## 2020-05-11 ENCOUNTER — Other Ambulatory Visit: Payer: Self-pay

## 2020-05-11 VITALS — BP 163/67 | HR 74 | Temp 96.8°F | Resp 20 | Ht 64.0 in | Wt 212.0 lb

## 2020-05-11 DIAGNOSIS — Z6836 Body mass index (BMI) 36.0-36.9, adult: Secondary | ICD-10-CM | POA: Insufficient documentation

## 2020-05-11 DIAGNOSIS — N83209 Unspecified ovarian cyst, unspecified side: Secondary | ICD-10-CM | POA: Insufficient documentation

## 2020-05-11 DIAGNOSIS — Z8673 Personal history of transient ischemic attack (TIA), and cerebral infarction without residual deficits: Secondary | ICD-10-CM | POA: Insufficient documentation

## 2020-05-11 DIAGNOSIS — Z85528 Personal history of other malignant neoplasm of kidney: Secondary | ICD-10-CM | POA: Insufficient documentation

## 2020-05-11 DIAGNOSIS — Z853 Personal history of malignant neoplasm of breast: Secondary | ICD-10-CM | POA: Diagnosis not present

## 2020-05-11 DIAGNOSIS — E1142 Type 2 diabetes mellitus with diabetic polyneuropathy: Secondary | ICD-10-CM | POA: Diagnosis not present

## 2020-05-11 DIAGNOSIS — E785 Hyperlipidemia, unspecified: Secondary | ICD-10-CM | POA: Insufficient documentation

## 2020-05-11 DIAGNOSIS — I10 Essential (primary) hypertension: Secondary | ICD-10-CM | POA: Diagnosis not present

## 2020-05-11 DIAGNOSIS — E669 Obesity, unspecified: Secondary | ICD-10-CM | POA: Insufficient documentation

## 2020-05-11 DIAGNOSIS — Z801 Family history of malignant neoplasm of trachea, bronchus and lung: Secondary | ICD-10-CM | POA: Diagnosis not present

## 2020-05-11 DIAGNOSIS — K5909 Other constipation: Secondary | ICD-10-CM | POA: Insufficient documentation

## 2020-05-11 DIAGNOSIS — Z90710 Acquired absence of both cervix and uterus: Secondary | ICD-10-CM | POA: Insufficient documentation

## 2020-05-11 DIAGNOSIS — Z79899 Other long term (current) drug therapy: Secondary | ICD-10-CM | POA: Diagnosis not present

## 2020-05-11 DIAGNOSIS — Z794 Long term (current) use of insulin: Secondary | ICD-10-CM | POA: Diagnosis not present

## 2020-05-11 MED ORDER — TRAMADOL HCL 50 MG PO TABS: 50.0000 mg | ORAL_TABLET | Freq: Four times a day (QID) | ORAL | 0 refills | Status: DC | PRN

## 2020-05-11 MED ORDER — SENNOSIDES-DOCUSATE SODIUM 8.6-50 MG PO TABS: 2.0000 | ORAL_TABLET | Freq: Every day | ORAL | 0 refills | Status: DC

## 2020-05-11 NOTE — Patient Instructions (Signed)
Preparing for your Surgery  Plan for surgery on June 07, 2020 with Dr. Everitt Amber at [place] will be scheduled for a robotic assisted bilateral salpingo-oophorectomy (removal of both fallopian tubes and ovaries) with a mini laparotomy (larger incision on your abdomen).   Pre-operative Testing -You will receive a phone call from presurgical testing at [facility] to arrange for a pre-operative appointment, lab appointment, and COVID test. The COVID test normally happens 3 days prior to the surgery and they ask that you self quarantine after the test up until surgery to decrease chance of exposure.  -Bring your insurance card, copy of an advanced directive if applicable, medication list  -At that visit, you will be asked to sign a consent for a possible blood transfusion in case a transfusion becomes necessary during surgery.  The need for a blood transfusion is rare but having consent is a necessary part of your care.     -You will need to be off aspirin 10 days before surgery (Last dose on May 27, 2020). AFTER surgery, you will need to be off your aspirin for 7 days.  -Do not take supplements such as fish oil (omega 3), red yeast rice, turmeric before your surgery.   Day Before Surgery at [place] will be asked to take in a light diet the day before surgery. You will be advised you can have clear liquids up until 3 hours before your surgery.    Eat a light diet the day before surgery.  Examples including soups, broths, toast, yogurt, mashed potatoes.  AVOID GAS PRODUCING FOODS. Things to avoid include carbonated beverages (fizzy beverages), raw fruits and raw vegetables, or beans.   If your bowels are filled with gas, your surgeon will have difficulty visualizing your pelvic organs which increases your surgical risks.  Starting at 2pm the day before surgery, begin drinking two bottles of magnesium citrate (drink both over 2 hours) and only take in clear liquids  after that time.  Your role in recovery Your role is to become active as soon as directed by your doctor, while still giving yourself time to heal.  Rest when you feel tired. You will be asked to do the following in order to speed your recovery:  - Cough and breathe deeply. This helps to clear and expand your lungs and can prevent pneumonia after surgery.  - Amber. Do mild physical activity. Walking or moving your legs help your circulation and body functions return to normal. Do not try to get up or walk alone the first time after surgery.   -If you develop swelling on one leg or the other, pain in the back of your leg, redness/warmth in one of your legs, please call the office or go to the Emergency Room to have a doppler to rule out a blood clot. For shortness of breath, chest pain-seek care in the Emergency Room as soon as possible. - Actively manage your pain. Managing your pain lets you move in comfort. We will ask you to rate your pain on a scale of zero to 10. It is your responsibility to tell your doctor or nurse where and how much you hurt so your pain can be treated.  Special Considerations -If you are diabetic, you may be placed on insulin after surgery to have closer control over your blood sugars to promote healing and recovery.  This does not mean that you will be discharged on insulin.  If  applicable, your oral antidiabetics will be resumed when you are tolerating a solid diet.  -Your final pathology results from surgery should be available around one week after surgery and the results will be relayed to you when available.  -Dr. Lahoma Crocker is the surgeon that assists your GYN Oncologist with surgery.  If you end up staying the night, the next day after your surgery you will either see Dr. Denman George, Dr. Berline Lopes, or Dr. Lahoma Crocker.  -FMLA forms can be faxed to [phone] and please allow 5-7 business days for completion.  Pain Management After  Surgery -You have been prescribed your pain medication and bowel regimen medications before surgery so that you can have these available when you are discharged from the hospital. The pain medication is for use ONLY AFTER surgery and a new prescription will not be given.   -Make sure that you have Tylenol at home to use on a regular basis after surgery for pain control.   -Review the attached handout on narcotic use and their risks and side effects.   Bowel Regimen -You have been prescribed Sennakot-S to take nightly to prevent constipation especially if you are taking the narcotic pain medication intermittently.  It is important to prevent constipation and drink adequate amounts of liquids. You can stop taking this medication when you are not taking pain medication and you are back on your normal bowel routine.  Risks of Surgery Risks of surgery are low but include bleeding, infection, damage to surrounding structures, re-operation, blood clots, and very rarely death.   Blood Transfusion Information (For the consent to be signed before surgery)  We will be checking your blood type before surgery so in case of emergencies, we will know what type of blood you would need.                                            WHAT IS A BLOOD TRANSFUSION?  A transfusion is the replacement of blood or some of its parts. Blood is made up of multiple cells which provide different functions.  Red blood cells carry oxygen and are used for blood loss replacement.  White blood cells fight against infection.  Platelets control bleeding.  Plasma helps clot blood.  Other blood products are available for specialized needs, such as hemophilia or other clotting disorders. BEFORE THE TRANSFUSION  Who gives blood for transfusions?   You may be able to donate blood to be used at a later date on yourself (autologous donation).  Relatives can be asked to donate blood. This is generally not any safer than if you  have received blood from a stranger. The same precautions are taken to ensure safety when a relative's blood is donated.  Healthy volunteers who are fully evaluated to make sure their blood is safe. This is blood bank blood. Transfusion therapy is the safest it has ever been in the practice of medicine. Before blood is taken from a donor, a complete history is taken to make sure that person has no history of diseases nor engages in risky social behavior (examples are intravenous drug use or sexual activity with multiple partners). The donor's travel history is screened to minimize risk of transmitting infections, such as malaria. The donated blood is tested for signs of infectious diseases, such as HIV and hepatitis. The blood is then tested to be sure it is compatible  with you in order to minimize the chance of a transfusion reaction. If you or a relative donates blood, this is often done in anticipation of surgery and is not appropriate for emergency situations. It takes many days to process the donated blood. RISKS AND COMPLICATIONS Although transfusion therapy is very safe and saves many lives, the main dangers of transfusion include:   Getting an infectious disease.  Developing a transfusion reaction. This is an allergic reaction to something in the blood you were given. Every precaution is taken to prevent this. The decision to have a blood transfusion has been considered carefully by your caregiver before blood is given. Blood is not given unless the benefits outweigh the risks.  AFTER SURGERY INSTRUCTIONS  Return to work: 4 weeks if applicable  Activity: 1. Be up and out of the bed during the day.  Take a nap if needed.  You may walk up steps but be careful and use the hand rail.  Stair climbing will tire you more than you think, you may need to stop part way and rest.   2. No lifting or straining for 6 weeks over 10 pounds. No pushing, pulling, straining for 6 weeks.  3. No driving for  2 week(s) if you were cleared to drive before surgery.  Do not drive if you are taking narcotic pain medicine and make sure that your reaction time has returned.   4. You can shower as soon as the next day after surgery. Shower daily.  Use your regular soap and water (not directly on the incision) and pat your incision(s) dry afterwards; don't rub.  No tub baths or submerging your body in water until cleared by your surgeon. If you have the soap that was given to you by pre-surgical testing that was used before surgery, you do not need to use it afterwards because this can irritate your incisions.   5. No sexual activity and nothing in the vagina for 2 weeks.  6. You may experience a small amount of clear drainage from your incisions, which is normal.  If the drainage persists, increases, or changes color please call the office.  7. Do not use creams, lotions, or ointments such as neosporin on your incisions after surgery until advised by your surgeon because they can cause removal of the dermabond glue on your incisions.    9. Take Tylenol first for pain and only use narcotic pain medication for severe pain not relieved by the Tylenol.  Monitor your Tylenol intake to a max of 4,000 mg in a 24 hour period.   Diet: 1. Low sodium Heart Healthy Diet is recommended but you are cleared to resume your normal (before surgery) diet after your procedure.  2. It is safe to use a laxative, such as Miralax or Colace, if you have difficulty moving your bowels. You have been prescribed Sennakot at bedtime every evening to keep bowel movements regular and to prevent constipation.    Wound Care: 1. Keep clean and dry.  Shower daily.  Reasons to call the Doctor:  Fever - Oral temperature greater than 100.4 degrees Fahrenheit  Foul-smelling vaginal discharge  Difficulty urinating  Nausea and vomiting  Increased pain at the site of the incision that is unrelieved with pain medicine.  Difficulty  breathing with or without chest pain  New calf pain especially if only on one side  Sudden, continuing increased vaginal bleeding with or without clots.   Contacts: For questions or concerns you should contact:  Dr. Everitt Amber at [phone]  Joylene John, NP at [phone]  After Hours: call [phone] and have the GYN Oncologist paged/contacted (after 5 pm or on the weekends)

## 2020-05-11 NOTE — Progress Notes (Signed)
Consult Note: Gyn-Onc  Consult was requested by Dr. Elonda Husky for the evaluation of Jean Watts 79 y.o. female  CC:  Chief Complaint  Patient presents with  . Ovarian Cyst    Assessment/Plan:  [name]  is a 79 y.o.  year old with a 16cm ovarian cyst associated with normal Ca1 25 and no symptoms.  I have a low suspicion that this is malignant.  I offered the patient either expectant management versus surgical removal.  She elected for surgery.  Surgery would require robotic assisted BSO with mini laparotomy for cyst decompression.  Given how deep the cyst is relative to the abdominal wall, cyst decompression may be challenging.  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  She had a event monitor implanted that failed to detect arrhythmias.  Previous notes from her cardiology team indicate that her aspirin usage is for stroke prevention and not for cardiac reasons.  I am recommending she stop her aspirin 10 days preoperatively and hold 7 days postoperatively.  I explained to the patient and her daughter that this increases her risk for a vascular event perioperatively, however remaining on aspirin would substantially increase her risk for bleeding complications.  I explained other risks of surgery including  bleeding, infection, damage to internal organs (such as bladder,ureters, bowels), blood clot, reoperation and rehospitalization. Her obesity with a BMI of 36 kg per metered squared and her diabetes mellitus mean that she is at increased risk for these complications.  Surgery has been scheduled for mid December 2021.  She will use magnesium citrate preoperatively to clear her colon of the large stool burden that was seen on CT imaging as the patient reports a chronic history of constipation.   HPI: Ms Jean Watts is a 79 year old P1 who was seen in consultation at the  request of Dr Elonda Husky for evaluation of a 16cm cystic pelvic mass.  The patient was admitted to [facility] on 04/30/2020 for pyelonephritis and urosepsis.  As part of her work-up she received a CT scan of the abdomen and pelvis on 05/03/2020 which revealed mild bilateral hydronephrosis likely secondary to compression from a distal pelvic mass.  There was a 16 x 13 x 11 cm predominantly cystic lesion with probable areas of septation in the pelvis.  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  Pelvic ultrasound scan was performed on 05/01/2020.  It demonstrated a surgically absent uterus.  There was a 14.8 x 13.4 x 11 cm complex mass noted in the pelvis.  There are multiple septations and probable solid components in the pelvis.  Ca1 25 was drawn on 05/02/2020 and was normal at 6.  Hemoglobin A1c was drawn on 04/30/2020 and was mildly elevated at 7.  The patient denied symptoms from the mass.  She denied abdominal pain, urinary frequency, or pelvic pressure.  Her medical history is most significant for diabetes mellitus for which she takes insulin.  She has ocular changes from this.  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  She had a cardiac event monitor that placed internally in February 2021 by Dr Rayann Heman which failed to demonstrate an arrhythmia around the time of her strokes.  She does not have a history of atrial fibrillation.  Her surgical history is most significant for an abdominal hysterectomy for menorrhagia.  Her gynecologic history is remarkable for 1 prior SVD.  Her family cancer history is her daughter had kidney cancer and breast cancer at age 68, her mother had stomach cancer and was a non-smoker, her brother had lung cancer was a non-smoker.  She does not work outside of the house. She lives with her daughter.   Current Meds:  Outpatient Encounter  Medications as of 05/11/2020  Medication Sig  . ACCU-CHEK AVIVA PLUS test strip   . acetaminophen (TYLENOL) 325 MG tablet Take 2 tablets (650 mg total) by mouth every 4 (four) hours as needed for headache.  . albuterol (PROVENTIL) (2.5 MG/3ML) 0.083% nebulizer solution Inhale 2.5 mg into the lungs every 6 (six) hours as needed for wheezing or shortness of breath.   Marland Kitchen amitriptyline (ELAVIL) 50 MG tablet Take 50 mg by mouth at bedtime.   Marland Kitchen amLODipine-benazepril (LOTREL) 5-10 MG capsule Take 1 capsule by mouth daily.  Marland Kitchen aspirin 325 MG tablet Take 1 tablet (325 mg total) by mouth daily with breakfast.  . atorvastatin (LIPITOR) 40 MG tablet Take 1 tablet (40 mg total) by mouth daily at 6 PM.  . BD INSULIN SYRINGE U/F 31G X 5/16" 1 ML MISC 2 (two) times daily. as directed  . furosemide (LASIX) 20 MG tablet Take 20 mg by mouth as needed.  . gabapentin (NEURONTIN) 300 MG capsule Take 300 mg by mouth at bedtime.  Marland Kitchen HUMULIN N 100 UNIT/ML injection Inject 0.15-0.35 mLs (15-35 Units total) into the skin See admin instructions. Inject 35 units into the skin in the morning and 15 units in the evening  . meclizine (ANTIVERT) 25 MG tablet Take 25 mg by mouth at bedtime.   . metFORMIN (GLUCOPHAGE) 1000 MG tablet Take 1,000 mg by mouth 2 (two) times daily.   . metoprolol tartrate (LOPRESSOR) 25 MG tablet Take 25 mg by mouth 2 (two) times daily.   . RESTASIS 0.05 % ophthalmic emulsion 1 drop 2 (two) times daily.  . traMADol (ULTRAM) 50 MG tablet Take 1 tablet (50 mg total) by mouth every 6 (six) hours as needed for moderate pain.   No facility-administered encounter medications on file as of 05/11/2020.    Allergy: No Known Allergies  Social Hx:   Social History   Socioeconomic History  . Marital status: Widowed    Spouse name: Not on file  . Number of children: Not on file  . Years of education: Not on file  . Highest education level: Not on file  Occupational History  . Not on file  Tobacco Use  .  Smoking status: Never Smoker  . Smokeless tobacco: Never Used  Vaping Use  . Vaping Use: Never used  Substance and Sexual Activity  . Alcohol use: Never  . Drug use: Never  . Sexual activity: Not Currently  Other Topics Concern  . Not on file  Social History Narrative  . Not on file   Social Determinants of Health   Financial Resource Strain:   . Difficulty of Paying Living Expenses: Not on file  Food Insecurity:   . Worried About Charity fundraiser in the Last Year: Not on file  . Ran Out of Food in the Last Year: Not on file  Transportation Needs:   . Lack of Transportation (Medical): Not on file  . Lack of Transportation (Non-Medical): Not on file  Physical Activity:   . Days of Exercise per Week:  Not on file  . Minutes of Exercise per Session: Not on file  Stress:   . Feeling of Stress : Not on file  Social Connections:   . Frequency of Communication with Friends and Family: Not on file  . Frequency of Social Gatherings with Friends and Family: Not on file  . Attends Religious Services: Not on file  . Active Member of Clubs or Organizations: Not on file  . Attends Archivist Meetings: Not on file  . Marital Status: Not on file  Intimate Partner Violence:   . Fear of Current or Ex-Partner: Not on file  . Emotionally Abused: Not on file  . Physically Abused: Not on file  . Sexually Abused: Not on file    Past Surgical Hx:  Past Surgical History:  Procedure Laterality Date  . ABDOMINAL HYSTERECTOMY    . BREAST BIOPSY  02/2019  . BREAST LUMPECTOMY WITH RADIOACTIVE SEED LOCALIZATION Right 12/22/2019   Procedure: RIGHT BREAST LUMPECTOMY X 2 WITH RADIOACTIVE SEED LOCALIZATION;  Surgeon: Coralie Keens, MD;  Location: [facility];  Service: General;  Laterality: Right;  . FLEXIBLE SIGMOIDOSCOPY N/A 06/04/2019   Procedure: FLEXIBLE SIGMOIDOSCOPY;  Surgeon: Daneil Dolin, MD;  [facility];  Service: Endoscopy;  Laterality: N/A;   incomplete colonoscopy, poor prep  . IR CT HEAD LTD  08/04/2019  . IR PERCUTANEOUS ART THROMBECTOMY/INFUSION INTRACRANIAL INC DIAG ANGIO  08/04/2019  . LOOP RECORDER INSERTION N/A 08/06/2019   Procedure: LOOP RECORDER INSERTION;  Surgeon: Thompson Grayer, MD;  Location: [facility];  Service: Cardiovascular;  Laterality: N/A;  . RADIOLOGY WITH ANESTHESIA N/A 08/04/2019   Procedure: IR WITH ANESTHESIA;  Surgeon: Radiologist, Medication, MD;  Location: [facility];  Service: Radiology;  Laterality: N/A;    Past Medical Hx:  Past Medical History:  Diagnosis Date  . Dyspnea   . HLD (hyperlipidemia)   . HTN (hypertension)   . Neuropathy   . Stroke [facility])    no residual   . 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 Visual loss    right eye    Past Gynecological History:  See HPI No LMP recorded. Patient has had a hysterectomy.  Family Hx:  Family History  Problem Relation Age of Onset  . Cancer Mother   . Heart attack Father   . Prostate cancer Brother   . Lung cancer Brother   . Cancer Daughter 5       kidney  . Breast cancer Daughter 41  . Colon cancer Neg Hx   . Colon polyps Neg Hx     Review of Systems:  Constitutional  Feels well,    ENT Normal appearing ears and nares bilaterally Skin/Breast  No rash, sores, jaundice, itching, dryness Cardiovascular  No chest pain, shortness of breath, or edema  Pulmonary  No cough or wheeze.  Gastro Intestinal  No nausea, vomitting, or diarrhoea. No bright red blood per rectum, no abdominal pain, change in bowel movement, + constipation.  Genito Urinary  No frequency, urgency, dysuria,  Musculo Skeletal  No myalgia, arthralgia, joint swelling or pain  Neurologic  No weakness, numbness, change in gait,  Psychology  No depression, anxiety, insomnia.   Vitals:  Blood pressure (!) 163/67, pulse 74, temperature (!) 96.8 F (36 C), temperature source Tympanic, resp. rate 20, height 5\' 4"  (1.626 m), weight 212 lb  (96.2 kg), SpO2 100 %.  Physical Exam: WD in NAD Neck  Supple NROM, without  any enlargements.  Lymph Node Survey No cervical supraclavicular or inguinal adenopathy Cardiovascular  Pulse normal rate, regularity and rhythm. S1 and S2 normal.  Lungs  Clear to auscultation bilateraly, without wheezes/crackles/rhonchi. Good air movement.  Skin  No rash/lesions/breakdown  Psychiatry  Alert and oriented to person, place, and time  Abdomen  Normoactive bowel sounds, abdomen soft, non-tender and obese without evidence of hernia. Fullness in lower abdomen consistent with cystic mass Back No CVA tenderness Genito Urinary  Vulva/vagina: Normal external female genitalia.  No lesions. No discharge or bleeding.  Bladder/urethra:  No lesions or masses, well supported bladder  Vagina: normal, smooth  Cervix and uterus surgically absent  Adnexa: smooth firm mass abutting the vaginal cuff, not mobile. Rectal  deferred Extremities  No bilateral cyanosis, clubbing or edema.  60 minutes of total time was spent for this patient encounter, including preparation, face-to-face counseling with the patient and coordination of care, review of imaging (results and images), communication with the referring provider and documentation of the encounter.   Thereasa Solo, MD  05/11/2020, 10:18 AM

## 2020-05-12 ENCOUNTER — Other Ambulatory Visit: Payer: Self-pay | Admitting: Gynecologic Oncology

## 2020-05-12 DIAGNOSIS — N83209 Unspecified ovarian cyst, unspecified side: Secondary | ICD-10-CM

## 2020-05-30 NOTE — Progress Notes (Addendum)
COVID Vaccine Completed:  No Date COVID Vaccine completed: COVID vaccine manufacturer: [place]   PCP - Rosita Fire, MD Cardiologist -  Bronson Ing, MD  Chest x-ray - 08-06-19 in Epic EKG - 08-05-19 in Epic Stress Test -  ECHO - 08-05-19 in Epic Cardiac Cath -  Pacemaker/ICD device last checked:  Implantable loop recorder - last check 04-30-20 in Epic  Sleep Study - N/A CPAP -   Fasting Blood Sugar - 85 to 166 Checks Blood Sugar - 2 times a day  Blood Thinner Instructions: Aspirin Instructions:  ASA 325 mg, on for stroke prevention.  Patient will check with neurologist and Dr. Denman George regarding the ASA.  Phoned patient's daughter and discussed this with her as well, and she stated that she would call and check on Aspirin. Last Dose:  Anesthesia review:  Implantable loop recorder.   Hx of stroke 07-2019, states no deficits from stroke.    Patient denies shortness of breath, fever, cough and chest pain at PAT appointment.  Patient states that she can climb a flight of stairs but very slowly due to leg weakness.  She says that she can perform her ADLs independently at times depending on how she feels, her daughter helps her when she does not feel well.   Patient verbalized understanding of instructions that were given to them at the PAT appointment. Patient was also instructed that they will need to review over the PAT instructions again at home before surgery.

## 2020-05-31 NOTE — Patient Instructions (Addendum)
DUE TO COVID-19 ONLY ONE VISITOR IS ALLOWED TO COME WITH YOU AND STAY IN THE WAITING ROOM ONLY DURING PRE OP AND PROCEDURE.   IF YOU WILL BE ADMITTED INTO THE HOSPITAL YOU ARE ALLOWED ONE SUPPORT PERSON DURING VISITATION HOURS ONLY (10AM -8PM)   . The support person may change daily. . The support person must pass our screening, gel in and out, and wear a mask at all times, including in the patient's room. . Patients must also wear a mask when staff or their support person are in the room.   COVID SWAB TESTING MUST BE COMPLETED ON:   Friday, 06-03-20 @ 10:10 AM   [address] [place], [place] [address]  (Must self quarantine after testing. Follow instructions on handout.)        Your procedure is scheduled on:  Tuesday, 06-07-20   Report to [facility] Main  Entrance   Report to Short Stay at 5:30 AM   [facility])    Call this number if you have problems the morning of surgery [phone]   Light diet the am of the morning before. To start drinking two bottles of magnesium citrate at 2 pm the day before surgery and take in  liquids after that time. Clear liquids up until three hours prior to scheduled surgery    Do not eat food :After Midnight.   May have liquids until 4:30 AM day of surgery  CLEAR LIQUID DIET  Foods Allowed                                                                     Foods Excluded  Water, Black Coffee and tea, regular and decaf              liquids that you cannot  Plain Jell-O in any flavor  (No red)                                     see through such as: Fruit ices (not with fruit pulp)                                      milk, soups, orange juice              Iced Popsicles (No red)                                      All solid food                                   Apple juices Sports drinks like Gatorade (No red) Lightly seasoned clear broth or consume(fat free) Sugar, honey syrup  Sample Menu Breakfast                                 Lunch  Supper Cranberry juice                    Beef broth                            Chicken broth Jell-O                                     Grape juice                           Apple juice Coffee or tea                        Jell-O                                      Popsicle                                                Coffee or tea                        Coffee or tea       Oral Hygiene is also important to reduce your risk of infection.                                    Remember - BRUSH YOUR TEETH THE MORNING OF SURGERY WITH YOUR REGULAR TOOTHPASTE   Do NOT smoke after Midnight   Take these medicines the morning of surgery with A SIP OF WATER:  Metoprolol, Gabapentin    How to Manage Your Diabetes  Before and After Surgery  Why is it important to control my blood sugar before and after surgery? . Improving blood sugar levels before and after surgery helps healing and can limit problems. . A way of improving blood sugar control is eating a healthy diet by: o  Eating less sugar and carbohydrates o  Increasing activity/exercise o  Talking with your doctor about reaching your blood sugar goals . High blood sugars (greater than 180 mg/dL) can raise your risk of infections and slow your recovery, so you will need to focus on controlling your diabetes during the weeks before surgery. . Make sure that the doctor who takes care of your diabetes knows about your planned surgery including the date and location.  How do I manage my blood sugar before surgery? . Check your blood sugar at least 4 times a day, starting 2 days before surgery, to make sure that the level is not too high or low. o Check your blood sugar the morning of your surgery when you wake up and every 2 hours until you get to the Short Stay unit. . If your blood sugar is less than 70 mg/dL, you will need to treat for low blood sugar: o Do not take insulin. o Treat a low blood  sugar (less than 70 mg/dL) with  cup of clear juice (cranberry or apple), 4 glucose tablets, OR glucose gel. o Recheck  blood sugar in 15 minutes after treatment (to make sure it is greater than 70 mg/dL). If your blood sugar is not greater than 70 mg/dL on recheck, call [phone] for further instructions. . Report your blood sugar to the short stay nurse when you get to Short Stay.  . If you are admitted to the hospital after surgery: o Your blood sugar will be checked by the staff and you will probably be given insulin after surgery (instead of oral diabetes medicines) to make sure you have good blood sugar levels. o The goal for blood sugar control after surgery is 80-180 mg/dL.   WHAT DO I DO ABOUT MY DIABETES MEDICATION?  Marland Kitchen Do not take oral diabetes medicines (pills) the morning of surgery.  . THE DAY BEFORE SURGERY:  Take Metformin as prescribed.             Take 40 units of Humulin N the morning          Take 10 units of Humulin N evening   . THE MORNING OF SURGERY:  Do not take Metformin               Take 20 units of Humulin N.   Reviewed and Endorsed by [facility] Patient Education Committee, August 2015                               You may not have any metal on your body including hair pins, jewelry, and body piercings             Do not wear make-up, lotions, powders, perfumes/cologne, or deodorant             Do not wear nail polish.  Do not shave  48 hours prior to surgery.               Do not bring valuables to the hospital. [place].   Contacts, dentures or bridgework may not be worn into surgery.   Patients discharged the day of surgery will not be allowed to drive home.   Special Instructions: Bring a copy of your healthcare power of attorney and living will documents         the day of surgery if you haven't scanned them in before.              Please read over the following fact sheets you were given: IF YOU HAVE  QUESTIONS ABOUT YOUR PRE OP INSTRUCTIONS PLEASE CALL [phone]   [place] - Preparing for Surgery Before surgery, you can play an important role.  Because skin is not sterile, your skin needs to be as free of germs as possible.  You can reduce the number of germs on your skin by washing with CHG (chlorahexidine gluconate) soap before surgery.  CHG is an antiseptic cleaner which kills germs and bonds with the skin to continue killing germs even after washing. Please DO NOT use if you have an allergy to CHG or antibacterial soaps.  If your skin becomes reddened/irritated stop using the CHG and inform your nurse when you arrive at Short Stay. Do not shave (including legs and underarms) for at least 48 hours prior to the first CHG shower.  You may shave your face/neck.  Please follow these instructions carefully:  1.  Shower with CHG Soap the night before surgery and the  morning of surgery.  2.  If you choose to wash your hair, wash your hair first as usual with your normal  shampoo.  3.  After you shampoo, rinse your hair and body thoroughly to remove the shampoo.                             4.  Use CHG as you would any other liquid soap.  You can apply chg directly to the skin and wash.  Gently with a scrungie or clean washcloth.  5.  Apply the CHG Soap to your body ONLY FROM THE NECK DOWN.   Do   not use on face/ open                           Wound or open sores. Avoid contact with eyes, ears mouth and   genitals (private parts).                       Wash face,  Genitals (private parts) with your normal soap.             6.  Wash thoroughly, paying special attention to the area where your    surgery  will be performed.  7.  Thoroughly rinse your body with warm water from the neck down.  8.  DO NOT shower/wash with your normal soap after using and rinsing off the CHG Soap.                9.  Pat yourself dry with a clean towel.            10.  Wear clean pajamas.            11.  Place  clean sheets on your bed the night of your first shower and do not  sleep with pets. Day of Surgery : Do not apply any lotions/deodorants the morning of surgery.  Please wear clean clothes to the hospital/surgery center.  FAILURE TO FOLLOW THESE INSTRUCTIONS MAY RESULT IN THE CANCELLATION OF YOUR SURGERY  PATIENT SIGNATURE_________________________________  NURSE SIGNATURE__________________________________  ________________________________________________________________________  WHAT IS A BLOOD TRANSFUSION? Blood Transfusion Information  A transfusion is the replacement of blood or some of its parts. Blood is made up of multiple cells which provide different functions.  Red blood cells carry oxygen and are used for blood loss replacement.  White blood cells fight against infection.  Platelets control bleeding.  Plasma helps clot blood.  Other blood products are available for specialized needs, such as hemophilia or other clotting disorders. BEFORE THE TRANSFUSION  Who gives blood for transfusions?   Healthy volunteers who are fully evaluated to make sure their blood is safe. This is blood bank blood. Transfusion therapy is the safest it has ever been in the practice of medicine. Before blood is taken from a donor, a complete history is taken to make sure that person has no history of diseases nor engages in risky social behavior (examples are intravenous drug use or sexual activity with multiple partners). The donor's travel history is screened to minimize risk of transmitting infections, such as malaria. The donated blood is tested for signs of infectious diseases, such as HIV and hepatitis. The blood is then tested to be sure it is compatible with you in order to minimize the chance of a transfusion reaction. If you or a relative donates blood, this is often done in anticipation of surgery  and is not appropriate for emergency situations. It takes many days to process the donated  blood. RISKS AND COMPLICATIONS Although transfusion therapy is very safe and saves many lives, the main dangers of transfusion include:   Getting an infectious disease.  Developing a transfusion reaction. This is an allergic reaction to something in the blood you were given. Every precaution is taken to prevent this. The decision to have a blood transfusion has been considered carefully by your caregiver before blood is given. Blood is not given unless the benefits outweigh the risks. AFTER THE TRANSFUSION  Right after receiving a blood transfusion, you will usually feel much better and more energetic. This is especially true if your red blood cells have gotten low (anemic). The transfusion raises the level of the red blood cells which carry oxygen, and this usually causes an energy increase.  The nurse administering the transfusion will monitor you carefully for complications. HOME CARE INSTRUCTIONS  No special instructions are needed after a transfusion. You may find your energy is better. Speak with your caregiver about any limitations on activity for underlying diseases you may have. SEEK MEDICAL CARE IF:   Your condition is not improving after your transfusion.  You develop redness or irritation at the intravenous (IV) site. SEEK IMMEDIATE MEDICAL CARE IF:  Any of the following symptoms occur over the next 12 hours:  Shaking chills.  You have a temperature by mouth above 102 F (38.9 C), not controlled by medicine.  Chest, back, or muscle pain.  People around you feel you are not acting correctly or are confused.  Shortness of breath or difficulty breathing.  Dizziness and fainting.  You get a rash or develop hives.  You have a decrease in urine output.  Your urine turns a dark color or changes to pink, red, or brown. Any of the following symptoms occur over the next 10 days:  You have a temperature by mouth above 102 F (38.9 C), not controlled by  medicine.  Shortness of breath.  Weakness after normal activity.  The white part of the eye turns yellow (jaundice).  You have a decrease in the amount of urine or are urinating less often.  Your urine turns a dark color or changes to pink, red, or brown. Document Released: 06/08/2000 Document Revised: 09/03/2011 Document Reviewed: 01/26/2008 [facility] Patient Information 2014 [place], [place].  _______________________________________________________________________

## 2020-06-01 ENCOUNTER — Other Ambulatory Visit: Payer: Self-pay

## 2020-06-01 ENCOUNTER — Encounter (HOSPITAL_COMMUNITY)
Admission: RE | Admit: 2020-06-01 | Discharge: 2020-06-01 | Disposition: A | Discharge status: Home / Self Care | Payer: 59 | Source: Ambulatory Visit | Attending: Gynecologic Oncology | Admitting: Gynecologic Oncology

## 2020-06-01 ENCOUNTER — Encounter (HOSPITAL_COMMUNITY): Payer: Self-pay

## 2020-06-01 DIAGNOSIS — Z01812 Encounter for preprocedural laboratory examination: Secondary | ICD-10-CM | POA: Diagnosis present

## 2020-06-01 HISTORY — DX: Headache, unspecified: R51.9

## 2020-06-01 HISTORY — DX: Unspecified osteoarthritis, unspecified site: M19.90

## 2020-06-01 LAB — URINALYSIS, ROUTINE W REFLEX MICROSCOPIC
Bilirubin Urine: NEGATIVE
Glucose, UA: NEGATIVE mg/dL
Hgb urine dipstick: NEGATIVE
Ketones, ur: NEGATIVE mg/dL
Leukocytes,Ua: NEGATIVE
Nitrite: NEGATIVE
Protein, ur: NEGATIVE mg/dL
Specific Gravity, Urine: 1.01 (ref 1.005–1.030)
pH: 7 (ref 5.0–8.0)

## 2020-06-01 LAB — COMPREHENSIVE METABOLIC PANEL
ALT: 10 U/L (ref 0–44)
AST: 15 U/L (ref 15–41)
Albumin: 3.7 g/dL (ref 3.5–5.0)
Alkaline Phosphatase: 108 U/L (ref 38–126)
Anion gap: 11 (ref 5–15)
BUN: 7 mg/dL — ABNORMAL LOW (ref 8–23)
CO2: 20 mmol/L — ABNORMAL LOW (ref 22–32)
Calcium: 9.1 mg/dL (ref 8.9–10.3)
Chloride: 108 mmol/L (ref 98–111)
Creatinine, Ser: 0.81 mg/dL (ref 0.44–1.00)
GFR, Estimated: >60 mL/min (ref >60)
Glucose, Bld: 146 mg/dL — ABNORMAL HIGH (ref 70–99)
Potassium: 4.4 mmol/L (ref 3.5–5.1)
Sodium: 139 mmol/L (ref 135–145)
Total Bilirubin: 0.6 mg/dL (ref 0.3–1.2)
Total Protein: 6.9 g/dL (ref 6.5–8.1)

## 2020-06-01 LAB — GLUCOSE, CAPILLARY
Glucose-Capillary: 65 mg/dL — ABNORMAL LOW (ref 70–99)
Glucose-Capillary: 137 mg/dL — ABNORMAL HIGH (ref 70–99)

## 2020-06-01 LAB — CBC
HCT: 32.4 % — ABNORMAL LOW (ref 36.0–46.0)
Hemoglobin: 10.3 g/dL — ABNORMAL LOW (ref 12.0–15.0)
MCH: 27.1 pg (ref 26.0–34.0)
MCHC: 31.8 g/dL (ref 30.0–36.0)
MCV: 85.3 fL (ref 80.0–100.0)
Platelets: 237 10*3/uL (ref 150–400)
RBC: 3.8 MIL/uL — ABNORMAL LOW (ref 3.87–5.11)
RDW: 17.7 % — ABNORMAL HIGH (ref 11.5–15.5)
WBC: 4 10*3/uL (ref 4.0–10.5)
nRBC: 0 % (ref 0.0–0.2)

## 2020-06-03 ENCOUNTER — Other Ambulatory Visit (HOSPITAL_COMMUNITY)
Admission: RE | Admit: 2020-06-03 | Discharge: 2020-06-03 | Disposition: A | Discharge status: Home / Self Care | Payer: 59 | Source: Ambulatory Visit | Attending: Gynecologic Oncology | Admitting: Gynecologic Oncology

## 2020-06-03 DIAGNOSIS — Z20822 Contact with and (suspected) exposure to covid-19: Secondary | ICD-10-CM | POA: Diagnosis not present

## 2020-06-03 DIAGNOSIS — Z01812 Encounter for preprocedural laboratory examination: Secondary | ICD-10-CM | POA: Diagnosis present

## 2020-06-03 LAB — SARS CORONAVIRUS 2 (TAT 6-24 HRS): SARS Coronavirus 2: NEGATIVE

## 2020-06-04 LAB — CUP PACEART REMOTE DEVICE CHECK
Date Time Interrogation Session: 20211210003110
Implantable Pulse Generator Implant Date: 20210211

## 2020-06-06 ENCOUNTER — Ambulatory Visit (INDEPENDENT_AMBULATORY_CARE_PROVIDER_SITE_OTHER): Payer: 59

## 2020-06-06 ENCOUNTER — Telehealth: Payer: Self-pay

## 2020-06-06 DIAGNOSIS — I63512 Cerebral infarction due to unspecified occlusion or stenosis of left middle cerebral artery: Secondary | ICD-10-CM | POA: Diagnosis not present

## 2020-06-06 NOTE — Telephone Encounter (Signed)
Daughter [name] called back and began to review pre-op instructions. [name] confirmed with her mother she  has been taking the ASA 325 mg tabs right a long and did not take a dose today. Reviewed with Dr. Denman George. Surgery is canceled for tomorrow. R/S surgery for Tuesday 07-06-19 with daughter. Told daughter that her mother's last dose of ASA will be 06-24-20 = 10 days prior to surgery. She can resume it 7 days after her surgery which will be July 13, 2020.   She can use the 2 bottles of mag-citrate that she has not used for today for prep on 07-05-19. Pre surgical testing will call the daughter 7-10 days prior to  Surgery for pre op visit and Covid test. Daughter verbalized understanding.

## 2020-06-07 DIAGNOSIS — N83209 Unspecified ovarian cyst, unspecified side: Secondary | ICD-10-CM

## 2020-06-07 LAB — TYPE AND SCREEN
ABO/RH(D): A POS
Antibody Screen: NEGATIVE

## 2020-06-20 NOTE — Progress Notes (Signed)
Carelink Summary Report / Loop Recorder 

## 2020-06-28 ENCOUNTER — Encounter: Payer: 59 | Admitting: Gynecologic Oncology

## 2020-06-28 ENCOUNTER — Telehealth: Payer: Self-pay

## 2020-06-28 NOTE — Progress Notes (Addendum)
COVID Vaccine Completed:  x2 Date COVID Vaccine completed:  Unsure of dates COVID vaccine manufacturer: Unsure of manufacturere  PCP - Avon Gully, MD Cardiologist - Purvis Sheffield, MD  Chest x-ray - 08-06-19 in Epic EKG - 08-05-19 in Epic Stress Test -  ECHO - 08-05-19 in Epic Cardiac Cath -  Pacemaker/ICD device last checked: Implantable loop recorder - last check 06-04-20 in Epic  Sleep Study - N/A CPAP -   Fasting Blood Sugar - 85 to 166 Checks Blood Sugar - 2 times a day  Blood Thinner Instructions: Aspirin Instructions:  ASA 325 mg  Last Dose:  Stopped one week ago.    Anesthesia review:  Implantable loop recorder.   Hx of stroke 07-2019, states no deficits from stroke  Patient denies shortness of breath, fever, cough and chest pain at PAT appointment.  Pt unable to climb a flight of stairs due to leg pain and weakness in legs.  Pt states that she can do light housework at times but is limited due to leg pain and weakness.     Patient verbalized understanding of instructions that were given to them at the PAT appointment. Patient was also instructed that they will need to review over the PAT instructions again at home before surgery.

## 2020-06-28 NOTE — Telephone Encounter (Addendum)
Spoke with daughter Elease Hashimoto and she said that she made sure that her mother's last doe of ASA was Friday 06-24-20. This is 10 days prior to surgery.

## 2020-06-28 NOTE — Patient Instructions (Addendum)
DUE TO COVID-19 ONLY ONE VISITOR IS ALLOWED TO COME WITH YOU AND STAY IN THE WAITING ROOM ONLY DURING PRE OP AND PROCEDURE.   IF YOU WILL BE ADMITTED INTO THE HOSPITAL YOU ARE ALLOWED ONE SUPPORT PERSON DURING VISITATION HOURS ONLY (10AM -8PM)   . The support person may change daily. . The support person must pass our screening, gel in and out, and wear a mask at all times, including in the patient's room. . Patients must also wear a mask when staff or their support person are in the room.   COVID SWAB TESTING MUST BE COMPLETED ON:   Friday, 07-01-20 @ 11:05 AM   [address] [place], [place] [address]  (Must self quarantine after testing. Follow instructions on handout.)    Your procedure is scheduled on:  Tuesday, 07-05-20   Report to [facility] Main  Entrance    Report to admitting at 8:15 AM   Call this number if you have problems the morning of surgery [phone]    Light diet day before surgery.  At 2:00 PM day before surgery drink 2 bottles of Magnesium Citrate and then do a clear liquid diet.   Do not eat food :After Midnight.   May have liquids until  7:15 AM day of surgery  CLEAR LIQUID DIET  Foods Allowed                                                                     Foods Excluded  Water, Black Coffee and tea, regular and decaf               liquids that you cannot  Plain Jell-O in any flavor  (No red)                                      see through such as: Fruit ices (not with fruit pulp)                                      milk, soups, orange juice              Iced Popsicles (No red)                                      All solid food                                   Apple juices Sports drinks like Gatorade (No red) Lightly seasoned clear broth or consume(fat free) Sugar, honey syrup  Sample Menu Breakfast                                Lunch  Supper Cranberry juice                    Beef broth                             Chicken broth Jell-O                                     Grape juice                           Apple juice Coffee or tea                        Jell-O                                      Popsicle                                                Coffee or tea                        Coffee or tea      Oral Hygiene is also important to reduce your risk of infection.                                    Remember - BRUSH YOUR TEETH THE MORNING OF SURGERY WITH YOUR REGULAR TOOTHPASTE   Do NOT smoke after Midnight   Take these medicines the morning of surgery with A SIP OF WATER:   Gabapentin  DO NOT TAKE ANY ORAL DIABETIC MEDICATIONS DAY OF YOUR SURGERY  How to Manage Your Diabetes Before and After Surgery  Why is it important to control my blood sugar before and after surgery? . Improving blood sugar levels before and after surgery helps healing and can limit problems. . A way of improving blood sugar control is eating a healthy diet by: o  Eating less sugar and carbohydrates o  Increasing activity/exercise o  Talking with your doctor about reaching your blood sugar goals . High blood sugars (greater than 180 mg/dL) can raise your risk of infections and slow your recovery, so you will need to focus on controlling your diabetes during the weeks before surgery. . Make sure that the doctor who takes care of your diabetes knows about your planned surgery including the date and location.  How do I manage my blood sugar before surgery? . Check your blood sugar at least 4 times a day, starting 2 days before surgery, to make sure that the level is not too high or low. o Check your blood sugar the morning of your surgery when you wake up and every 2 hours until you get to the Short Stay unit. . If your blood sugar is less than 70 mg/dL, you will need to treat for low blood sugar: o Do not take insulin. o Treat a low blood sugar (less than 70 mg/dL) with  cup of clear juice (cranberry  or  apple), 4 glucose tablets, OR glucose gel. o Recheck blood sugar in 15 minutes after treatment (to make sure it is greater than 70 mg/dL). If your blood sugar is not greater than 70 mg/dL on recheck, call [phone] for further instructions. . Report your blood sugar to the short stay nurse when you get to Short Stay.  . If you are admitted to the hospital after surgery: o Your blood sugar will be checked by the staff and you will probably be given insulin after surgery (instead of oral diabetes medicines) to make sure you have good blood sugar levels. o The goal for blood sugar control after surgery is 80-180 mg/dL.   WHAT DO I DO ABOUT MY DIABETES MEDICATION?  Marland Kitchen Do not take oral diabetes medicines (pills) the morning of surgery.  . THE DAY BEFORE SURGERY:  Take Metformin as prescribed..        Tale 10 units of Humulin N in the evening      . THE MORNING OF SURGERY:  Do not take Metformin.                  Take 20 units of Humulin N in the morning.    Reviewed and Endorsed by [facility] Patient Education Committee, August 2015                               You may not have any metal on your body including hair pins, jewelry, and body piercings             Do not wear make-up, lotions, powders, perfumes/cologne, or deodorant             Do not wear nail polish.  Do not shave  48 hours prior to surgery.    Do not bring valuables to the hospital. [facility] IS NOT  RESPONSIBLE   FOR VALUABLES.   Contacts, dentures or bridgework may not be worn into surgery.     Patients discharged the day of surgery will not be allowed to drive home.               Please read over the following fact sheets you were given: IF YOU HAVE QUESTIONS ABOUT YOUR PRE OP INSTRUCTIONS PLEASE CALL [phone]    - Preparing for Surgery Before surgery, you can play an important role.  Because skin is not sterile, your skin needs to be as free of germs as possible.  You can reduce the  number of germs on your skin by washing with CHG (chlorahexidine gluconate) soap before surgery.  CHG is an antiseptic cleaner which kills germs and bonds with the skin to continue killing germs even after washing. Please DO NOT use if you have an allergy to CHG or antibacterial soaps.  If your skin becomes reddened/irritated stop using the CHG and inform your nurse when you arrive at Short Stay. Do not shave (including legs and underarms) for at least 48 hours prior to the first CHG shower.  You may shave your face/neck.  Please follow these instructions carefully:  1.  Shower with CHG Soap the night before surgery and the  morning of surgery.  2.  If you choose to wash your hair, wash your hair first as usual with your normal  shampoo.  3.  After you shampoo, rinse your hair and body thoroughly to remove the shampoo.  4.  Use CHG as you would any other liquid soap.  You can apply chg directly to the skin and wash.  Gently with a scrungie or clean washcloth.  5.  Apply the CHG Soap to your body ONLY FROM THE NECK DOWN.   Do   not use on face/ open                           Wound or open sores. Avoid contact with eyes, ears mouth and   genitals (private parts).                       Wash face,  Genitals (private parts) with your normal soap.             6.  Wash thoroughly, paying special attention to the area where your    surgery  will be performed.  7.  Thoroughly rinse your body with warm water from the neck down.  8.  DO NOT shower/wash with your normal soap after using and rinsing off the CHG Soap.                9.  Pat yourself dry with a clean towel.            10.  Wear clean pajamas.            11.  Place clean sheets on your bed the night of your first shower and do not  sleep with pets. Day of Surgery : Do not apply any lotions/deodorants the morning of surgery.  Please wear clean clothes to the hospital/surgery center.  FAILURE TO FOLLOW THESE  INSTRUCTIONS MAY RESULT IN THE CANCELLATION OF YOUR SURGERY  PATIENT SIGNATURE_________________________________  NURSE SIGNATURE__________________________________  ________________________________________________________________________

## 2020-06-29 ENCOUNTER — Encounter (HOSPITAL_COMMUNITY): Payer: Self-pay

## 2020-06-29 ENCOUNTER — Encounter (HOSPITAL_COMMUNITY)
Admission: RE | Admit: 2020-06-29 | Discharge: 2020-06-29 | Disposition: A | Discharge status: Home / Self Care | Payer: 59 | Source: Ambulatory Visit | Attending: Gynecologic Oncology | Admitting: Gynecologic Oncology

## 2020-06-29 ENCOUNTER — Other Ambulatory Visit: Payer: Self-pay

## 2020-06-29 DIAGNOSIS — N83202 Unspecified ovarian cyst, left side: Secondary | ICD-10-CM | POA: Insufficient documentation

## 2020-06-29 DIAGNOSIS — Z7984 Long term (current) use of oral hypoglycemic drugs: Secondary | ICD-10-CM | POA: Diagnosis not present

## 2020-06-29 DIAGNOSIS — Z7901 Long term (current) use of anticoagulants: Secondary | ICD-10-CM | POA: Insufficient documentation

## 2020-06-29 DIAGNOSIS — E1142 Type 2 diabetes mellitus with diabetic polyneuropathy: Secondary | ICD-10-CM | POA: Diagnosis not present

## 2020-06-29 DIAGNOSIS — Z79899 Other long term (current) drug therapy: Secondary | ICD-10-CM | POA: Insufficient documentation

## 2020-06-29 DIAGNOSIS — I10 Essential (primary) hypertension: Secondary | ICD-10-CM | POA: Diagnosis not present

## 2020-06-29 DIAGNOSIS — N83201 Unspecified ovarian cyst, right side: Secondary | ICD-10-CM | POA: Insufficient documentation

## 2020-06-29 DIAGNOSIS — Z01812 Encounter for preprocedural laboratory examination: Secondary | ICD-10-CM | POA: Insufficient documentation

## 2020-06-29 DIAGNOSIS — Z7982 Long term (current) use of aspirin: Secondary | ICD-10-CM | POA: Diagnosis not present

## 2020-06-29 DIAGNOSIS — Z8673 Personal history of transient ischemic attack (TIA), and cerebral infarction without residual deficits: Secondary | ICD-10-CM | POA: Diagnosis not present

## 2020-06-29 LAB — BASIC METABOLIC PANEL
Anion gap: 9 (ref 5–15)
BUN: 12 mg/dL (ref 8–23)
CO2: 22 mmol/L (ref 22–32)
Calcium: 9.1 mg/dL (ref 8.9–10.3)
Chloride: 108 mmol/L (ref 98–111)
Creatinine, Ser: 0.92 mg/dL (ref 0.44–1.00)
GFR, Estimated: >60 mL/min (ref >60)
Glucose, Bld: 99 mg/dL (ref 70–99)
Potassium: 4.1 mmol/L (ref 3.5–5.1)
Sodium: 139 mmol/L (ref 135–145)

## 2020-06-29 LAB — CBC
HCT: 33.3 % — ABNORMAL LOW (ref 36.0–46.0)
Hemoglobin: 10.9 g/dL — ABNORMAL LOW (ref 12.0–15.0)
MCH: 27.7 pg (ref 26.0–34.0)
MCHC: 32.7 g/dL (ref 30.0–36.0)
MCV: 84.5 fL (ref 80.0–100.0)
Platelets: 245 10*3/uL (ref 150–400)
RBC: 3.94 MIL/uL (ref 3.87–5.11)
RDW: 17.2 % — ABNORMAL HIGH (ref 11.5–15.5)
WBC: 3.9 10*3/uL — ABNORMAL LOW (ref 4.0–10.5)
nRBC: 0 % (ref 0.0–0.2)

## 2020-06-29 LAB — HEMOGLOBIN A1C
Hgb A1c MFr Bld: 6.1 % — ABNORMAL HIGH (ref 4.8–5.6)
Mean Plasma Glucose: 128.37 mg/dL

## 2020-06-29 LAB — GLUCOSE, CAPILLARY: Glucose-Capillary: 96 mg/dL (ref 70–99)

## 2020-06-30 NOTE — Progress Notes (Signed)
Anesthesia Chart Review   Case: [id number] Date/Time: 07/05/20 1000   Procedure: XI ROBOTIC ASSISTED BILATERAL SALPINGO OOPHORECTOMY WITH MINI LAPAROTOMY (Bilateral )   Anesthesia type: General   Pre-op diagnosis: OVARIAN CYST   Location: [facility] / [facility]   Surgeons: Everitt Amber, MD      DISCUSSION:79 y.o. never smoker with h/o HTN, DM II, stroke, ovarian cyst scheduled for above procedure 07/05/20 with Dr. Everitt Amber.   S/p right breast lumpectomy 12/22/2019. No documented anesthesia complications.  Prior to this procedure cleared by cardiology.  Per cardiology preoperative evaluation 12/18/2019, "Chart reviewed as part of pre-operative protocol coverage. Patient was contacted 12/18/2019 in reference to pre-operative risk assessment for pending surgery as outlined below.  Jean Watts was last seen on 10/27/2019 by Dr. Bronson Ing.  Since that day, Jean Watts has done well without chest pain or shortness of breath. She has recovered from the stroke quite well. She has no prior h/o CAD. Last echo in 07/2019 showed normal EF. Recent loop recorder interrogation showed no significant arrhythmia.  Therefore, based on ACC/AHA guidelines, the patient would be at acceptable risk for the planned procedure without further cardiovascular testing."  Hospital admission 11/6-11/9/21 with sepsis with AKI due to UTI. Ovarian mass found during this admission.    Anticipate pt can proceed with planned procedure barring acute status change.   VS: BP (!) 144/65   Pulse 68   Temp 36.8 C (Oral)   Resp 16   Ht 5\' 4"  (1.626 m)   Wt 95.3 kg   SpO2 97%   BMI 36.05 kg/m   PROVIDERS: Rosita Fire, MD is PCP   Bronson Ing, MD is Cardiologist  LABS: Labs reviewed: Acceptable for surgery. (all labs ordered are listed, but only abnormal results are displayed)  Labs Reviewed  HEMOGLOBIN A1C - Abnormal; Notable for the following components:      Result Value   Hgb A1c MFr Bld 6.1 (*)    All other  components within normal limits  CBC - Abnormal; Notable for the following components:   WBC 3.9 (*)    Hemoglobin 10.9 (*)    HCT 33.3 (*)    RDW 17.2 (*)    All other components within normal limits  BASIC METABOLIC PANEL  GLUCOSE, CAPILLARY     IMAGES:   EKG: 08/05/2019 Rate 67 bpm  Sinus rhythm  Borderline LAD  CV: Echo 08/05/2019 IMPRESSIONS    1. Left ventricular ejection fraction, by estimation, is 60 to 65%. The  left ventricle has normal function. The left ventrical has no regional  wall motion abnormalities. There is mildly increased concentric left  ventricular hypertrophy. Left ventricular  diastolic parameters are consistent with Grade I diastolic dysfunction  (impaired relaxation).  2. Right ventricular systolic function is normal. The right ventricular  size is normal. There is mildly elevated pulmonary artery systolic  pressure.  3. No left atrial/left atrial appendage thrombus was detected.  4. The mitral valve is normal in structure and function. trivial mitral  valve regurgitation. No evidence of mitral stenosis.  5. The aortic valve is tricuspid. Aortic valve regurgitation is not  visualized. Mild aortic valve sclerosis is present, with no evidence of  aortic valve stenosis.  6. The inferior vena cava is normal in size with greater than 50%  respiratory variability, suggesting right atrial pressure of 3 mmHg.  Past Medical History:  Diagnosis Date  . Arthritis   . Dyspnea   . Headache   . HLD (hyperlipidemia)   .  HTN (hypertension)   . Neuropathy   . Stroke [facility])    no residual   . Type 2 diabetes mellitus with diabetic polyneuropathy (HCC)   . Visual loss    right eye    Past Surgical History:  Procedure Laterality Date  . ABDOMINAL HYSTERECTOMY    . BREAST BIOPSY  02/2019  . BREAST LUMPECTOMY WITH RADIOACTIVE SEED LOCALIZATION Right 12/22/2019   Procedure: RIGHT BREAST LUMPECTOMY X 2 WITH RADIOACTIVE SEED LOCALIZATION;  Surgeon:  Abigail Miyamoto, MD;  Location: [facility];  Service: General;  Laterality: Right;  . FLEXIBLE SIGMOIDOSCOPY N/A 06/04/2019   Procedure: FLEXIBLE SIGMOIDOSCOPY;  Surgeon: Corbin Ade, MD;  [facility];  Service: Endoscopy;  Laterality: N/A;  incomplete colonoscopy, poor prep  . IR CT HEAD LTD  08/04/2019  . IR PERCUTANEOUS ART THROMBECTOMY/INFUSION INTRACRANIAL INC DIAG ANGIO  08/04/2019  . LOOP RECORDER INSERTION N/A 08/06/2019   Procedure: LOOP RECORDER INSERTION;  Surgeon: Hillis Range, MD;  Location: [facility];  Service: Cardiovascular;  Laterality: N/A;  . RADIOLOGY WITH ANESTHESIA N/A 08/04/2019   Procedure: IR WITH ANESTHESIA;  Surgeon: Radiologist, Medication, MD;  Location: [facility] OR;  Service: Radiology;  Laterality: N/A;    MEDICATIONS: . ACCU-CHEK AVIVA PLUS test strip  . acetaminophen (TYLENOL) 325 MG tablet  . acetaminophen (TYLENOL) 500 MG tablet  . albuterol (PROVENTIL) (2.5 MG/3ML) 0.083% nebulizer solution  . amitriptyline (ELAVIL) 50 MG tablet  . amLODipine-benazepril (LOTREL) 5-10 MG capsule  . aspirin 325 MG tablet  . atorvastatin (LIPITOR) 40 MG tablet  . BD INSULIN SYRINGE U/F 31G X 5/16" 1 ML MISC  . gabapentin (NEURONTIN) 400 MG capsule  . HUMULIN N 100 UNIT/ML injection  . meclizine (ANTIVERT) 25 MG tablet  . metFORMIN (GLUCOPHAGE) 1000 MG tablet  . RESTASIS 0.05 % ophthalmic emulsion  . senna-docusate (SENOKOT-S) 8.6-50 MG tablet  . traMADol (ULTRAM) 50 MG tablet   No current facility-administered medications for this encounter.     [name], PA-C [facility] [phone]

## 2020-06-30 NOTE — Anesthesia Preprocedure Evaluation (Addendum)
Anesthesia Evaluation  Patient identified by MRN, date of birth, ID band Patient awake    Reviewed: Allergy & Precautions, H&P , NPO status , Patient's Chart, lab work & pertinent test results  Airway Mallampati: II  TM Distance: >3 FB Neck ROM: Full    Dental no notable dental hx.    Pulmonary neg pulmonary ROS,    Pulmonary exam normal breath sounds clear to auscultation       Cardiovascular hypertension, Normal cardiovascular exam Rhythm:Regular Rate:Normal     Neuro/Psych CVA, No Residual Symptoms negative psych ROS   GI/Hepatic negative GI ROS, Neg liver ROS,   Endo/Other  diabetes  Renal/GU negative Renal ROS  negative genitourinary   Musculoskeletal negative musculoskeletal ROS (+)   Abdominal   Peds negative pediatric ROS (+)  Hematology negative hematology ROS (+)   Anesthesia Other Findings   Reproductive/Obstetrics negative OB ROS                            Anesthesia Physical Anesthesia Plan  ASA: III  Anesthesia Plan: General   Post-op Pain Management:    Induction: Intravenous  PONV Risk Score and Plan: 3 and Ondansetron, Dexamethasone and Treatment may vary due to age or medical condition  Airway Management Planned: Oral ETT  Additional Equipment:   Intra-op Plan:   Post-operative Plan: Extubation in OR  Informed Consent: I have reviewed the patients History and Physical, chart, labs and discussed the procedure including the risks, benefits and alternatives for the proposed anesthesia with the patient or authorized representative who has indicated his/her understanding and acceptance.     Dental advisory given  Plan Discussed with: CRNA and Surgeon  Anesthesia Plan Comments: (See PAT note 06/29/2020, [name], PA-C)       Anesthesia Quick Evaluation

## 2020-07-01 ENCOUNTER — Other Ambulatory Visit (HOSPITAL_COMMUNITY)
Admission: RE | Admit: 2020-07-01 | Discharge: 2020-07-01 | Disposition: A | Discharge status: Home / Self Care | Payer: 59 | Source: Ambulatory Visit | Attending: Gynecologic Oncology | Admitting: Gynecologic Oncology

## 2020-07-01 DIAGNOSIS — Z01812 Encounter for preprocedural laboratory examination: Secondary | ICD-10-CM | POA: Insufficient documentation

## 2020-07-01 DIAGNOSIS — Z20822 Contact with and (suspected) exposure to covid-19: Secondary | ICD-10-CM | POA: Diagnosis not present

## 2020-07-01 LAB — SARS CORONAVIRUS 2 (TAT 6-24 HRS): SARS Coronavirus 2: NEGATIVE

## 2020-07-04 ENCOUNTER — Telehealth: Payer: Self-pay

## 2020-07-04 NOTE — Telephone Encounter (Signed)
TC to daughter Zorita Pang.  Daughter stated that patient has finished her magnesium citrate and understands she is clear liquids until NPO status.  Ms. Haywood Lasso stated they understand instructions regarding showering/clean sheets tonight and they know arrival is at [place].

## 2020-07-05 ENCOUNTER — Ambulatory Visit (HOSPITAL_COMMUNITY): Payer: 59 | Admitting: Physician Assistant

## 2020-07-05 ENCOUNTER — Encounter (HOSPITAL_COMMUNITY): Payer: Self-pay | Admitting: Gynecologic Oncology

## 2020-07-05 ENCOUNTER — Ambulatory Visit (HOSPITAL_COMMUNITY)
Admission: RE | Admit: 2020-07-05 | Discharge: 2020-07-05 | Disposition: A | Discharge status: Home / Self Care | Payer: 59 | Attending: Gynecologic Oncology | Admitting: Gynecologic Oncology

## 2020-07-05 ENCOUNTER — Ambulatory Visit (HOSPITAL_COMMUNITY): Payer: 59 | Admitting: Certified Registered Nurse Anesthetist

## 2020-07-05 ENCOUNTER — Encounter (HOSPITAL_COMMUNITY)
Admission: RE | Disposition: A | Discharge status: Home / Self Care | Payer: Self-pay | Source: Home / Self Care | Attending: Gynecologic Oncology

## 2020-07-05 DIAGNOSIS — I69398 Other sequelae of cerebral infarction: Secondary | ICD-10-CM | POA: Diagnosis not present

## 2020-07-05 DIAGNOSIS — Z8042 Family history of malignant neoplasm of prostate: Secondary | ICD-10-CM | POA: Insufficient documentation

## 2020-07-05 DIAGNOSIS — N390 Urinary tract infection, site not specified: Secondary | ICD-10-CM | POA: Diagnosis present

## 2020-07-05 DIAGNOSIS — N83209 Unspecified ovarian cyst, unspecified side: Secondary | ICD-10-CM

## 2020-07-05 DIAGNOSIS — E1142 Type 2 diabetes mellitus with diabetic polyneuropathy: Secondary | ICD-10-CM | POA: Diagnosis not present

## 2020-07-05 DIAGNOSIS — Z809 Family history of malignant neoplasm, unspecified: Secondary | ICD-10-CM | POA: Diagnosis not present

## 2020-07-05 DIAGNOSIS — E669 Obesity, unspecified: Secondary | ICD-10-CM | POA: Diagnosis not present

## 2020-07-05 DIAGNOSIS — H5461 Unqualified visual loss, right eye, normal vision left eye: Secondary | ICD-10-CM | POA: Insufficient documentation

## 2020-07-05 DIAGNOSIS — D270 Benign neoplasm of right ovary: Secondary | ICD-10-CM | POA: Insufficient documentation

## 2020-07-05 DIAGNOSIS — Z7982 Long term (current) use of aspirin: Secondary | ICD-10-CM | POA: Insufficient documentation

## 2020-07-05 DIAGNOSIS — Z8051 Family history of malignant neoplasm of kidney: Secondary | ICD-10-CM | POA: Diagnosis not present

## 2020-07-05 DIAGNOSIS — Z90710 Acquired absence of both cervix and uterus: Secondary | ICD-10-CM | POA: Insufficient documentation

## 2020-07-05 DIAGNOSIS — IMO0002 Reserved for concepts with insufficient information to code with codable children: Secondary | ICD-10-CM | POA: Diagnosis present

## 2020-07-05 DIAGNOSIS — R1900 Intra-abdominal and pelvic swelling, mass and lump, unspecified site: Secondary | ICD-10-CM | POA: Diagnosis not present

## 2020-07-05 DIAGNOSIS — E1165 Type 2 diabetes mellitus with hyperglycemia: Secondary | ICD-10-CM | POA: Diagnosis present

## 2020-07-05 DIAGNOSIS — Z794 Long term (current) use of insulin: Secondary | ICD-10-CM | POA: Insufficient documentation

## 2020-07-05 DIAGNOSIS — D191 Benign neoplasm of mesothelial tissue of peritoneum: Secondary | ICD-10-CM | POA: Insufficient documentation

## 2020-07-05 DIAGNOSIS — Z79899 Other long term (current) drug therapy: Secondary | ICD-10-CM | POA: Diagnosis not present

## 2020-07-05 DIAGNOSIS — Z801 Family history of malignant neoplasm of trachea, bronchus and lung: Secondary | ICD-10-CM | POA: Diagnosis not present

## 2020-07-05 DIAGNOSIS — Z6836 Body mass index (BMI) 36.0-36.9, adult: Secondary | ICD-10-CM | POA: Diagnosis not present

## 2020-07-05 DIAGNOSIS — Z803 Family history of malignant neoplasm of breast: Secondary | ICD-10-CM | POA: Insufficient documentation

## 2020-07-05 HISTORY — PX: ROBOTIC ASSISTED BILATERAL SALPINGO OOPHERECTOMY: SHX6078

## 2020-07-05 LAB — GLUCOSE, CAPILLARY
Glucose-Capillary: 93 mg/dL (ref 70–99)
Glucose-Capillary: 171 mg/dL — ABNORMAL HIGH (ref 70–99)

## 2020-07-05 SURGERY — SALPINGO-OOPHORECTOMY, BILATERAL, ROBOT-ASSISTED
Anesthesia: General | Laterality: Bilateral

## 2020-07-05 MED ORDER — ONDANSETRON HCL 4 MG/2ML IJ SOLN
INTRAMUSCULAR | Status: AC
  Filled 2020-07-05: qty 2

## 2020-07-05 MED ORDER — BUPIVACAINE HCL 0.25 % IJ SOLN
INTRAMUSCULAR | Status: DC | PRN
  Administered 2020-07-05: 24 mL

## 2020-07-05 MED ORDER — SODIUM CHLORIDE 0.9% FLUSH: 3.0000 mL | INTRAVENOUS | Status: DC | PRN

## 2020-07-05 MED ORDER — ONDANSETRON HCL 4 MG/2ML IJ SOLN: 4.0000 mg | Freq: Once | INTRAMUSCULAR | Status: DC | PRN

## 2020-07-05 MED ORDER — ACETAMINOPHEN 500 MG PO TABS
1000.0000 mg | ORAL_TABLET | ORAL | Status: AC
  Administered 2020-07-05: 1000 mg via ORAL
  Filled 2020-07-05: qty 2

## 2020-07-05 MED ORDER — FENTANYL CITRATE (PF) 100 MCG/2ML IJ SOLN
INTRAMUSCULAR | Status: DC | PRN
  Administered 2020-07-05 (×2): 50 ug via INTRAVENOUS

## 2020-07-05 MED ORDER — ACETAMINOPHEN 325 MG PO TABS: 650.0000 mg | ORAL_TABLET | ORAL | Status: DC | PRN

## 2020-07-05 MED ORDER — LACTATED RINGERS IV SOLN: INTRAVENOUS | Status: DC

## 2020-07-05 MED ORDER — SODIUM CHLORIDE 0.9 % IV SOLN: 250.0000 mL | INTRAVENOUS | Status: DC | PRN

## 2020-07-05 MED ORDER — PHENYLEPHRINE HCL (PRESSORS) 10 MG/ML IV SOLN
INTRAVENOUS | Status: AC
  Filled 2020-07-05: qty 1

## 2020-07-05 MED ORDER — BUPIVACAINE HCL 0.25 % IJ SOLN
INTRAMUSCULAR | Status: AC
  Filled 2020-07-05: qty 1

## 2020-07-05 MED ORDER — OXYCODONE HCL 5 MG PO TABS: 5.0000 mg | ORAL_TABLET | ORAL | Status: DC | PRN

## 2020-07-05 MED ORDER — PHENYLEPHRINE HCL-NACL 10-0.9 MG/250ML-% IV SOLN
INTRAVENOUS | Status: DC | PRN
  Administered 2020-07-05: 50 ug/min via INTRAVENOUS

## 2020-07-05 MED ORDER — ONDANSETRON HCL 4 MG/2ML IJ SOLN
INTRAMUSCULAR | Status: DC | PRN
  Administered 2020-07-05: 4 mg via INTRAVENOUS

## 2020-07-05 MED ORDER — PHENYLEPHRINE 40 MCG/ML (10ML) SYRINGE FOR IV PUSH (FOR BLOOD PRESSURE SUPPORT)
PREFILLED_SYRINGE | INTRAVENOUS | Status: AC
  Filled 2020-07-05: qty 10

## 2020-07-05 MED ORDER — 0.9 % SODIUM CHLORIDE (POUR BTL) OPTIME
TOPICAL | Status: DC | PRN
  Administered 2020-07-05: 3000 mL

## 2020-07-05 MED ORDER — ORAL CARE MOUTH RINSE: 15.0000 mL | Freq: Once | OROMUCOSAL | Status: DC

## 2020-07-05 MED ORDER — CHLORHEXIDINE GLUCONATE 0.12 % MT SOLN: 15.0000 mL | Freq: Once | OROMUCOSAL | Status: AC

## 2020-07-05 MED ORDER — HYDROMORPHONE HCL 1 MG/ML IJ SOLN
0.2500 mg | INTRAMUSCULAR | Status: DC | PRN
  Administered 2020-07-05: 0.5 mg via INTRAVENOUS

## 2020-07-05 MED ORDER — MORPHINE SULFATE (PF) 4 MG/ML IV SOLN: 2.0000 mg | INTRAVENOUS | Status: DC | PRN

## 2020-07-05 MED ORDER — GABAPENTIN 300 MG PO CAPS
300.0000 mg | ORAL_CAPSULE | ORAL | Status: AC
  Administered 2020-07-05: 300 mg via ORAL
  Filled 2020-07-05: qty 1

## 2020-07-05 MED ORDER — HYDROMORPHONE HCL 1 MG/ML IJ SOLN
INTRAMUSCULAR | Status: AC
  Filled 2020-07-05: qty 1

## 2020-07-05 MED ORDER — LIDOCAINE HCL (PF) 2 % IJ SOLN
INTRAMUSCULAR | Status: AC
  Filled 2020-07-05: qty 5

## 2020-07-05 MED ORDER — FENTANYL CITRATE (PF) 100 MCG/2ML IJ SOLN
INTRAMUSCULAR | Status: AC
  Filled 2020-07-05: qty 2

## 2020-07-05 MED ORDER — LACTATED RINGERS IR SOLN
Status: DC | PRN
  Administered 2020-07-05: 1000 mL

## 2020-07-05 MED ORDER — POVIDONE-IODINE 10 % EX SWAB
2.0000 "application " | Freq: Once | CUTANEOUS | Status: AC
  Administered 2020-07-05: 2 via TOPICAL

## 2020-07-05 MED ORDER — BUPIVACAINE LIPOSOME 1.3 % IJ SUSP
20.0000 mL | Freq: Once | INTRAMUSCULAR | Status: AC
  Administered 2020-07-05: 20 mL
  Filled 2020-07-05: qty 20

## 2020-07-05 MED ORDER — DEXAMETHASONE SODIUM PHOSPHATE 10 MG/ML IJ SOLN
INTRAMUSCULAR | Status: AC
  Filled 2020-07-05: qty 1

## 2020-07-05 MED ORDER — PROPOFOL 10 MG/ML IV BOLUS
INTRAVENOUS | Status: AC
  Filled 2020-07-05: qty 20

## 2020-07-05 MED ORDER — ORAL CARE MOUTH RINSE
15.0000 mL | Freq: Once | OROMUCOSAL | Status: AC
  Administered 2020-07-05: 15 mL via OROMUCOSAL

## 2020-07-05 MED ORDER — PROPOFOL 10 MG/ML IV BOLUS
INTRAVENOUS | Status: DC | PRN
  Administered 2020-07-05: 120 mg via INTRAVENOUS

## 2020-07-05 MED ORDER — SUGAMMADEX SODIUM 200 MG/2ML IV SOLN
INTRAVENOUS | Status: DC | PRN
  Administered 2020-07-05: 200 mg via INTRAVENOUS

## 2020-07-05 MED ORDER — ENOXAPARIN SODIUM 40 MG/0.4ML ~~LOC~~ SOLN
40.0000 mg | SUBCUTANEOUS | Status: AC
  Administered 2020-07-05: 40 mg via SUBCUTANEOUS
  Filled 2020-07-05: qty 0.4

## 2020-07-05 MED ORDER — ACETAMINOPHEN 650 MG RE SUPP
650.0000 mg | RECTAL | Status: DC | PRN
  Filled 2020-07-05: qty 1

## 2020-07-05 MED ORDER — LIDOCAINE 2% (20 MG/ML) 5 ML SYRINGE
INTRAMUSCULAR | Status: DC | PRN
  Administered 2020-07-05: 50 mg via INTRAVENOUS

## 2020-07-05 MED ORDER — SODIUM CHLORIDE 0.9% FLUSH: 3.0000 mL | Freq: Two times a day (BID) | INTRAVENOUS | Status: DC

## 2020-07-05 MED ORDER — ROCURONIUM BROMIDE 10 MG/ML (PF) SYRINGE
PREFILLED_SYRINGE | INTRAVENOUS | Status: DC | PRN
  Administered 2020-07-05: 10 mg via INTRAVENOUS
  Administered 2020-07-05: 60 mg via INTRAVENOUS

## 2020-07-05 MED ORDER — ACETAMINOPHEN 10 MG/ML IV SOLN: 1000.0000 mg | Freq: Once | INTRAVENOUS | Status: DC | PRN

## 2020-07-05 MED ORDER — ROCURONIUM BROMIDE 10 MG/ML (PF) SYRINGE
PREFILLED_SYRINGE | INTRAVENOUS | Status: AC
  Filled 2020-07-05: qty 10

## 2020-07-05 MED ORDER — CEFAZOLIN SODIUM-DEXTROSE 2-4 GM/100ML-% IV SOLN
2.0000 g | INTRAVENOUS | Status: AC
  Administered 2020-07-05: 2 g via INTRAVENOUS
  Filled 2020-07-05: qty 100

## 2020-07-05 MED ORDER — STERILE WATER FOR IRRIGATION IR SOLN
Status: DC | PRN
  Administered 2020-07-05: 1000 mL

## 2020-07-05 MED ORDER — EPHEDRINE SULFATE-NACL 50-0.9 MG/10ML-% IV SOSY
PREFILLED_SYRINGE | INTRAVENOUS | Status: DC | PRN
  Administered 2020-07-05: 5 mg via INTRAVENOUS
  Administered 2020-07-05 (×3): 10 mg via INTRAVENOUS

## 2020-07-05 MED ORDER — CHLORHEXIDINE GLUCONATE 0.12 % MT SOLN: 15.0000 mL | Freq: Once | OROMUCOSAL | Status: DC

## 2020-07-05 MED ORDER — PHENYLEPHRINE 40 MCG/ML (10ML) SYRINGE FOR IV PUSH (FOR BLOOD PRESSURE SUPPORT)
PREFILLED_SYRINGE | INTRAVENOUS | Status: DC | PRN
  Administered 2020-07-05: 80 ug via INTRAVENOUS
  Administered 2020-07-05: 120 ug via INTRAVENOUS
  Administered 2020-07-05 (×2): 80 ug via INTRAVENOUS
  Administered 2020-07-05: 120 ug via INTRAVENOUS

## 2020-07-05 MED ORDER — DEXAMETHASONE SODIUM PHOSPHATE 10 MG/ML IJ SOLN
INTRAMUSCULAR | Status: DC | PRN
  Administered 2020-07-05: 8 mg via INTRAVENOUS

## 2020-07-05 SURGICAL SUPPLY — 72 items
APPLICATOR SURGIFLO ENDO (HEMOSTASIS) IMPLANT
BACTOSHIELD CHG 4% 4OZ (MISCELLANEOUS) ×1
BAG LAPAROSCOPIC 12 15 PORT 16 (BASKET) IMPLANT
BAG RETRIEVAL 12/15 (BASKET)
BLADE SURG SZ10 CARB STEEL (BLADE) ×2 IMPLANT
CELLS DAT CNTRL 66122 CELL SVR (MISCELLANEOUS) ×1 IMPLANT
COVER BACK TABLE 60X90IN (DRAPES) IMPLANT
COVER TIP SHEARS 8 DVNC (MISCELLANEOUS) ×1 IMPLANT
COVER TIP SHEARS 8MM DA VINCI (MISCELLANEOUS) ×1
COVER WAND RF STERILE (DRAPES) IMPLANT
DECANTER SPIKE VIAL GLASS SM (MISCELLANEOUS) ×2 IMPLANT
DERMABOND ADVANCED (GAUZE/BANDAGES/DRESSINGS) ×1
DERMABOND ADVANCED .7 DNX12 (GAUZE/BANDAGES/DRESSINGS) ×1 IMPLANT
DRAPE ARM DVNC X/XI (DISPOSABLE) ×4 IMPLANT
DRAPE COLUMN DVNC XI (DISPOSABLE) ×1 IMPLANT
DRAPE DA VINCI XI ARM (DISPOSABLE) ×4
DRAPE DA VINCI XI COLUMN (DISPOSABLE) ×1
DRAPE SHEET LG 3/4 BI-LAMINATE (DRAPES) ×2 IMPLANT
DRAPE SURG IRRIG POUCH 19X23 (DRAPES) ×2 IMPLANT
DRSG OPSITE POSTOP 4X6 (GAUZE/BANDAGES/DRESSINGS) ×2 IMPLANT
DRSG OPSITE POSTOP 4X8 (GAUZE/BANDAGES/DRESSINGS) IMPLANT
ELECT PENCIL ROCKER SW 15FT (MISCELLANEOUS) ×2 IMPLANT
ELECT REM PT RETURN 15FT ADLT (MISCELLANEOUS) ×2 IMPLANT
GLOVE SURG ENC MOIS LTX SZ6 (GLOVE) ×8 IMPLANT
GLOVE SURG ENC MOIS LTX SZ6.5 (GLOVE) ×4 IMPLANT
GOWN STRL REUS W/ TWL LRG LVL3 (GOWN DISPOSABLE) ×4 IMPLANT
GOWN STRL REUS W/TWL LRG LVL3 (GOWN DISPOSABLE) ×4
HANDLE SUCTION POOLE (INSTRUMENTS) ×1 IMPLANT
HOLDER FOLEY CATH W/STRAP (MISCELLANEOUS) IMPLANT
IRRIG SUCT STRYKERFLOW 2 WTIP (MISCELLANEOUS) ×4
IRRIGATION SUCT STRKRFLW 2 WTP (MISCELLANEOUS) ×2 IMPLANT
KIT PROCEDURE DA VINCI SI (MISCELLANEOUS)
KIT PROCEDURE DVNC SI (MISCELLANEOUS) IMPLANT
KIT TURNOVER KIT A (KITS) IMPLANT
MANIPULATOR UTERINE 4.5 ZUMI (MISCELLANEOUS) ×2 IMPLANT
NEEDLE HYPO 22GX1.5 SAFETY (NEEDLE) ×2 IMPLANT
NEEDLE SPNL 18GX3.5 QUINCKE PK (NEEDLE) IMPLANT
OBTURATOR OPTICAL STANDARD 8MM (TROCAR) ×1
OBTURATOR OPTICAL STND 8 DVNC (TROCAR) ×1
OBTURATOR OPTICALSTD 8 DVNC (TROCAR) ×1 IMPLANT
PACK ROBOT GYN CUSTOM WL (TRAY / TRAY PROCEDURE) ×2 IMPLANT
PAD POSITIONING PINK XL (MISCELLANEOUS) ×2 IMPLANT
PORT ACCESS TROCAR AIRSEAL 12 (TROCAR) ×1 IMPLANT
PORT ACCESS TROCAR AIRSEAL 5M (TROCAR) ×1
POUCH SPECIMEN RETRIEVAL 10MM (ENDOMECHANICALS) IMPLANT
RTRCTR WOUND ALEXIS 18CM MED (MISCELLANEOUS) ×2
SCISSORS LAP 5X45 EPIX DISP (ENDOMECHANICALS) ×2 IMPLANT
SCRUB CHG 4% DYNA-HEX 4OZ (MISCELLANEOUS) ×1 IMPLANT
SEAL CANN UNIV 5-8 DVNC XI (MISCELLANEOUS) ×4 IMPLANT
SEAL XI 5MM-8MM UNIVERSAL (MISCELLANEOUS) ×4
SET TRI-LUMEN FLTR TB AIRSEAL (TUBING) ×2 IMPLANT
SPONGE LAP 18X18 RF (DISPOSABLE) ×8 IMPLANT
SUCTION POOLE HANDLE (INSTRUMENTS) ×2
SURGIFLO W/THROMBIN 8M KIT (HEMOSTASIS) IMPLANT
SUT MNCRL AB 4-0 PS2 18 (SUTURE) ×2 IMPLANT
SUT PDS AB 1 TP1 96 (SUTURE) ×4 IMPLANT
SUT VIC AB 0 CT1 27 (SUTURE)
SUT VIC AB 0 CT1 27XBRD ANTBC (SUTURE) IMPLANT
SUT VIC AB 2-0 CT1 27 (SUTURE)
SUT VIC AB 2-0 CT1 TAPERPNT 27 (SUTURE) IMPLANT
SUT VIC AB 4-0 PS2 18 (SUTURE) ×6 IMPLANT
SYR 10ML LL (SYRINGE) IMPLANT
SYR 20ML LL LF (SYRINGE) IMPLANT
SYR 50ML LL SCALE MARK (SYRINGE) IMPLANT
TOWEL OR NON WOVEN STRL DISP B (DISPOSABLE) ×2 IMPLANT
TRAP SPECIMEN MUCUS 40CC (MISCELLANEOUS) ×2 IMPLANT
TRAY FOLEY MTR SLVR 16FR STAT (SET/KITS/TRAYS/PACK) ×2 IMPLANT
TROCAR XCEL NON-BLD 5MMX100MML (ENDOMECHANICALS) ×2 IMPLANT
UNDERPAD 30X36 HEAVY ABSORB (UNDERPADS AND DIAPERS) ×2 IMPLANT
WATER STERILE IRR 1000ML POUR (IV SOLUTION) ×2 IMPLANT
YANKAUER SUCT BULB TIP 10FT TU (MISCELLANEOUS) ×4 IMPLANT
YANKAUER SUCT BULB TIP NO VENT (SUCTIONS) ×2 IMPLANT

## 2020-07-05 NOTE — H&P (Signed)
H&P Note: Gyn-Onc  Consult was requested by Dr. Elonda Husky for the evaluation of Jean Watts 80 y.o. female  CC:  Ovarian cystic mass  Assessment/Plan:  [name]  is a 80 y.o.  year old with a 16cm ovarian cyst associated with normal Ca1 25 and no symptoms.  I have a low suspicion that this is malignant.  I offered the patient either expectant management versus surgical removal.  She elected for surgery.  Surgery would require robotic assisted BSO with possible mini laparotomy for cyst decompression.  Given how deep the cyst is relative to the abdominal wall, cyst decompression may be challenging.  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  She had a event monitor implanted that failed to detect arrhythmias.  Previous notes from her cardiology team indicate that her aspirin usage is for stroke prevention and not for cardiac reasons.  I had recommended she stop her aspirin 10 days preoperatively and hold 7 days postoperatively however prior to her previously scheduled OR date in December she had not held this medication. Therefore it was rescheduled.  I explained other risks of surgery including  bleeding, infection, damage to internal organs (such as bladder,ureters, bowels), blood clot, reoperation and rehospitalization. Her obesity with a BMI of 36 kg per metered squared and her diabetes mellitus mean that she is at increased risk for these complications.  HPI: Ms Jean Watts is a 80 year old P1 who was seen in consultation at the request of Dr Elonda Husky for evaluation of a 16cm cystic pelvic mass.  The patient was admitted to [facility] on 04/30/2020 for pyelonephritis and urosepsis.  As part of her work-up she received a CT scan of the abdomen and pelvis on 05/03/2020 which revealed mild bilateral hydronephrosis likely secondary to compression from a distal pelvic  mass.  There was a 16 x 13 x 11 cm predominantly cystic lesion with probable areas of septation in the pelvis.  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  Pelvic ultrasound scan was performed on 05/01/2020.  It demonstrated a surgically absent uterus.  There was a 14.8 x 13.4 x 11 cm complex mass noted in the pelvis.  There are multiple septations and probable solid components in the pelvis.  Ca1 25 was drawn on 05/02/2020 and was normal at 6.  Hemoglobin A1c was drawn on 04/30/2020 and was mildly elevated at 7.  The patient denied symptoms from the mass.  She denied abdominal pain, urinary frequency, or pelvic pressure.  Her medical history is most significant for diabetes mellitus for which she takes insulin.  She has ocular changes from this.  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  She had a cardiac event monitor that placed internally in February 2021 by Dr Rayann Heman which failed to demonstrate an arrhythmia around the time of her strokes.  She does not have a history of atrial fibrillation.  Her surgical history is most significant for an abdominal hysterectomy for menorrhagia.  Her gynecologic history is remarkable for 1 prior SVD.  Her family cancer history is her daughter had kidney cancer and breast cancer at age 76, her mother had stomach cancer and was a non-smoker, her brother had lung cancer was a non-smoker.  She does not  work outside of the house. She lives with her daughter.   Current Meds:  Outpatient Encounter Medications as of 05/11/2020  Medication Sig  . ACCU-CHEK AVIVA PLUS test strip   . acetaminophen (TYLENOL) 325 MG tablet Take 2 tablets (650 mg total) by mouth every 4 (four) hours as needed for headache.  . albuterol (PROVENTIL) (2.5 MG/3ML) 0.083% nebulizer solution Inhale 2.5 mg into the lungs every 6 (six) hours as needed for wheezing or  shortness of breath.   Marland Kitchen amitriptyline (ELAVIL) 50 MG tablet Take 50 mg by mouth at bedtime.   Marland Kitchen amLODipine-benazepril (LOTREL) 5-10 MG capsule Take 1 capsule by mouth daily.  Marland Kitchen aspirin 325 MG tablet Take 1 tablet (325 mg total) by mouth daily with breakfast.  . atorvastatin (LIPITOR) 40 MG tablet Take 1 tablet (40 mg total) by mouth daily at 6 PM.  . BD INSULIN SYRINGE U/F 31G X 5/16" 1 ML MISC 2 (two) times daily. as directed  . furosemide (LASIX) 20 MG tablet Take 20 mg by mouth as needed.  . gabapentin (NEURONTIN) 300 MG capsule Take 300 mg by mouth at bedtime.  Marland Kitchen HUMULIN N 100 UNIT/ML injection Inject 0.15-0.35 mLs (15-35 Units total) into the skin See admin instructions. Inject 35 units into the skin in the morning and 15 units in the evening  . meclizine (ANTIVERT) 25 MG tablet Take 25 mg by mouth at bedtime.   . metFORMIN (GLUCOPHAGE) 1000 MG tablet Take 1,000 mg by mouth 2 (two) times daily.   . metoprolol tartrate (LOPRESSOR) 25 MG tablet Take 25 mg by mouth 2 (two) times daily.   . RESTASIS 0.05 % ophthalmic emulsion 1 drop 2 (two) times daily.  . traMADol (ULTRAM) 50 MG tablet Take 1 tablet (50 mg total) by mouth every 6 (six) hours as needed for moderate pain.   No facility-administered encounter medications on file as of 05/11/2020.    Allergy: No Known Allergies  Social Hx:   Social History   Socioeconomic History  . Marital status: Widowed    Spouse name: Not on file  . Number of children: Not on file  . Years of education: Not on file  . Highest education level: Not on file  Occupational History  . Not on file  Tobacco Use  . Smoking status: Never Smoker  . Smokeless tobacco: Never Used  Vaping Use  . Vaping Use: Never used  Substance and Sexual Activity  . Alcohol use: Never  . Drug use: Never  . Sexual activity: Not Currently    Birth control/protection: Surgical    Comment: Hysterectomy  Other Topics Concern  . Not on file  Social History Narrative  .  Not on file   Social Determinants of Health   Financial Resource Strain: Not on file  Food Insecurity: Not on file  Transportation Needs: Not on file  Physical Activity: Not on file  Stress: Not on file  Social Connections: Not on file  Intimate Partner Violence: Not on file    Past Surgical Hx:  Past Surgical History:  Procedure Laterality Date  . ABDOMINAL HYSTERECTOMY    . BREAST BIOPSY  02/2019  . BREAST LUMPECTOMY WITH RADIOACTIVE SEED LOCALIZATION Right 12/22/2019   Procedure: RIGHT BREAST LUMPECTOMY X 2 WITH RADIOACTIVE SEED LOCALIZATION;  Surgeon: Coralie Keens, MD;  Location: [place];  Service: General;  Laterality: Right;  . FLEXIBLE SIGMOIDOSCOPY N/A 06/04/2019   Procedure: FLEXIBLE SIGMOIDOSCOPY;  Surgeon: Daneil Dolin, MD;  [facility];  Service: Endoscopy;  Laterality: N/A;  incomplete colonoscopy, poor prep  . IR CT HEAD LTD  08/04/2019  . IR PERCUTANEOUS ART THROMBECTOMY/INFUSION INTRACRANIAL INC DIAG ANGIO  08/04/2019  . LOOP RECORDER INSERTION N/A 08/06/2019   Procedure: LOOP RECORDER INSERTION;  Surgeon: Thompson Grayer, MD;  Location: [place] CV [facility];  Service: Cardiovascular;  Laterality: N/A;  . RADIOLOGY WITH ANESTHESIA N/A 08/04/2019   Procedure: IR WITH ANESTHESIA;  Surgeon: Radiologist, Medication, MD;  Location: [place];  Service: Radiology;  Laterality: N/A;    Past Medical Hx:  Past Medical History:  Diagnosis Date  . Arthritis   . Dyspnea   . Headache   . HLD (hyperlipidemia)   . HTN (hypertension)   . Neuropathy   . Stroke [facility])    no residual   . Type 2 diabetes mellitus with diabetic polyneuropathy ([place])   . Visual loss    right eye    Past Gynecological History:  See HPI No LMP recorded. Patient has had a hysterectomy.  Family Hx:  Family History  Problem Relation Age of Onset  . Cancer Mother   . Heart attack Father   . Prostate cancer Brother   . Lung cancer Brother   . Cancer Daughter 64        kidney  . Breast cancer Daughter 64  . Colon cancer Neg Hx   . Colon polyps Neg Hx     Review of Systems:  Constitutional  Feels well,    ENT Normal appearing ears and nares bilaterally Skin/Breast  No rash, sores, jaundice, itching, dryness Cardiovascular  No chest pain, shortness of breath, or edema  Pulmonary  No cough or wheeze.  Gastro Intestinal  No nausea, vomitting, or diarrhoea. No bright red blood per rectum, no abdominal pain, change in bowel movement, + constipation.  Genito Urinary  No frequency, urgency, dysuria,  Musculo Skeletal  No myalgia, arthralgia, joint swelling or pain  Neurologic  No weakness, numbness, change in gait,  Psychology  No depression, anxiety, insomnia.   Vitals:  Blood pressure (!) 162/71, pulse 70, temperature 98.6 F (37 C), temperature source Oral, resp. rate 16, height 5\' 4"  (1.626 m), weight 210 lb (95.3 kg), SpO2 99 %.  Physical Exam: WD in NAD Neck  Supple NROM, without any enlargements.  Lymph Node Survey No cervical supraclavicular or inguinal adenopathy Cardiovascular  Pulse normal rate, regularity and rhythm. S1 and S2 normal.  Lungs  Clear to auscultation bilateraly, without wheezes/crackles/rhonchi. Good air movement.  Skin  No rash/lesions/breakdown  Psychiatry  Alert and oriented to person, place, and time  Abdomen  Normoactive bowel sounds, abdomen soft, non-tender and obese without evidence of hernia. Fullness in lower abdomen consistent with cystic mass Back No CVA tenderness Genito Urinary  Vulva/vagina: Normal external female genitalia.  No lesions. No discharge or bleeding.  Bladder/urethra:  No lesions or masses, well supported bladder  Vagina: normal, smooth  Cervix and uterus surgically absent  Adnexa: smooth firm mass abutting the vaginal cuff, not mobile. Rectal  deferred Extremities  No bilateral cyanosis, clubbing or edema.   Thereasa Solo, MD  07/05/2020, 9:29 AM

## 2020-07-05 NOTE — Transfer of Care (Signed)
Immediate Anesthesia Transfer of Care Note  Patient: Jean Watts  Procedure(s) Performed: XI ROBOTIC ASSISTED RIGHT SALPINGO OOPHORECTOMY WITH MINI LAPAROTOMY AND LYSIS OF ADHESIONS (Bilateral )  Patient Location: PACU  Anesthesia Type:General  Level of Consciousness: awake, alert  and oriented  Airway & Oxygen Therapy: Patient Spontanous Breathing and Patient connected to face mask oxygen  Post-op Assessment: Report given to RN, Post -op Vital signs reviewed and stable and Patient moving all extremities X 4  Post vital signs: Reviewed and stable  Last Vitals:  Vitals Value Taken Time  BP    Temp    Pulse 56 07/05/20 1245  Resp 20 07/05/20 1245  SpO2 93 % 07/05/20 1245  Vitals shown include unvalidated device data.  Last Pain:  Vitals:   07/05/20 0854  TempSrc:   PainSc: 0-No pain         Complications: No complications documented.

## 2020-07-05 NOTE — Op Note (Signed)
OPERATIVE NOTE  Date: 07/05/20  Preoperative Diagnosis: ovarian cyst   Postoperative Diagnosis:  same  Procedure(s) Performed: Robotic-assisted laparoscopic bilateral salpingo-oophorectomy, lysis of adhesions, minilaparotomy.   Surgeon: Everitt Amber, M.D.  Assistant Surgeon: Lahoma Crocker M.D. (an MD assistant was necessary for tissue manipulation, management of robotic instrumentation, retraction and positioning due to the complexity of the case and hospital policies).   Anesthesia: Gen. endotracheal.  Specimens: right tube and ovary, omentum, washings  Estimated Blood Loss: 200 mL. Blood Replacement: None  Complications: none  Indication for Procedure:  20cm ovarian cyst, normal tumor markers.   Operative Findings: dense adhesions on anterior abdominal abdominal wall from omentum. Omentum, ileum and sigmoid mesentery densely adherent to ovarian cyst. Unavoidable cyst rupture due to dense adhesions. Cyst filled with mucinous fluid.  Normal upper abdomen. Surgically left ovary. Grossly normal appendix.   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   Procedure: The patient's taken to the operating room and placed under general endotracheal anesthesia testing difficulty. She is placed in a dorsolithotomy position and cervical acromial pad was placed. The arms were tucked with care taken to pad the olecranon process. And prepped and draped in usual sterile fashion. A foley was placed with aseptic technique. A 58mm incision was made in the left upper quadrant palmer's point and a 5 mm Optiview trocar used to enter the abdomen under direct visualization. With entry into the abdomen and then maintenance of 15 mm of mercury the patient was placed in Trendelenburg position.  There were dense adhesions between the omentum and the anterior abdominal wall and therefore the additional planned port sites were marked but not all incised at this time.  A 5 mm right upper quadrant port  was placed under direct visualization for assistance of adhesiolysis.  An 8 mm robotic port was placed in the mid right abdomen.  These 2 port sites were utilized for sharp adhesiolysis using laparoscopic scissors and grasper.  This adhesiolysis proceeded for approximately 30 minutes and took down the adhesions between the omentum and the anterior abdominal wall and the omentum and the ovarian cyst.    An incision was made above the umbilicus and a 20mm trochar was placed through this site. Two incisions were made lateral to the umbilical incision in the left abdomen measuring 60mm. These incisions were made approximately 10 and 20 cm lateral to the umbilical incision. 8 mm robotic trochars were inserted. The robot was docked.  The abdomen was inspected as was the pelvis.  Pelvic washings were obtained.  For approximately 45 minutes sharp adhesiolysis was performed to separate the dense adhesions between the omentum, pelvic peritoneum, and sigmoid colon mesentery to the ovarian cystic mass.  The ileum and appendix were also adherent to the mass and these were sharply dissected free from the capsular attachments.  After adhesiolysis it was apparent that the cyst was arising from the right tube and ovary and had replaced the structure.  There was unavoidable cyst rupture during adhesiolysis due to the dense adhesions.  There was irruption of mucinous fluid that was too thick to be aspirated with the suction irrigator.  The right peritoneum was opened parallel to the right ovarian vessels to open the retroperitoneal space and the pararectal space was developed to identify the ureter and the medial leaf of the broad ligament.  A window was made in the broad ligament above the level of the ureter to skeletonize the right ovarian vessels.  These were then bipolar sealed and transected.  The peritoneum of the broad ligament was taken down mobilizing the cyst from its lateral sidewall attachments in doing so.  The  vaginal attachments between the cyst and vagina were then sharply transected.  After complete circumferential adhesiolysis had freed the ovarian mass from its attachments the ovary was placed in an Endo Catch bag.  Inspection took place of the dissection planes with particular focus on the sigmoid colon mesentery, sigmoid colon wall, and ileum and appendix.  There was no apparent breach of the bowel wall from the surgical dissection.  Due to the large volume of mucin within the peritoneal cavity decision was made to proceed with mini laparotomy for specimen delivery and evacuation of the peritoneal mucin.  Hemostasis was observed at all surgical sites.  The peritoneal cavity was Insufflated but the robot was undocked after removal of instruments.  A 6cm supraumbilical vertical incision was made with the scalpel. The subcutaneous skin was opened with the bovie. The fascia was opened with the bovie vertically and the rectus muscles were separated. The peritoneum was opened sharply in the midline. The peritoneal incision was extended. The uterine specimen in the endocatch bag was retrieved through the incision.  Manual exploration of the peritoneal cavity evacuated all visible and palpable mucinous contents.  3 L of warmed saline was placed within the peritoneal cavity and aspirated with a pull tip suction.  Sweep of the peritoneal cavity revealed no residual mucin.  The omentum was delivered through the incision and inspected for hemostasis.  There was small sites of bleeding at the infracolic omentum therefore a partial omentectomy was performed using the Bovie to separate the pedicles and remove the bleeding segment of omentum.  The remaining omentum appeared viable.  The fascia was closed with running looped PDS in a mass closure from cephalad to mid and then caudad to the midportion of the incision.  The subcutaneous fat was closed with 2-0 vicryl. 20cc of exparel mixed with 20cc of marcaine was  infiltrated into the incision. The incision was closed at the skin with monocryl and dermabond.   The ports were all removed. The fascial closure at the left upper quadrant port was made with 0 Vicryl.  All incisions were closed with a running subcuticular Monocryl suture. Dermabond was applied. Sponge, lap and needle counts were correct x 3.    The patient had sequential compression devices for VTE prophylaxis.         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  Donaciano Eva, MD

## 2020-07-05 NOTE — Anesthesia Procedure Notes (Signed)
Procedure Name: Intubation Date/Time: 07/05/2020 9:53 AM Performed by: Niel Hummer, CRNA Pre-anesthesia Checklist: Patient identified, Emergency Drugs available, Suction available and Patient being monitored Patient Re-evaluated:Patient Re-evaluated prior to induction Oxygen Delivery Method: Circle system utilized Preoxygenation: Pre-oxygenation with 100% oxygen Induction Type: IV induction Ventilation: Mask ventilation without difficulty and Oral airway inserted - appropriate to patient size Laryngoscope Size: Mac and 4 Grade View: Grade I Tube type: Oral Tube size: 7.0 mm Number of attempts: 1 Airway Equipment and Method: Stylet Placement Confirmation: ETT inserted through vocal cords under direct vision,  positive ETCO2 and breath sounds checked- equal and bilateral Secured at: 22 cm Tube secured with: Tape Dental Injury: Teeth and Oropharynx as per pre-operative assessment

## 2020-07-05 NOTE — Discharge Instructions (Signed)
Return to work: 4 weeks (2 weeks with physical restrictions).  Activity: 1. Be up and out of the bed during the day.  Take a nap if needed.  You may walk up steps but be careful and use the hand rail.  Stair climbing will tire you more than you think, you may need to stop part way and rest.   2. No lifting or straining for 4 weeks.  3. No driving for 1 weeks.  Do Not drive if you are taking narcotic pain medicine.  4. Shower daily.  Use soap and water on your incision and pat dry; don't rub.   5. No sexual activity and nothing in the vagina for 8 weeks.  Medications:  - Take ibuprofen and tylenol first line for pain control. Take these regularly (every 6 hours) to decrease the build up of pain.  - If necessary, for severe pain not relieved by ibuprofen, contact Dr Serita Grit office and you will be prescribed percocet.  - While taking percocet you should take sennakot every night to reduce the likelihood of constipation. If this causes diarrhea, stop its use.  Diet: 1. Low sodium Heart Healthy Diet is recommended.  2. It is safe to use a laxative if you have difficulty moving your bowels.   Wound Care: 1. Keep clean and dry.  Shower daily. No baths or hot tubs until you have healed.  Reasons to call the Doctor:   Fever - Oral temperature greater than 100.4 degrees Fahrenheit  Foul-smelling vaginal discharge  Difficulty urinating  Nausea and vomiting  Increased pain at the site of the incision that is unrelieved with pain medicine.  Difficulty breathing with or without chest pain  New calf pain especially if only on one side  Sudden, continuing increased vaginal bleeding with or without clots.   Follow-up: 1. See Everitt Amber in 4 weeks.  Contacts: For questions or concerns you should contact:  Dr. Everitt Amber at [phone] After hours and on week-ends call [phone] and ask to speak to the physician on call for Gynecologic Oncology   Unilateral  Salpingo-Oophorectomy, Care After This sheet gives you information about how to care for yourself after your procedure. Your health care provider may also give you more specific instructions. If you have problems or questions, contact your health care provider. What can I expect after the procedure? After the procedure, it is common to have:  Abdominal pain.  Some occasional vaginal bleeding (spotting).  Tiredness. Follow these instructions at home: Incision care  Keep your incision area and your bandage (dressing) clean and dry.  Follow instructions from your health care provider about how to take care of your incision. Make sure you: ? Wash your hands with soap and water before you change your dressing. If soap and water are not available, use hand sanitizer. ? Change your dressing as told by your health care provider. ? Leave stitches (sutures), staples, skin glue, or adhesive strips in place. These skin closures may need to stay in place for 2 weeks or longer. If adhesive strip edges start to loosen and curl up, you may trim the loose edges. Do not remove adhesive strips completely unless your health care provider tells you to do that.  Check your incision area every day for signs of infection. Check for: ? Redness, swelling, or pain. ? Fluid or blood. ? Warmth. ? Pus or a bad smell.   Activity  Do not drive or use heavy machinery while taking prescription pain  medicine.  Do not drive for 24 hours if you received a medicine to help you relax (sedative).  Take frequent, short walks throughout the day. Rest when you get tired. Ask your health care provider what activities are safe for you.  Avoid activities that require great effort. Also, avoid heavy lifting. Do not lift anything that is heavier than 5 lb (2.3 kg), or the limit that your health care provider tells you, until he or she says that it is safe to do so.  Do not douche, use tampons, or have sex until your health care  provider approves. General instructions  To prevent or treat constipation while you are taking prescription pain medicine, your health care provider may recommend that you: ? Drink enough fluid to keep your urine pale yellow. ? Take over-the-counter or prescription medicines. ? Eat foods that are high in fiber, such as fresh fruits and vegetables, whole grains, and beans. ? Limit foods that are high in fat and processed sugars, such as fried and sweet foods.  Take over-the-counter and prescription medicines only as told by your health care provider.  Do not take baths, swim, or use a hot tub until your health care provider approves. Ask your health care provider if you may take showers. You may only be allowed to take sponge baths.  Wear compression stockings as told by your health care provider. These stockings help to prevent blood clots and reduce swelling in your legs.  Keep all follow-up visits as told by your health care provider. This is important. Contact a health care provider if:  You have pain when you urinate.  You have pus or a bad smelling discharge coming from your vagina.  You have redness, swelling, or pain around your incision.  You have fluid or blood coming from your incision.  Your incision feels warm to the touch.  You have pus or a bad smell coming from your incision.  You have a fever.  Your incision starts to break open.  You have abdominal pain that gets worse or does not get better with medicine.  You develop a rash.  You develop nausea and vomiting.  You feel lightheaded. Get help right away if:  You develop pain in your chest or leg.  You develop shortness of breath.  You faint.  You have increased bleeding from your vagina. Summary  After the procedure, it is common to have pain, tiredness, and occasional bleeding from the vagina.  Follow instructions from your health care provider about how to take care of your incision.  Check  your incision every day for signs of infection and report any symptoms to your health care provider.  Follow instructions from your health care provider about activities and restrictions. This information is not intended to replace advice given to you by your health care provider. Make sure you discuss any questions you have with your health care provider. Document Revised: 05/24/2017 Document Reviewed: 09/20/2016 Elsevier Patient Education  2021 [employer] Anesthesia, Adult, Care After This sheet gives you information about how to care for yourself after your procedure. Your health care provider may also give you more specific instructions. If you have problems or questions, contact your health care provider. What can I expect after the procedure? After the procedure, the following side effects are common:  Pain or discomfort at the IV site.  Nausea.  Vomiting.  Sore throat.  Trouble concentrating.  Feeling cold or chills.  Feeling weak or  tired.  Sleepiness and fatigue.  Soreness and body aches. These side effects can affect parts of the body that were not involved in surgery. Follow these instructions at home: For the time period you were told by your health care provider:  Rest.  Do not participate in activities where you could fall or become injured.  Do not drive or use machinery.  Do not drink alcohol.  Do not take sleeping pills or medicines that cause drowsiness.  Do not make important decisions or sign legal documents.  Do not take care of children on your own.   Eating and drinking  Follow any instructions from your health care provider about eating or drinking restrictions.  When you feel hungry, start by eating small amounts of foods that are soft and easy to digest (bland), such as toast. Gradually return to your regular diet.  Drink enough fluid to keep your urine pale yellow.  If you vomit, rehydrate by drinking water, juice, or  clear broth. General instructions  If you have sleep apnea, surgery and certain medicines can increase your risk for breathing problems. Follow instructions from your health care provider about wearing your sleep device: ? Anytime you are sleeping, including during daytime naps. ? While taking prescription pain medicines, sleeping medicines, or medicines that make you drowsy.  Have a responsible adult stay with you for the time you are told. It is important to have someone help care for you until you are awake and alert.  Return to your normal activities as told by your health care provider. Ask your health care provider what activities are safe for you.  Take over-the-counter and prescription medicines only as told by your health care provider.  If you smoke, do not smoke without supervision.  Keep all follow-up visits as told by your health care provider. This is important. Contact a health care provider if:  You have nausea or vomiting that does not get better with medicine.  You cannot eat or drink without vomiting.  You have pain that does not get better with medicine.  You are unable to pass urine.  You develop a skin rash.  You have a fever.  You have redness around your IV site that gets worse. Get help right away if:  You have difficulty breathing.  You have chest pain.  You have blood in your urine or stool, or you vomit blood. Summary  After the procedure, it is common to have a sore throat or nausea. It is also common to feel tired.  Have a responsible adult stay with you for the time you are told. It is important to have someone help care for you until you are awake and alert.  When you feel hungry, start by eating small amounts of foods that are soft and easy to digest (bland), such as toast. Gradually return to your regular diet.  Drink enough fluid to keep your urine pale yellow.  Return to your normal activities as told by your health care provider.  Ask your health care provider what activities are safe for you. This information is not intended to replace advice given to you by your health care provider. Make sure you discuss any questions you have with your health care provider. Document Revised: 02/25/2020 Document Reviewed: 09/24/2019 Elsevier Patient Education  2021 [employer].

## 2020-07-05 NOTE — Anesthesia Postprocedure Evaluation (Signed)
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  Procedure(s) Performed: XI ROBOTIC ASSISTED RIGHT SALPINGO OOPHORECTOMY WITH MINI LAPAROTOMY AND LYSIS OF ADHESIONS (Bilateral )     Patient location during evaluation: PACU Anesthesia Type: General Level of consciousness: awake and alert Pain management: pain level controlled Vital Signs Assessment: post-procedure vital signs reviewed and stable Respiratory status: spontaneous breathing, nonlabored ventilation, respiratory function stable and patient connected to nasal cannula oxygen Cardiovascular status: blood pressure returned to baseline and stable Postop Assessment: no apparent nausea or vomiting Anesthetic complications: no   No complications documented.  Last Vitals:  Vitals:   07/05/20 1430 07/05/20 1445  BP: 103/67 117/60  Pulse: 60 61  Resp: 12 12  Temp:    SpO2: 94% 93%    Last Pain:  Vitals:   07/05/20 1445  TempSrc:   PainSc: 0-No pain                 Darcel Zick S

## 2020-07-06 ENCOUNTER — Encounter (HOSPITAL_COMMUNITY): Payer: Self-pay | Admitting: Gynecologic Oncology

## 2020-07-06 ENCOUNTER — Telehealth: Payer: Self-pay

## 2020-07-06 NOTE — Telephone Encounter (Signed)
Ms. Hornbaker' daughter [name] states that her mother is eating,drinking, and urinating well. Passing gas.She had a BM last night. She has not started the senokot-s.  Told daughter to have her mother take it tomorrow am if no BM in am. Afebrile. Incisions D&I. Told daughter that the honeycomb dressing can be removed 5 days after surgery = 1-16 or 1-17. She can resume her ASA 325 mg 7 days after surgery =07-13-20 Daughter [name]  aware of post op appointments as well as the office number [phone] and after hours number [phone] to call if she has any questions or concerns

## 2020-07-07 LAB — SURGICAL PATHOLOGY

## 2020-07-07 LAB — CYTOLOGY - NON PAP

## 2020-07-29 ENCOUNTER — Encounter: Payer: Self-pay | Admitting: Gynecologic Oncology

## 2020-08-01 ENCOUNTER — Other Ambulatory Visit: Payer: Self-pay

## 2020-08-01 ENCOUNTER — Inpatient Hospital Stay: Payer: 59 | Attending: Gynecologic Oncology | Admitting: Gynecologic Oncology

## 2020-08-01 VITALS — BP 126/55 | HR 68 | Temp 96.8°F | Resp 18 | Ht 64.0 in | Wt 210.0 lb

## 2020-08-01 DIAGNOSIS — N83209 Unspecified ovarian cyst, unspecified side: Secondary | ICD-10-CM | POA: Diagnosis present

## 2020-08-01 DIAGNOSIS — Z90722 Acquired absence of ovaries, bilateral: Secondary | ICD-10-CM | POA: Diagnosis not present

## 2020-08-01 NOTE — Progress Notes (Signed)
Follow-up Note: Gyn-Onc  Consult was requested by Dr. Elonda Husky for the evaluation of Jean Watts 80 y.o. female  CC:  Chief Complaint  Patient presents with  . Cyst of ovary, unspecified laterality    Assessment/Plan:  Jean. Iysis Watts  is a 80 y.o.  year old with a history of a 16cm ovarian cyst s/p robotic assisted right salpingo-oophorectomy on July 05, 2020.  She is doing well post-op. I am recommending continuing annual routine follow-up with Dr Elonda Husky for well-woman visits.  HPI: Jean Watts is a 80 year old P1 who was seen in consultation at the request of Dr Elonda Husky for evaluation of a 16cm cystic pelvic mass.  The patient was admitted to [facility] on 04/30/2020 for pyelonephritis and urosepsis.  As part of her work-up she received a CT scan of the abdomen and pelvis on 05/03/2020 which revealed mild bilateral hydronephrosis likely secondary to compression from a distal pelvic mass.  There was a 16 x 13 x 11 cm predominantly cystic lesion with probable areas of septation in the pelvis.  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  Pelvic ultrasound scan was performed on 05/01/2020.  It demonstrated a surgically absent uterus.  There was a 14.8 x 13.4 x 11 cm complex mass noted in the pelvis.  There are multiple septations and probable solid components in the pelvis. Ca1 25 was drawn on 05/02/2020 and was normal at 6.  Hemoglobin A1c was drawn on 04/30/2020 and was mildly elevated at 7.  Interval Hx:  On 07/05/20 she underwent robotic assisted RSO, lysis of adhesions, minilaparotomy. Intraoperative findings were significant for dense adhesions on the anterior abdominal wall from the omentum.  The omentum ileum and sigmoid mesentery was densely adherent to an ovarian cyst that was 20 cm.  There was unavoidable simple cyst rupture due to dense adhesions.  The cyst was filled with mucinous fluid.  There was normal upper abdomen.  The left ovary was  surgically absent.  There was a grossly normal appendix. 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  Since surgery she has done well with no specific complaints other than brown-colored urine but denies vaginal bleeding.   Current Meds:  Outpatient Encounter Medications as of 08/01/2020  Medication Sig  . ACCU-CHEK AVIVA PLUS test strip   . acetaminophen (TYLENOL) 500 MG tablet Take 500 mg by mouth every 6 (six) hours as needed for moderate pain.  Marland Kitchen albuterol (PROVENTIL) (2.5 MG/3ML) 0.083% nebulizer solution Inhale 2.5 mg into the lungs every 6 (six) hours as needed for wheezing or shortness of breath.   Marland Kitchen amitriptyline (ELAVIL) 50 MG tablet Take 50 mg by mouth at bedtime.   Marland Kitchen amLODipine-benazepril (LOTREL) 5-10 MG capsule Take 1 capsule by mouth daily.  Marland Kitchen aspirin 325 MG tablet Take 1 tablet (325 mg total) by mouth daily with breakfast.  . BD INSULIN SYRINGE U/F 31G X 5/16" 1 ML MISC 2 (two) times daily. as directed  . gabapentin (NEURONTIN) 400 MG capsule Take 400 mg by mouth 3 (three) times daily.  Marland Kitchen HUMULIN N 100 UNIT/ML injection Inject 0.15-0.35 mLs (15-35 Units total) into the skin See admin instructions. Inject 35 units into the skin in the morning and 15 units in the evening (Patient taking differently: Inject 20-40 Units into the skin See admin instructions. Inject 40 units into the skin in the morning  and 20 units in the evening)  . meclizine (ANTIVERT) 25 MG tablet Take 25 mg by mouth 3 (three) times daily as needed for dizziness.   . metFORMIN (GLUCOPHAGE) 1000 MG tablet Take 1,000 mg by mouth 2 (two) times daily.   . metoprolol tartrate (LOPRESSOR) 25 MG tablet Take 25 mg by mouth 2 (two) times daily.  . RESTASIS 0.05 % ophthalmic emulsion Place 1 drop into both eyes 2 (two) times daily.   . [DISCONTINUED] senna-docusate (SENOKOT-S)  8.6-50 MG tablet Take 2 tablets by mouth at bedtime. For AFTER surgery, do not take if having diarrhea  . [DISCONTINUED] traMADol (ULTRAM) 50 MG tablet Take 1 tablet (50 mg total) by mouth every 6 (six) hours as needed for severe pain. For AFTER surgery, do not take and drive   No facility-administered encounter medications on file as of 08/01/2020.    Allergy: No Known Allergies  Social Hx:   Social History   Socioeconomic History  . Marital status: Widowed    Spouse name: Not on file  . Number of children: Not on file  . Years of education: Not on file  . Highest education level: Not on file  Occupational History  . Not on file  Tobacco Use  . Smoking status: Never Smoker  . Smokeless tobacco: Never Used  Vaping Use  . Vaping Use: Never used  Substance and Sexual Activity  . Alcohol use: Never  . Drug use: Never  . Sexual activity: Not Currently    Birth control/protection: Surgical    Comment: Hysterectomy  Other Topics Concern  . Not on file  Social History Narrative  . Not on file   Social Determinants of Health   Financial Resource Strain: Not on file  Food Insecurity: Not on file  Transportation Needs: Not on file  Physical Activity: Not on file  Stress: Not on file  Social Connections: Not on file  Intimate Partner Violence: Not on file    Past Surgical Hx:  Past Surgical History:  Procedure Laterality Date  . ABDOMINAL HYSTERECTOMY    . BREAST BIOPSY  02/2019  . BREAST LUMPECTOMY WITH RADIOACTIVE SEED LOCALIZATION Right 12/22/2019   Procedure: RIGHT BREAST LUMPECTOMY X 2 WITH RADIOACTIVE SEED LOCALIZATION;  Surgeon: Coralie Keens, MD;  Location: [place];  Service: General;  Laterality: Right;  . FLEXIBLE SIGMOIDOSCOPY N/A 06/04/2019   Procedure: FLEXIBLE SIGMOIDOSCOPY;  Surgeon: Daneil Dolin, MD;  [facility];  Service: Endoscopy;  Laterality: N/A;  incomplete colonoscopy, poor prep  . IR CT HEAD LTD  08/04/2019  . IR  PERCUTANEOUS ART THROMBECTOMY/INFUSION INTRACRANIAL INC DIAG ANGIO  08/04/2019  . LOOP RECORDER INSERTION N/A 08/06/2019   Procedure: LOOP RECORDER INSERTION;  Surgeon: Thompson Grayer, MD;  Location: [place] [facility];  Service: Cardiovascular;  Laterality: N/A;  . RADIOLOGY WITH ANESTHESIA N/A 08/04/2019   Procedure: IR WITH ANESTHESIA;  Surgeon: Radiologist, Medication, MD;  Location: [place];  Service: Radiology;  Laterality: N/A;  . ROBOTIC ASSISTED BILATERAL SALPINGO OOPHERECTOMY Bilateral 07/05/2020   Procedure: XI ROBOTIC ASSISTED RIGHT SALPINGO OOPHORECTOMY WITH MINI LAPAROTOMY AND LYSIS OF ADHESIONS;  Surgeon: Everitt Amber, MD;  Location: [facility];  Service: Gynecology;  Laterality: Bilateral;    Past Medical Hx:  Past Medical History:  Diagnosis Date  . Arthritis   . Dyspnea   . Headache   . HLD (hyperlipidemia)   . HTN (hypertension)   . Neuropathy   . Stroke [facility])    no residual   .  Type 2 diabetes mellitus with diabetic polyneuropathy ([place])   . Visual loss    right eye    Past Gynecological History:  See HPI No LMP recorded. Patient has had a hysterectomy.  Family Hx:  Family History  Problem Relation Age of Onset  . Cancer Mother   . Heart attack Father   . Prostate cancer Brother   . Lung cancer Brother   . Cancer Daughter 76       kidney  . Breast cancer Daughter 25  . Colon cancer Neg Hx   . Colon polyps Neg Hx     Review of Systems:  Constitutional  Feels well,    ENT Normal appearing ears and nares bilaterally Skin/Breast  No rash, sores, jaundice, itching, dryness Cardiovascular  No chest pain, shortness of breath, or edema  Pulmonary  No cough or wheeze.  Gastro Intestinal  No nausea, vomitting, or diarrhoea. No bright red blood per rectum, no abdominal pain, change in bowel movement, + constipation.  Genito Urinary  No frequency, urgency, dysuria,  Musculo Skeletal  No myalgia, arthralgia, joint swelling or pain  Neurologic  No weakness,  numbness, change in gait,  Psychology  No depression, anxiety, insomnia.   Vitals:  Blood pressure (!) 126/55, pulse 68, temperature (!) 96.8 F (36 C), temperature source Tympanic, resp. rate 18, height 5\' 4"  (1.626 m), weight 210 lb (95.3 kg), SpO2 100 %.  Physical Exam: WD in NAD Neck  Supple NROM, without any enlargements.  Lymph Node Survey No cervical supraclavicular or inguinal adenopathy Cardiovascular  Pulse normal rate, regularity and rhythm. S1 and S2 normal.  Lungs  Clear to auscultation bilateraly, without wheezes/crackles/rhonchi. Good air movement.  Skin  No rash/lesions/breakdown  Psychiatry  Alert and oriented to person, place, and time  Abdomen  Normoactive bowel sounds, abdomen soft, non-tender and obese without evidence of hernia. Incision well-healed.  Back No CVA tenderness Genito Urinary  No blood in the vagina urethra appears normal no tenderness on the bladder to palpation vaginally Rectal  deferred Extremities  No bilateral cyanosis, clubbing or edema.   Thereasa Solo, MD  08/01/2020, 2:51 PM

## 2020-08-01 NOTE — Patient Instructions (Signed)
You have healed well from your surgery and no longer have restrictions on your activities.  Dr. Denman George recommends hydrating well to change the color of your urine to a lighter yellow.  Please follow-up with your primary care doctor, Dr. Legrand Rams, should you continue to have abnormal urine.  You no longer require routine gynecologic care as you have had old gynecologic organs removed as part of this and prior surgeries.  Should you have questions regarding your prior surgery with Dr. Denman George, please call her on [phone].

## 2021-01-02 ENCOUNTER — Encounter (HOSPITAL_COMMUNITY): Payer: Self-pay

## 2021-06-30 IMAGING — MG MM PLC BREAST LOC DEV 1ST LESION INC*R*
8 of 9 series · 8 of 9 positions shown · non-contrast
Comparison: Previous exam(s).

CLINICAL DATA: 79-year-old with 2 biopsy-proven high risk
papillomas/papillary lesions involving the RIGHT breast. Radioactive
seed localization is performed in anticipation of excisional biopsy.

EXAM:
MAMMOGRAPHIC GUIDED RADIOACTIVE SEED LOCALIZATION OF THE RIGHT
BREAST x 2

[R ML (1 of 5)]
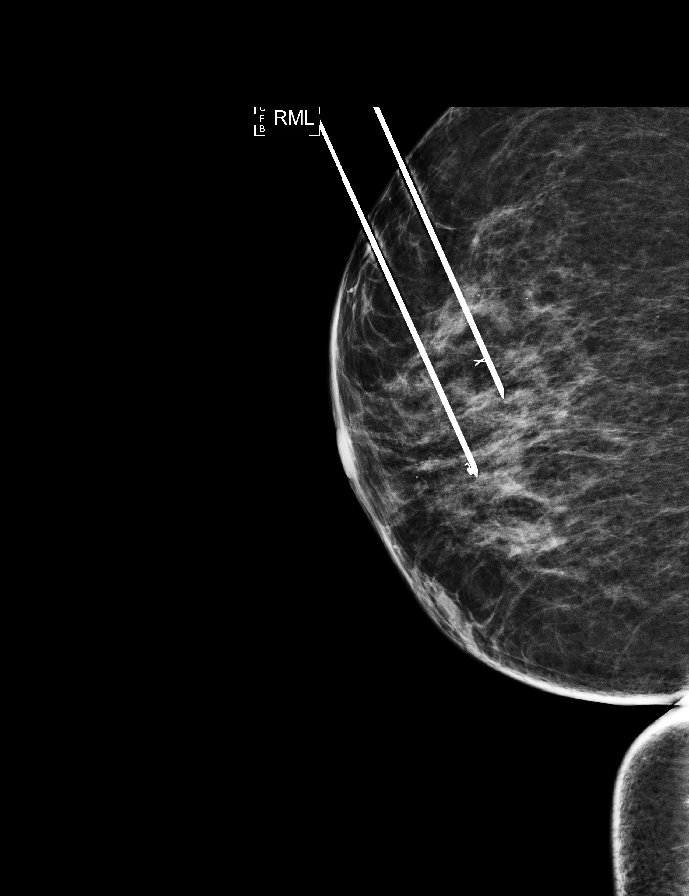

[R ML (2 of 5)]
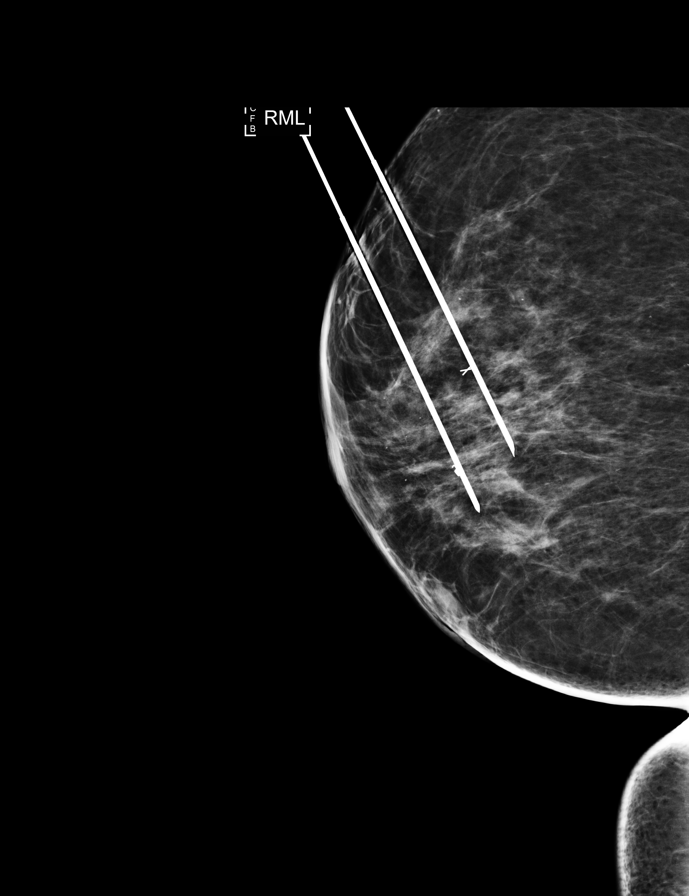

[R CC (1 of 3)]
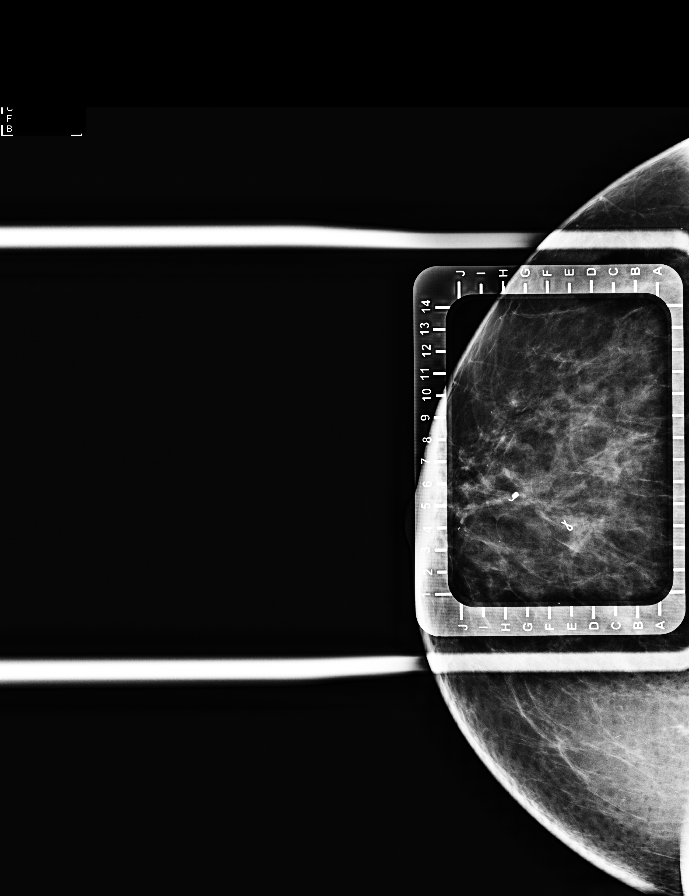

[R CC (2 of 3)]
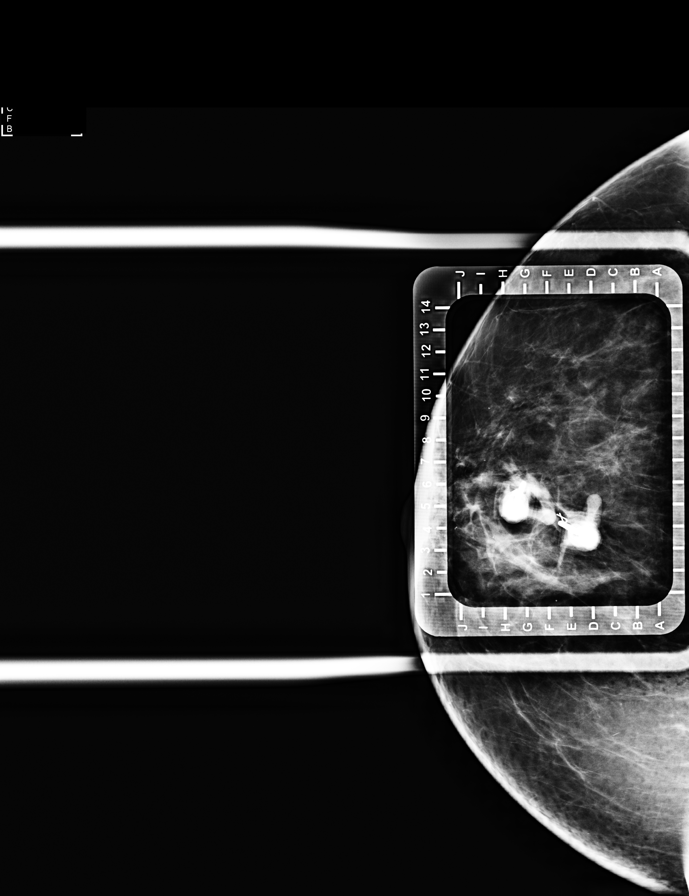

[R ML (3 of 5)]
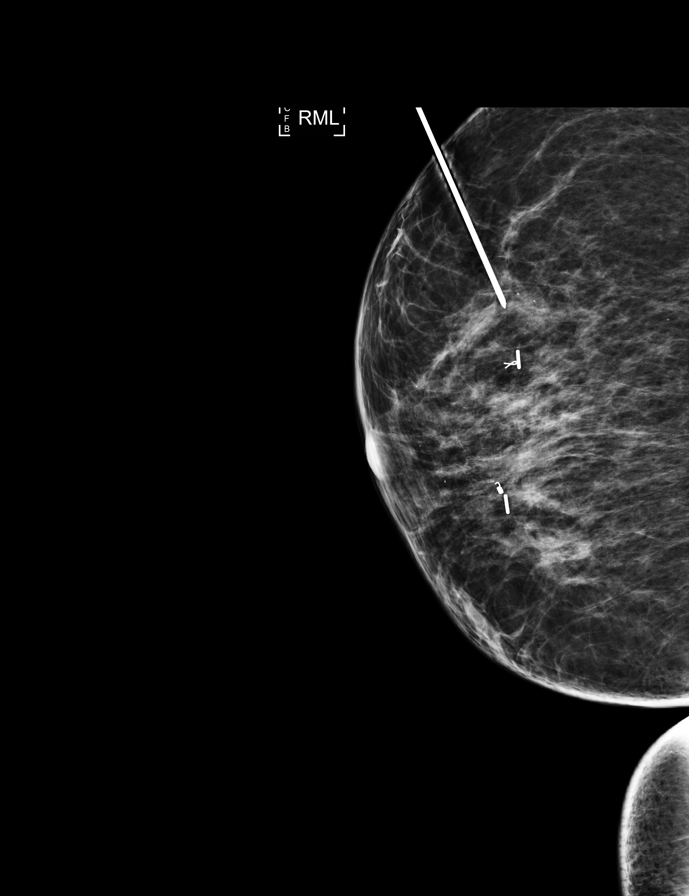

[R CC (3 of 3)]
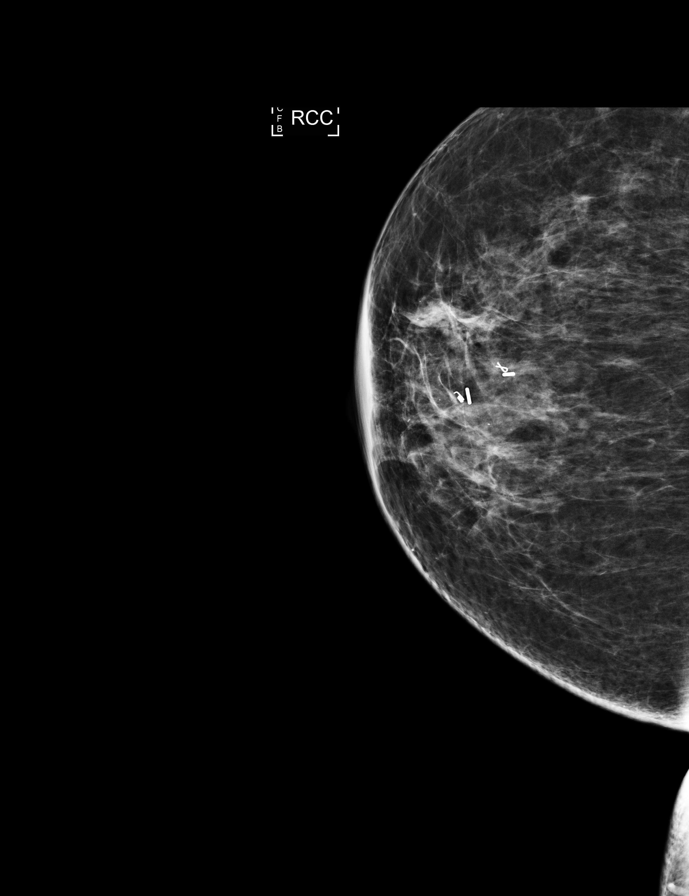

[R ML (4 of 5)]
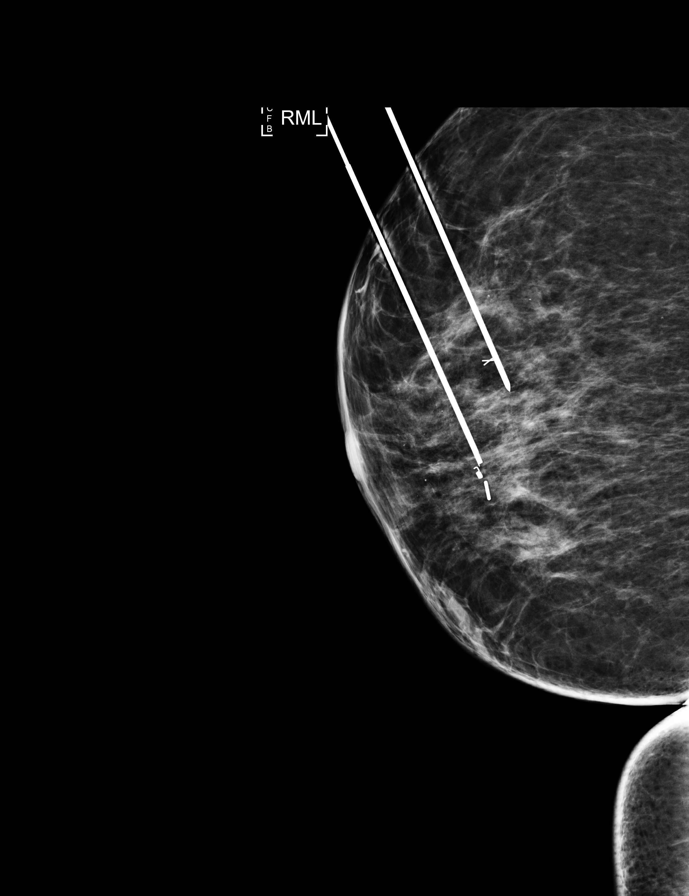

[R ML (5 of 5)]
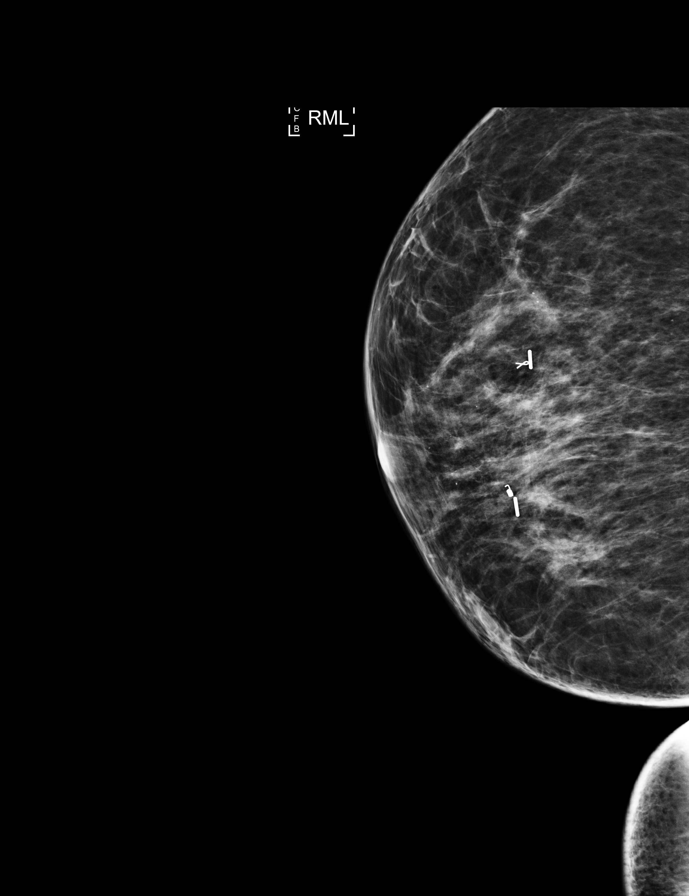

[8 of 9 positions shown; findings below may reference images not displayed]

FINDINGS: Patient presents for radioactive seed localization prior to
excisional biopsy of 2 high risk papillomas in the RIGHT breast. I
met with the patient and we discussed the procedure of seed
localization including benefits and alternatives. We discussed the
high likelihood of a successful procedure. We discussed the risks of
the procedure including infection, bleeding, tissue injury and
further surgery. We discussed the low dose of radioactivity involved
in the procedure. Informed, written consent was given.

The usual time-out protocol was performed immediately prior to the
procedure.

Using mammographic guidance, sterile technique with chlorhexidine as
skin antisepsis, 1% lidocaine as local anesthetic, T-ECA radioactive
seeds were used to localize the 2 biopsy-proven high risk papillomas
in the RIGHT breast using a superior approach.

The follow-up mammogram images confirm that the seeds are
appropriately positioned adjacent to the ribbon shaped tissue marker
clip in the UPPER breast at ANTERIOR depth and adjacent to the coil
shaped tissue marker clip in the LOWER breast at ANTERIOR depth. The
seeds are approximately 3 cm apart. The images are marked for Dr.
Katelin.

Follow-up survey of the patient confirms the presence of the
radioactive seeds.

Order number of both T-ECA seeds: [id number]

Total activity: 0.247 mCi for each seed

Reference Date: 11/19/2019 for each seed

The patient tolerated the procedure well and was released from the
[REDACTED]. She was given instructions regarding seed removal.
IMPRESSION: Radioactive seed localization of 2 biopsy-proven
papillomas/papillary lesions in the RIGHT breast. No apparent
complications.

The seeds are approximately 3 cm apart.

## 2021-10-10 ENCOUNTER — Other Ambulatory Visit: Payer: Self-pay | Admitting: Gerontology

## 2021-10-10 ENCOUNTER — Other Ambulatory Visit (HOSPITAL_COMMUNITY): Payer: Self-pay | Admitting: Gerontology

## 2021-10-10 ENCOUNTER — Ambulatory Visit (HOSPITAL_COMMUNITY)
Admission: RE | Admit: 2021-10-10 | Discharge: 2021-10-10 | Disposition: A | Discharge status: Home / Self Care | Payer: 59 | Source: Ambulatory Visit | Attending: Gerontology | Admitting: Gerontology

## 2021-10-10 DIAGNOSIS — R296 Repeated falls: Secondary | ICD-10-CM

## 2021-11-03 ENCOUNTER — Ambulatory Visit (INDEPENDENT_AMBULATORY_CARE_PROVIDER_SITE_OTHER): Payer: 59 | Admitting: Internal Medicine

## 2021-11-03 ENCOUNTER — Encounter: Payer: Self-pay | Admitting: Internal Medicine

## 2021-11-03 VITALS — BP 118/60 | HR 84 | Ht 64.0 in | Wt 214.0 lb

## 2021-11-03 DIAGNOSIS — I639 Cerebral infarction, unspecified: Secondary | ICD-10-CM | POA: Diagnosis not present

## 2021-11-03 DIAGNOSIS — I10 Essential (primary) hypertension: Secondary | ICD-10-CM | POA: Diagnosis not present

## 2021-11-03 NOTE — Patient Instructions (Addendum)
Medication Instructions:  ?Your physician recommends that you continue on your current medications as directed. Please refer to the Current Medication list given to you today. ? ?Labwork: ?none ? ?Testing/Procedures: ?none ? ?Follow-Up: ?Your physician recommends that you schedule a follow-up appointment in: as needed ? ?Any Other Special Instructions Will Be Listed Below (If Applicable). ?Your Loop Recorder will be followed remotely ? ?If you need a refill on your cardiac medications before your next appointment, please call your pharmacy. ?

## 2021-11-03 NOTE — Progress Notes (Signed)
? ?PCP: Carrolyn Meiers, MD ?  ?Primary EP: Dr Rayann Heman ? ?Jean Watts is a 81 y.o. female who presents today for routine electrophysiology followup.  Since last being seen in our clinic, the patient reports doing reasonably well.  She is not very active.  She has recovered from her stroke.   Today, she denies symptoms of palpitations, exertional chest pain, shortness of breath,  lower extremity edema, dizziness, presyncope, or syncope.  She has rare fleeting sternal pain (lasting seconds).  The patient is otherwise without complaint today.  ? ?Past Medical History:  ?Diagnosis Date  ? Arthritis   ? Dyspnea   ? Headache   ? HLD (hyperlipidemia)   ? HTN (hypertension)   ? Neuropathy   ? Stroke [facility])   ? no residual   ? Type 2 diabetes mellitus with diabetic polyneuropathy ([place])   ? Visual loss   ? right eye  ? ?Past Surgical History:  ?Procedure Laterality Date  ? ABDOMINAL HYSTERECTOMY    ? BREAST BIOPSY  02/2019  ? BREAST LUMPECTOMY WITH RADIOACTIVE SEED LOCALIZATION Right 12/22/2019  ? Procedure: RIGHT BREAST LUMPECTOMY X 2 WITH RADIOACTIVE SEED LOCALIZATION;  Surgeon: Coralie Keens, MD;  Location: [place];  Service: General;  Laterality: Right;  ? FLEXIBLE SIGMOIDOSCOPY N/A 06/04/2019  ? Procedure: FLEXIBLE SIGMOIDOSCOPY;  Surgeon: Daneil Dolin, MD;  [facility];  Service: Endoscopy;  Laterality: N/A;  incomplete colonoscopy, poor prep  ? IR CT HEAD LTD  08/04/2019  ? IR PERCUTANEOUS ART THROMBECTOMY/INFUSION INTRACRANIAL INC DIAG ANGIO  08/04/2019  ? LOOP RECORDER INSERTION N/A 08/06/2019  ? Procedure: LOOP RECORDER INSERTION;  Surgeon: Thompson Grayer, MD;  Location: [place] [facility];  Service: Cardiovascular;  Laterality: N/A;  ? RADIOLOGY WITH ANESTHESIA N/A 08/04/2019  ? Procedure: IR WITH ANESTHESIA;  Surgeon: Radiologist, Medication, MD;  Location: [place];  Service: Radiology;  Laterality: N/A;  ? ROBOTIC ASSISTED BILATERAL SALPINGO OOPHERECTOMY Bilateral 07/05/2020  ?  Procedure: XI ROBOTIC ASSISTED RIGHT SALPINGO OOPHORECTOMY WITH MINI LAPAROTOMY AND LYSIS OF ADHESIONS;  Surgeon: Everitt Amber, MD;  [facility];  Service: Gynecology;  Laterality: Bilateral;  ? ? ?ROS- all systems are reviewed and negatives except as per HPI above ? ?Current Outpatient Medications  ?Medication Sig Dispense Refill  ? ACCU-CHEK AVIVA PLUS test strip     ? acetaminophen (TYLENOL) 500 MG tablet Take 500 mg by mouth every 6 (six) hours as needed for moderate pain.    ? albuterol (PROVENTIL) (2.5 MG/3ML) 0.083% nebulizer solution Inhale 2.5 mg into the lungs every 6 (six) hours as needed for wheezing or shortness of breath.     ? amitriptyline (ELAVIL) 50 MG tablet Take 50 mg by mouth at bedtime.     ? amLODipine-benazepril (LOTREL) 5-10 MG capsule Take 1 capsule by mouth daily. 30 capsule 3  ? aspirin 325 MG tablet Take 1 tablet (325 mg total) by mouth daily with breakfast. 120 tablet 3  ? BD INSULIN SYRINGE U/F 31G X 5/16" 1 ML MISC 2 (two) times daily. as directed    ? gabapentin (NEURONTIN) 400 MG capsule Take 400 mg by mouth 3 (three) times daily.    ? HUMULIN N 100 UNIT/ML injection Inject 0.15-0.35 mLs (15-35 Units total) into the skin See admin instructions. Inject 35 units into the skin in the morning and 15 units in the evening (Patient taking differently: Inject 20-40 Units into the skin See admin instructions. Inject 40 units into the skin in  the morning and 20 units in the evening) 10 mL 11  ? meclizine (ANTIVERT) 25 MG tablet Take 25 mg by mouth 3 (three) times daily as needed for dizziness.     ? metFORMIN (GLUCOPHAGE) 1000 MG tablet Take 1,000 mg by mouth 2 (two) times daily.     ? metoprolol tartrate (LOPRESSOR) 25 MG tablet Take 25 mg by mouth 2 (two) times daily.    ? RESTASIS 0.05 % ophthalmic emulsion Place 1 drop into both eyes 2 (two) times daily.     ? ?No current facility-administered medications for this visit.  ? ? ?Physical Exam: ?Vitals:  ? 11/03/21 1410  ?BP: 118/60   ?Pulse: 84  ?SpO2: 98%  ?Weight: 214 lb (97.1 kg)  ?Height: '5\' 4"'$  (1.626 m)  ? ? ?GEN- The patient is well appearing, alert and oriented x 3 today.   ?Head- normocephalic, atraumatic ?Eyes-  R surgical pupil ?Ears- hearing intact ?Oropharynx- clear ?Lungs- Clear to ausculation bilaterally, normal work of breathing ?Heart- Regular rate and rhythm, no murmurs, rubs or gallops, PMI not laterally displaced ?GI- soft, NT, ND, + BS ?Extremities- no clubbing, cyanosis, or edema ? ?Wt Readings from Last 3 Encounters:  ?11/03/21 214 lb (97.1 kg)  ?08/01/20 210 lb (95.3 kg)  ?07/05/20 210 lb (95.3 kg)  ?  ? ?Assessment and Plan: ? ?Prior cryptogenic stroke ?ILR interrogated today and normal ?Importance of compliance with remote monitoring was stressed today.  We have requested a new transmitter from [facility]. ?Continue ASA ? ?2. HTN ?Stable ?No change required today ? ?3. HL ?Stable ?No change required today ? ?We will remotely monitor and see in the office prn ? ?Thompson Grayer MD, [facility] ?11/03/2021 ?2:21 PM ? ? ? ? ?

## 2021-11-08 LAB — CUP PACEART INCLINIC DEVICE CHECK
Date Time Interrogation Session: 20230517120343
Implantable Pulse Generator Implant Date: 20210211

## 2021-12-06 ENCOUNTER — Ambulatory Visit: Payer: 59

## 2022-01-08 ENCOUNTER — Ambulatory Visit (INDEPENDENT_AMBULATORY_CARE_PROVIDER_SITE_OTHER): Payer: 59

## 2022-01-08 DIAGNOSIS — I639 Cerebral infarction, unspecified: Secondary | ICD-10-CM

## 2022-01-08 LAB — CUP PACEART REMOTE DEVICE CHECK
Date Time Interrogation Session: 20230716231735
Implantable Pulse Generator Implant Date: 20210211

## 2022-01-19 ENCOUNTER — Emergency Department (HOSPITAL_COMMUNITY)
Admission: EM | Admit: 2022-01-19 | Discharge: 2022-01-19 | Disposition: A | Discharge status: Home / Self Care | Payer: 59 | Attending: Emergency Medicine | Admitting: Emergency Medicine

## 2022-01-19 ENCOUNTER — Emergency Department (HOSPITAL_COMMUNITY): Payer: 59

## 2022-01-19 ENCOUNTER — Encounter (HOSPITAL_COMMUNITY): Payer: Self-pay | Admitting: *Deleted

## 2022-01-19 ENCOUNTER — Other Ambulatory Visit: Payer: Self-pay

## 2022-01-19 DIAGNOSIS — Z7982 Long term (current) use of aspirin: Secondary | ICD-10-CM | POA: Insufficient documentation

## 2022-01-19 DIAGNOSIS — S0990XA Unspecified injury of head, initial encounter: Secondary | ICD-10-CM | POA: Diagnosis present

## 2022-01-19 DIAGNOSIS — I10 Essential (primary) hypertension: Secondary | ICD-10-CM | POA: Diagnosis not present

## 2022-01-19 DIAGNOSIS — H547 Unspecified visual loss: Secondary | ICD-10-CM | POA: Insufficient documentation

## 2022-01-19 DIAGNOSIS — E119 Type 2 diabetes mellitus without complications: Secondary | ICD-10-CM | POA: Diagnosis not present

## 2022-01-19 DIAGNOSIS — Z794 Long term (current) use of insulin: Secondary | ICD-10-CM | POA: Insufficient documentation

## 2022-01-19 DIAGNOSIS — Z79899 Other long term (current) drug therapy: Secondary | ICD-10-CM | POA: Diagnosis not present

## 2022-01-19 DIAGNOSIS — Z7984 Long term (current) use of oral hypoglycemic drugs: Secondary | ICD-10-CM | POA: Insufficient documentation

## 2022-01-19 DIAGNOSIS — W01198A Fall on same level from slipping, tripping and stumbling with subsequent striking against other object, initial encounter: Secondary | ICD-10-CM | POA: Diagnosis not present

## 2022-01-19 DIAGNOSIS — W19XXXA Unspecified fall, initial encounter: Secondary | ICD-10-CM

## 2022-01-19 LAB — URINALYSIS, ROUTINE W REFLEX MICROSCOPIC
Bilirubin Urine: NEGATIVE
Glucose, UA: NEGATIVE mg/dL
Hgb urine dipstick: NEGATIVE
Ketones, ur: NEGATIVE mg/dL
Leukocytes,Ua: NEGATIVE
Nitrite: NEGATIVE
Protein, ur: NEGATIVE mg/dL
Specific Gravity, Urine: 1.01 (ref 1.005–1.030)
pH: 7 (ref 5.0–8.0)

## 2022-01-19 LAB — COMPREHENSIVE METABOLIC PANEL
ALT: 15 U/L (ref 0–44)
AST: 16 U/L (ref 15–41)
Albumin: 3.7 g/dL (ref 3.5–5.0)
Alkaline Phosphatase: 120 U/L (ref 38–126)
Anion gap: 7 (ref 5–15)
BUN: 10 mg/dL (ref 8–23)
CO2: 23 mmol/L (ref 22–32)
Calcium: 8.8 mg/dL — ABNORMAL LOW (ref 8.9–10.3)
Chloride: 105 mmol/L (ref 98–111)
Creatinine, Ser: 0.94 mg/dL (ref 0.44–1.00)
GFR, Estimated: >60 mL/min (ref >60)
Glucose, Bld: 185 mg/dL — ABNORMAL HIGH (ref 70–99)
Potassium: 4.3 mmol/L (ref 3.5–5.1)
Sodium: 135 mmol/L (ref 135–145)
Total Bilirubin: 0.4 mg/dL (ref 0.3–1.2)
Total Protein: 7.1 g/dL (ref 6.5–8.1)

## 2022-01-19 LAB — CBC WITH DIFFERENTIAL/PLATELET
Abs Immature Granulocytes: 0.01 10*3/uL (ref 0.00–0.07)
Basophils Absolute: 0 10*3/uL (ref 0.0–0.1)
Basophils Relative: 1 %
Eosinophils Absolute: 0 10*3/uL (ref 0.0–0.5)
Eosinophils Relative: 0 %
HCT: 33.3 % — ABNORMAL LOW (ref 36.0–46.0)
Hemoglobin: 11 g/dL — ABNORMAL LOW (ref 12.0–15.0)
Immature Granulocytes: 0 %
Lymphocytes Relative: 8 %
Lymphs Abs: 0.4 10*3/uL — ABNORMAL LOW (ref 0.7–4.0)
MCH: 28 pg (ref 26.0–34.0)
MCHC: 33 g/dL (ref 30.0–36.0)
MCV: 84.7 fL (ref 80.0–100.0)
Monocytes Absolute: 0.3 10*3/uL (ref 0.1–1.0)
Monocytes Relative: 6 %
Neutro Abs: 4.5 10*3/uL (ref 1.7–7.7)
Neutrophils Relative %: 85 %
Platelets: 254 10*3/uL (ref 150–400)
RBC: 3.93 MIL/uL (ref 3.87–5.11)
RDW: 15.1 % (ref 11.5–15.5)
WBC: 5.2 10*3/uL (ref 4.0–10.5)
nRBC: 0 % (ref 0.0–0.2)

## 2022-01-19 NOTE — ED Triage Notes (Signed)
Pt states her foot slipped while getting in the bed and fell in the floor last night.  Denies hitting her head.  Pt ambulated to triage room with her cane from home. Denies any blood thinners.   Pt c/o right side pain.

## 2022-01-19 NOTE — Discharge Instructions (Addendum)
Your labs and imaging were all normal today.  Fortunately there is no signs of stroke, bleeding in the brain or fractures from your fall earlier today.  Your urine was also without signs of infection.  Please go home and get some rest, however if you notice changes in mental status or develop worsening symptoms, please return to the emergency department.

## 2022-01-19 NOTE — ED Provider Notes (Signed)
[facility] EMERGENCY DEPARTMENT Provider Note   CSN: [id number] Arrival date & time: 01/19/22  1139     History  Chief Complaint  Patient presents with   Jean Watts    Jean Watts is a 81 y.o. 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  Patient states that she felt that her legs just "gave out" and she fell backwards, hitting the back of her head on a table.  No loss of consciousness.  Daughter states that up until now, patient has been denying head injury. Patient's daughter states that she had altered mental status lasting for approximately 15 minutes and states patient was unsure of her own birthdate, what year it is and overall seemed confused.  Patient states that currently she feels fine and denies headache, new vision changes, chest pain, shortness of breath, abdominal pain, neck pain.  Patient is ambulatory with her cane.  Denies current blood thinner use.   Fall Pertinent negatives include no chest pain and no abdominal pain.       Home Medications Prior to Admission medications   Medication Sig Start Date End Date Taking? Authorizing Provider  ACCU-CHEK AVIVA PLUS test strip  05/09/20   [provider]  acetaminophen (TYLENOL) 500 MG tablet Take 500 mg by mouth every 6 (six) hours as needed for moderate pain.    [provider]  albuterol (PROVENTIL) (2.5 MG/3ML) 0.083% nebulizer solution Inhale 2.5 mg into the lungs every 6 (six) hours as needed for wheezing or shortness of breath.  01/29/19   [provider]  amitriptyline (ELAVIL) 50 MG tablet Take 50 mg by mouth at bedtime.  02/17/19   [provider]  amLODipine-benazepril (LOTREL) 5-10 MG capsule Take 1 capsule by mouth daily. 05/03/20   Roxan Hockey, MD  aspirin 325 MG tablet Take 1 tablet (325 mg  total) by mouth daily with breakfast. 05/03/20   Roxan Hockey, MD  BD INSULIN SYRINGE U/F 31G X 5/16" 1 ML MISC 2 (two) times daily. as directed 03/02/20   [provider]  gabapentin (NEURONTIN) 400 MG capsule Take 400 mg by mouth 3 (three) times daily.    [provider]  HUMULIN N 100 UNIT/ML injection Inject 0.15-0.35 mLs (15-35 Units total) into the skin See admin instructions. Inject 35 units into the skin in the morning and 15 units in the evening Patient taking differently: Inject 20-40 Units into the skin See admin instructions. Inject 40 units into the skin in the morning and 20 units in the evening 05/03/20   Roxan Hockey, MD  meclizine (ANTIVERT) 25 MG tablet Take 25 mg by mouth 3 (three) times daily as needed for dizziness.  01/05/19   [provider]  metFORMIN (GLUCOPHAGE) 1000 MG tablet Take 1,000 mg by mouth 2 (two) times daily.  03/09/19   [provider]  metoprolol tartrate (LOPRESSOR) 25 MG tablet Take 25 mg by mouth 2 (two) times daily. 06/04/20   [provider]  RESTASIS 0.05 % ophthalmic emulsion Place 1 drop into both eyes 2 (two) times daily.  04/06/20   [provider]      Allergies    Patient has no known allergies.    Review of Systems   Review of Systems  Constitutional:  Negative for fever.  Cardiovascular:  Negative for chest pain.  Gastrointestinal:  Negative  for abdominal pain.  Musculoskeletal:  Positive for gait problem.  Neurological:  Negative for syncope and speech difficulty.    Physical Exam Updated Vital Signs BP (!) 150/74 (BP Location: Right Arm)   Pulse 83   Temp 98.5 F (36.9 C) (Oral)   Resp 18   Ht '5\' 4"'$  (1.626 m)   Wt 122.5 kg   SpO2 95%   BMI 46.35 kg/m  Physical Exam Vitals and nursing note reviewed.  Constitutional:      General: She is not in acute distress.    Appearance: She is not ill-appearing.  HENT:     Head: Atraumatic.     Comments: No tenderness to the  scalp.  No battle sign, no raccoon eyes.  No laceration or obvious contusions. Eyes:     Conjunctiva/sclera: Conjunctivae normal.     Comments: Right eye vision loss  Left pupil is round and reactive to light. EOM intact  Neck:     Comments: No midline or paraspinal tenderness.  ROM intact. Cardiovascular:     Rate and Rhythm: Normal rate and regular rhythm.     Pulses: Normal pulses.     Heart sounds: No murmur heard. Pulmonary:     Effort: Pulmonary effort is normal. No respiratory distress.     Breath sounds: Normal breath sounds.  Chest:     Comments: No tenderness of the chest wall. Abdominal:     General: Abdomen is flat. There is no distension.     Palpations: Abdomen is soft.     Tenderness: There is no abdominal tenderness.  Musculoskeletal:        General: Normal range of motion.     Cervical back: Normal range of motion.  Skin:    General: Skin is warm and dry.     Capillary Refill: Capillary refill takes less than 2 seconds.  Neurological:     General: No focal deficit present.     Mental Status: She is alert.     Comments: Speech is clear, able to follow commands  Normal strength in upper and lower extremities bilaterally including dorsiflexion and plantar flexion, strong and equal grip strength.  Strong straight leg raise against resistance bilaterally. Subjective sensation intact bilaterally. Moves extremities without ataxia, coordination intact  No pronator drift     Psychiatric:        Mood and Affect: Mood normal.     ED Results / Procedures / Treatments   Labs (all labs ordered are listed, but only abnormal results are displayed) Labs Reviewed  COMPREHENSIVE METABOLIC PANEL - Abnormal; Notable for the following components:      Result Value   Glucose, Bld 185 (*)    Calcium 8.8 (*)    All other components within normal limits  CBC WITH DIFFERENTIAL/PLATELET - Abnormal; Notable for the following components:   Hemoglobin 11.0 (*)    HCT 33.3  (*)    Lymphs Abs 0.4 (*)    All other components within normal limits  URINALYSIS, ROUTINE W REFLEX MICROSCOPIC    EKG EKG Interpretation  Date/Time:  Friday January 19 2022 12:33:38 EDT Ventricular Rate:  85 PR Interval:  193 QRS Duration: 93 QT Interval:  366 QTC Calculation: 436 R Axis:   -14 Text Interpretation: Sinus rhythm Anterior infarct, old Confirmed by Noemi Chapel [id number]) on 01/19/2022 12:45:04 PM  Radiology CT Head Wo Contrast  Result Date: 01/19/2022 CLINICAL DATA:  Fall EXAM: CT HEAD WITHOUT CONTRAST CT CERVICAL SPINE WITHOUT CONTRAST TECHNIQUE: Multidetector CT  imaging of the head and cervical spine was performed following the standard protocol without intravenous contrast. Multiplanar CT image reconstructions of the cervical spine were also generated. RADIATION DOSE REDUCTION: This exam was performed according to the departmental dose-optimization program which includes automated exposure control, adjustment of the mA and/or kV according to patient size and/or use of iterative reconstruction technique. COMPARISON:  Brain MRI 08/05/2019, CTA neck 08/04/2019 FINDINGS: CT HEAD FINDINGS Brain: There is no acute intracranial hemorrhage, extra-axial fluid collection, or acute infarct. Parenchymal volume is normal. The ventricles are normal in size. Gray-white differentiation is preserved. There is no mass lesion.  There is no mass effect or midline shift. Vascular: There is calcification of the bilateral cavernous ICAs and vertebral arteries. Skull: Normal. Negative for fracture or focal lesion. Sinuses/Orbits: The imaged paranasal sinuses are clear. 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 The left globe and orbit are unremarkable. Other: None. CT CERVICAL SPINE FINDINGS Alignment: There is straightening of the normal cervical lordosis. There is mild anterolisthesis of C2 on C3 through C4 on C5, likely degenerative in nature. There is no jumped or perched facet or other evidence of  traumatic malalignment. Skull base and vertebrae: Skull base alignment is maintained. Vertebral body heights are preserved. There is no evidence of acute fracture. Soft tissues and spinal canal: No prevertebral fluid or swelling. No visible canal hematoma. Disc levels: Is disc space narrowing degenerative endplate change most advanced at C5-C6 through C7-T1. Is overall mild facet arthropathy, most advanced on the right at C3-C4. There is no evidence of high-grade spinal canal stenosis. Upper chest: The imaged lung apices are clear. Other: None. IMPRESSION: 1. No acute intracranial pathology. 2. No acute fracture or traumatic malalignment of the cervical spine. Electronically Signed   By: Valetta Mole M.D.   On: 01/19/2022 13:18   CT Cervical Spine Wo Contrast  Result Date: 01/19/2022 CLINICAL DATA:  Fall EXAM: CT HEAD WITHOUT CONTRAST CT CERVICAL SPINE WITHOUT CONTRAST TECHNIQUE: Multidetector CT imaging of the head and cervical spine was performed following the standard protocol without intravenous contrast. Multiplanar CT image reconstructions of the cervical spine were also generated. RADIATION DOSE REDUCTION: This exam was performed according to the departmental dose-optimization program which includes automated exposure control, adjustment of the mA and/or kV according to patient size and/or use of iterative reconstruction technique. COMPARISON:  Brain MRI 08/05/2019, CTA neck 08/04/2019 FINDINGS: CT HEAD FINDINGS Brain: There is no acute intracranial hemorrhage, extra-axial fluid collection, or acute infarct. Parenchymal volume is normal. The ventricles are normal in size. Gray-white differentiation is preserved. There is no mass lesion.  There is no mass effect or midline shift. Vascular: There is calcification of the bilateral cavernous ICAs and vertebral arteries. Skull: Normal. Negative for fracture or focal lesion. Sinuses/Orbits: The imaged paranasal sinuses are clear. Physis bulbi is noted on the  right. The left globe and orbit are unremarkable. Other: None. CT CERVICAL SPINE FINDINGS Alignment: There is straightening of the normal cervical lordosis. There is mild anterolisthesis of C2 on C3 through C4 on C5, likely degenerative in nature. There is no jumped or perched facet or other evidence of traumatic malalignment. Skull base and vertebrae: Skull base alignment is maintained. Vertebral body heights are preserved. There is no evidence of acute fracture. Soft tissues and spinal canal: No prevertebral fluid or swelling. No visible canal hematoma. Disc levels: Is disc space narrowing degenerative endplate change most advanced at C5-C6 through C7-T1. Is overall mild facet arthropathy, most advanced on the right  at C3-C4. There is no evidence of high-grade spinal canal stenosis. Upper chest: The imaged lung apices are clear. Other: None. IMPRESSION: 1. No acute intracranial pathology. 2. No acute fracture or traumatic malalignment of the cervical spine. Electronically Signed   By: Valetta Mole M.D.   On: 01/19/2022 13:18    Procedures Procedures    Medications Ordered in ED Medications - No data to display  ED Course/ Medical Decision Making/ A&P                           Medical Decision Making Amount and/or Complexity of Data Reviewed Labs: ordered. Radiology: ordered.   Social determinants of health:  Social History   Socioeconomic History   Marital status: Widowed    Spouse name: Not on file   Number of children: Not on file   Years of education: Not on file   Highest education level: Not on file  Occupational History   Not on file  Tobacco Use   Smoking status: Never   Smokeless tobacco: Never  Vaping Use   Vaping Use: Never used  Substance and Sexual Activity   Alcohol use: Never   Drug use: Never   Sexual activity: Not Currently    Birth control/protection: Surgical    Comment: Hysterectomy  Other Topics Concern   Not on file  Social History Narrative   Not on  file   Social Determinants of Health   Financial Resource Strain: Not on file  Food Insecurity: Not on file  Transportation Needs: Not on file  Physical Activity: Not on file  Stress: Not on file  Social Connections: Not on file  Intimate Partner Violence: Not on file     Initial impression:  This patient presents to the ED for concern of fall with head injury, this involves an extensive number of treatment options, and is a complaint that carries with it a high risk of complications and morbidity.   Differentials include mechanical fall, CVA, arrhythmia, basilar skull fracture, intracranial bleed, delirium due to sepsis.   Comorbidities affecting care:  Previous stroke, hypertension, hyperlipidemia, diabetes type 2  Additional history obtained: Daughter  Lab Tests  I Ordered, reviewed, and interpreted labs and EKG.  The pertinent results include:  CMP, CBC and UA normal  Imaging Studies ordered:  I ordered imaging studies including  CT head and C-spine without acute findings I independently visualized and interpreted imaging and I agree with the radiologist interpretation.   EKG: Sinus rhythm  Cardiac Monitoring:  The patient was maintained on a cardiac monitor.  I personally viewed and interpreted the cardiac monitored which showed an underlying rhythm of: Sinus rhythm   ED Course/Re-evaluation: Presents in no acute distress and is nontoxic-appearing.  Vitals are without significant abnormality and she is satting well on room air.  On exam, no evidence of basilar skull fracture, contusions or lacerations of the head.  Neck with full range of motion and is nontender to palpation.  Overall no signs of trauma, including bruising or tenderness.  She was alert and oriented x4 to self, current month, current year and birthday.  Neuro exam normal.  Labs were normal without evidence of infection, electrolyte abnormality.  EKG normal.  CT head and C-spine without acute findings.   Overall her work-up was very reassuring without emergent findings and she is ambulating with a cane.  I considered admission, however given her very reassuring work-up and physical exam, I do not  think that is clinically indicated at this time.  I discussed with patient and daughter strict return precautions and when to return to the emergency department.  They both expressed understanding are amenable to plan.  Disposition:  After consideration of the diagnostic results, physical exam, history and the patients response to treatment feel that the patent would benefit from discharge.   Fall Head injury: Plan and management as described above. Discharged home in good condition.  Final Clinical Impression(s) / ED Diagnoses Final diagnoses:  Fall, initial encounter  Injury of head, initial encounter    Rx / DC Orders ED Discharge Orders     None         Rodena Piety 01/19/22 1608    Noemi Chapel, MD 01/22/22 [phone]

## 2022-01-29 ENCOUNTER — Ambulatory Visit (HOSPITAL_COMMUNITY)
Admission: RE | Admit: 2022-01-29 | Discharge: 2022-01-29 | Disposition: A | Discharge status: Home / Self Care | Payer: 59 | Source: Ambulatory Visit | Attending: Gerontology | Admitting: Gerontology

## 2022-01-29 ENCOUNTER — Other Ambulatory Visit (HOSPITAL_COMMUNITY): Payer: Self-pay | Admitting: Gerontology

## 2022-01-29 DIAGNOSIS — R059 Cough, unspecified: Secondary | ICD-10-CM | POA: Insufficient documentation

## 2022-02-12 ENCOUNTER — Ambulatory Visit (INDEPENDENT_AMBULATORY_CARE_PROVIDER_SITE_OTHER): Payer: 59

## 2022-02-12 DIAGNOSIS — I639 Cerebral infarction, unspecified: Secondary | ICD-10-CM

## 2022-02-12 NOTE — Progress Notes (Signed)
Carelink Summary Report / Loop Recorder 

## 2022-02-13 LAB — CUP PACEART REMOTE DEVICE CHECK
Date Time Interrogation Session: 20230818231033
Implantable Pulse Generator Implant Date: 20210211

## 2022-03-10 NOTE — Progress Notes (Signed)
Carelink Summary Report / Loop Recorder 

## 2022-03-19 ENCOUNTER — Ambulatory Visit (INDEPENDENT_AMBULATORY_CARE_PROVIDER_SITE_OTHER): Payer: 59

## 2022-03-19 DIAGNOSIS — I639 Cerebral infarction, unspecified: Secondary | ICD-10-CM | POA: Diagnosis not present

## 2022-03-19 LAB — CUP PACEART REMOTE DEVICE CHECK
Date Time Interrogation Session: 20230920231618
Implantable Pulse Generator Implant Date: 20210211

## 2022-03-29 NOTE — Progress Notes (Signed)
Carelink Summary Report / Loop Recorder 

## 2022-04-11 ENCOUNTER — Other Ambulatory Visit (HOSPITAL_COMMUNITY)
Admission: RE | Admit: 2022-04-11 | Discharge: 2022-04-11 | Disposition: A | Discharge status: Home / Self Care | Payer: 59 | Source: Ambulatory Visit | Attending: Internal Medicine | Admitting: Internal Medicine

## 2022-04-11 ENCOUNTER — Other Ambulatory Visit (HOSPITAL_COMMUNITY): Payer: Self-pay | Admitting: Gerontology

## 2022-04-11 ENCOUNTER — Ambulatory Visit (HOSPITAL_COMMUNITY)
Admission: RE | Admit: 2022-04-11 | Discharge: 2022-04-11 | Disposition: A | Discharge status: Home / Self Care | Payer: 59 | Source: Ambulatory Visit | Attending: Gerontology | Admitting: Gerontology

## 2022-04-11 DIAGNOSIS — R059 Cough, unspecified: Secondary | ICD-10-CM

## 2022-04-11 DIAGNOSIS — E785 Hyperlipidemia, unspecified: Secondary | ICD-10-CM | POA: Diagnosis present

## 2022-04-20 LAB — CUP PACEART REMOTE DEVICE CHECK
Date Time Interrogation Session: 20231024001239
Implantable Pulse Generator Implant Date: 20210211

## 2022-04-23 ENCOUNTER — Ambulatory Visit (INDEPENDENT_AMBULATORY_CARE_PROVIDER_SITE_OTHER): Payer: 59

## 2022-04-23 DIAGNOSIS — I639 Cerebral infarction, unspecified: Secondary | ICD-10-CM

## 2022-05-22 NOTE — Progress Notes (Signed)
Carelink Summary Report / Loop Recorder 

## 2022-05-28 ENCOUNTER — Ambulatory Visit (INDEPENDENT_AMBULATORY_CARE_PROVIDER_SITE_OTHER): Payer: 59

## 2022-05-28 DIAGNOSIS — I639 Cerebral infarction, unspecified: Secondary | ICD-10-CM

## 2022-05-28 LAB — CUP PACEART REMOTE DEVICE CHECK
Date Time Interrogation Session: 20231203232032
Implantable Pulse Generator Implant Date: 20210211

## 2022-07-02 ENCOUNTER — Ambulatory Visit (INDEPENDENT_AMBULATORY_CARE_PROVIDER_SITE_OTHER): Payer: Medicare Other

## 2022-07-02 DIAGNOSIS — I639 Cerebral infarction, unspecified: Secondary | ICD-10-CM

## 2022-07-02 LAB — CUP PACEART REMOTE DEVICE CHECK
Date Time Interrogation Session: 20240107232552
Implantable Pulse Generator Implant Date: 20210211

## 2022-07-04 NOTE — Progress Notes (Signed)
Carelink Summary Report / Loop Recorder 

## 2022-07-05 DIAGNOSIS — Z794 Long term (current) use of insulin: Secondary | ICD-10-CM | POA: Diagnosis not present

## 2022-07-05 DIAGNOSIS — E118 Type 2 diabetes mellitus with unspecified complications: Secondary | ICD-10-CM | POA: Diagnosis not present

## 2022-07-12 DIAGNOSIS — E1142 Type 2 diabetes mellitus with diabetic polyneuropathy: Secondary | ICD-10-CM | POA: Diagnosis not present

## 2022-07-12 DIAGNOSIS — I10 Essential (primary) hypertension: Secondary | ICD-10-CM | POA: Diagnosis not present

## 2022-07-19 DIAGNOSIS — Z794 Long term (current) use of insulin: Secondary | ICD-10-CM | POA: Diagnosis not present

## 2022-07-19 DIAGNOSIS — E118 Type 2 diabetes mellitus with unspecified complications: Secondary | ICD-10-CM | POA: Diagnosis not present

## 2022-07-31 DIAGNOSIS — Z794 Long term (current) use of insulin: Secondary | ICD-10-CM | POA: Diagnosis not present

## 2022-07-31 DIAGNOSIS — E118 Type 2 diabetes mellitus with unspecified complications: Secondary | ICD-10-CM | POA: Diagnosis not present

## 2022-08-02 ENCOUNTER — Encounter (HOSPITAL_COMMUNITY): Payer: Self-pay

## 2022-08-02 ENCOUNTER — Emergency Department (HOSPITAL_COMMUNITY)
Admission: EM | Admit: 2022-08-02 | Discharge: 2022-08-02 | Disposition: A | Discharge status: Home / Self Care | Payer: 59 | Attending: Emergency Medicine | Admitting: Emergency Medicine

## 2022-08-02 ENCOUNTER — Emergency Department (HOSPITAL_COMMUNITY): Payer: 59

## 2022-08-02 DIAGNOSIS — H5461 Unqualified visual loss, right eye, normal vision left eye: Secondary | ICD-10-CM | POA: Insufficient documentation

## 2022-08-02 DIAGNOSIS — Z794 Long term (current) use of insulin: Secondary | ICD-10-CM | POA: Insufficient documentation

## 2022-08-02 DIAGNOSIS — Z7982 Long term (current) use of aspirin: Secondary | ICD-10-CM | POA: Diagnosis not present

## 2022-08-02 DIAGNOSIS — R519 Headache, unspecified: Secondary | ICD-10-CM | POA: Diagnosis not present

## 2022-08-02 DIAGNOSIS — R42 Dizziness and giddiness: Secondary | ICD-10-CM | POA: Diagnosis not present

## 2022-08-02 HISTORY — DX: Personal history of other specified conditions: Z87.898

## 2022-08-02 LAB — CBC WITH DIFFERENTIAL/PLATELET
Abs Immature Granulocytes: 0.02 10*3/uL (ref 0.00–0.07)
Basophils Absolute: 0 10*3/uL (ref 0.0–0.1)
Basophils Relative: 1 %
Eosinophils Absolute: 0.1 10*3/uL (ref 0.0–0.5)
Eosinophils Relative: 2 %
HCT: 36.9 % (ref 36.0–46.0)
Hemoglobin: 11.8 g/dL — ABNORMAL LOW (ref 12.0–15.0)
Immature Granulocytes: 0 %
Lymphocytes Relative: 26 %
Lymphs Abs: 1.6 10*3/uL (ref 0.7–4.0)
MCH: 27.4 pg (ref 26.0–34.0)
MCHC: 32 g/dL (ref 30.0–36.0)
MCV: 85.8 fL (ref 80.0–100.0)
Monocytes Absolute: 0.4 10*3/uL (ref 0.1–1.0)
Monocytes Relative: 6 %
Neutro Abs: 4.2 10*3/uL (ref 1.7–7.7)
Neutrophils Relative %: 65 %
Platelets: 278 10*3/uL (ref 150–400)
RBC: 4.3 MIL/uL (ref 3.87–5.11)
RDW: 15.8 % — ABNORMAL HIGH (ref 11.5–15.5)
WBC: 6.4 10*3/uL (ref 4.0–10.5)
nRBC: 0 % (ref 0.0–0.2)

## 2022-08-02 LAB — COMPREHENSIVE METABOLIC PANEL
ALT: 14 U/L (ref 0–44)
AST: 18 U/L (ref 15–41)
Albumin: 4.1 g/dL (ref 3.5–5.0)
Alkaline Phosphatase: 116 U/L (ref 38–126)
Anion gap: 9 (ref 5–15)
BUN: 16 mg/dL (ref 8–23)
CO2: 23 mmol/L (ref 22–32)
Calcium: 9.7 mg/dL (ref 8.9–10.3)
Chloride: 106 mmol/L (ref 98–111)
Creatinine, Ser: 0.9 mg/dL (ref 0.44–1.00)
GFR, Estimated: >60 mL/min (ref >60)
Glucose, Bld: 213 mg/dL — ABNORMAL HIGH (ref 70–99)
Potassium: 4.7 mmol/L (ref 3.5–5.1)
Sodium: 138 mmol/L (ref 135–145)
Total Bilirubin: 0.5 mg/dL (ref 0.3–1.2)
Total Protein: 7.7 g/dL (ref 6.5–8.1)

## 2022-08-02 MED ORDER — MECLIZINE HCL 12.5 MG PO TABS
25.0000 mg | ORAL_TABLET | Freq: Once | ORAL | Status: AC
  Administered 2022-08-02: 25 mg via ORAL
  Filled 2022-08-02: qty 2

## 2022-08-02 MED ORDER — MECLIZINE HCL 25 MG PO TABS: 25.0000 mg | ORAL_TABLET | Freq: Three times a day (TID) | ORAL | 0 refills | Status: DC | PRN

## 2022-08-02 NOTE — ED Provider Notes (Signed)
[facility] Provider Note   CSN: [id number] Arrival date & time: 08/02/22  0107     History  Chief Complaint  Patient presents with   Dizziness    Jean Watts is a 82 y.o. female.  82 year old female with multimedical problems who presents ER today with vertigo.  States that she had before.  She did not really realize it was until she got here and thought about it.  She states that when she lays down or turns her head a certain way she feels the room spinning.  She is nauseous.  Started last night when she laid down to happen again tonight when she laid down so she presents here for further evaluation.  No other neurologic changes.  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    Dizziness      Home Medications Prior to Admission medications   Medication Sig Start Date End Date Taking? Authorizing Provider  meclizine (ANTIVERT) 25 MG tablet Take 1 tablet (25 mg total) by mouth 3 (three) times daily as needed for dizziness. 08/02/22  Yes Sten Dematteo, Corene Cornea, MD  ACCU-CHEK AVIVA PLUS test strip  05/09/20   [provider]  acetaminophen (TYLENOL) 500 MG tablet Take 500 mg by mouth every 6 (six) hours as needed for moderate pain.    [provider]  albuterol (PROVENTIL) (2.5 MG/3ML) 0.083% nebulizer solution Inhale 2.5 mg into the lungs every 6 (six) hours as needed for wheezing or shortness of breath.  01/29/19   [provider]  amitriptyline (ELAVIL) 50 MG tablet Take 50 mg by mouth at bedtime.  02/17/19   [provider]  amLODipine-benazepril (LOTREL) 5-10 MG capsule Take 1 capsule by mouth daily. 05/03/20   Roxan Hockey, MD  aspirin 325 MG tablet Take 1 tablet (325 mg total) by mouth daily with breakfast. 05/03/20   Roxan Hockey, MD  BD INSULIN SYRINGE U/F 31G X 5/16" 1 ML MISC 2 (two) times daily. as directed 03/02/20   [provider]  gabapentin (NEURONTIN) 400 MG capsule Take 400 mg by mouth 3  (three) times daily.    [provider]  HUMULIN N 100 UNIT/ML injection Inject 0.15-0.35 mLs (15-35 Units total) into the skin See admin instructions. Inject 35 units into the skin in the morning and 15 units in the evening Patient taking differently: Inject 20-40 Units into the skin See admin instructions. Inject 40 units into the skin in the morning and 20 units in the evening 05/03/20   Roxan Hockey, MD  metFORMIN (GLUCOPHAGE) 1000 MG tablet Take 1,000 mg by mouth 2 (two) times daily.  03/09/19   [provider]  metoprolol tartrate (LOPRESSOR) 25 MG tablet Take 25 mg by mouth 2 (two) times daily. 06/04/20   [provider]  RESTASIS 0.05 % ophthalmic emulsion Place 1 drop into both eyes 2 (two) times daily.  04/06/20   [provider]      Allergies    Patient has no known allergies.    Review of Systems   Review of Systems  Neurological:  Positive for dizziness.    Physical Exam Updated Vital Signs BP (!) 143/79   Pulse 90   Temp 98.2 F (36.8 C) (Oral)   Resp 17   Ht '5\' 4"'$  (1.626 m)   Wt 122.5 kg   SpO2 97%   BMI 46.35 kg/m  Physical Exam Vitals and nursing note reviewed.  Constitutional:      Appearance: She  is well-developed.  HENT:     Head: Normocephalic and atraumatic.     Mouth/Throat:     Mouth: Mucous membranes are moist.  Eyes:     Comments: Right eye blindness and opacification chronic  Cardiovascular:     Rate and Rhythm: Normal rate and regular rhythm.  Pulmonary:     Effort: No respiratory distress.     Breath sounds: No stridor.  Abdominal:     General: Abdomen is flat. There is no distension.  Musculoskeletal:     Cervical back: Normal range of motion.  Skin:    General: Skin is warm and dry.  Neurological:     General: No focal deficit present.     Mental Status: She is alert.     Comments: Aside from chronic right eye blindness no obvious neurologic abnormalities, face is symmetric, grip strength is  symmetric, elbow flexion and extension is symmetric, sensation to light touch in distal extremities is symmetric     ED Results / Procedures / Treatments   Labs (all labs ordered are listed, but only abnormal results are displayed) Labs Reviewed  CBC WITH DIFFERENTIAL/PLATELET - Abnormal; Notable for the following components:      Result Value   Hemoglobin 11.8 (*)    RDW 15.8 (*)    All other components within normal limits  COMPREHENSIVE METABOLIC PANEL - Abnormal; Notable for the following components:   Glucose, Bld 213 (*)    All other components within normal limits    EKG None  Radiology CT Head Wo Contrast  Result Date: 08/02/2022 CLINICAL DATA:  Headache, dizziness EXAM: CT HEAD WITHOUT CONTRAST TECHNIQUE: Contiguous axial images were obtained from the base of the skull through the vertex without intravenous contrast. RADIATION DOSE REDUCTION: This exam was performed according to the departmental dose-optimization program which includes automated exposure control, adjustment of the mA and/or kV according to patient size and/or use of iterative reconstruction technique. COMPARISON:  01/19/2022 FINDINGS: Brain: There is atrophy and chronic small vessel disease changes. No acute intracranial abnormality. Specifically, no hemorrhage, hydrocephalus, mass lesion, acute infarction, or significant intracranial injury. Vascular: No hyperdense vessel or unexpected calcification. Skull: No acute calvarial abnormality. Sinuses/Orbits: No acute findings Other: None IMPRESSION: Atrophy, chronic microvascular disease. No acute intracranial abnormality. Electronically Signed   By: Rolm Baptise M.D.   On: 08/02/2022 02:55    Procedures Procedures    Medications Ordered in ED Medications  meclizine (ANTIVERT) tablet 25 mg (25 mg Oral Given 08/02/22 0239)    ED Course/ Medical Decision Making/ A&P                             Medical Decision Making Amount and/or Complexity of Data  Reviewed Labs: ordered. Radiology: ordered.   Likely BPPV.  Will represcribe her meclizine and have her follow-up with her doctor if not improved in 3 to 4 days.  Discussed Epley maneuvers.  CT head done and showed no obvious bleed or large stroke (independently viewed and interpreted by myself and radiology read reviewed).       Final Clinical Impression(s) / ED Diagnoses Final diagnoses:  Vertigo    Rx / DC Orders ED Discharge Orders          Ordered    meclizine (ANTIVERT) 25 MG tablet  3 times daily PRN        08/02/22 0335  Gable Odonohue, Corene Cornea, MD 08/02/22 [phone]

## 2022-08-02 NOTE — ED Triage Notes (Signed)
Pt here with sister via POV, pt reports dizziness since last night, reports worse with laying down, at times worse turing head side to side. Feels better sitting up. Denies n/v associated with dizziness. Pt reports left-sided earache 1 week ago. Pt has hx of vertigo, forgot she was prescribe Antivert, has nota taken in a while. Pt was able to ambulate to treatment room w/o difficulty use of care, sister reports pt is at her baseline. Hx of CVA, some left side deficit d/t prior stokes.

## 2022-08-03 NOTE — Progress Notes (Signed)
Carelink Summary Report / Loop Recorder 

## 2022-08-05 LAB — CUP PACEART REMOTE DEVICE CHECK
Date Time Interrogation Session: 20240209231303
Implantable Pulse Generator Implant Date: 20210211

## 2022-08-06 ENCOUNTER — Ambulatory Visit: Payer: 59

## 2022-08-06 DIAGNOSIS — I639 Cerebral infarction, unspecified: Secondary | ICD-10-CM

## 2022-08-09 DIAGNOSIS — R42 Dizziness and giddiness: Secondary | ICD-10-CM | POA: Diagnosis not present

## 2022-08-09 DIAGNOSIS — E1165 Type 2 diabetes mellitus with hyperglycemia: Secondary | ICD-10-CM | POA: Diagnosis not present

## 2022-08-09 DIAGNOSIS — Z1389 Encounter for screening for other disorder: Secondary | ICD-10-CM | POA: Diagnosis not present

## 2022-08-09 DIAGNOSIS — Z0001 Encounter for general adult medical examination with abnormal findings: Secondary | ICD-10-CM | POA: Diagnosis not present

## 2022-08-09 DIAGNOSIS — H5461 Unqualified visual loss, right eye, normal vision left eye: Secondary | ICD-10-CM | POA: Diagnosis not present

## 2022-08-09 DIAGNOSIS — Z23 Encounter for immunization: Secondary | ICD-10-CM | POA: Diagnosis not present

## 2022-08-09 DIAGNOSIS — E785 Hyperlipidemia, unspecified: Secondary | ICD-10-CM | POA: Diagnosis not present

## 2022-08-09 DIAGNOSIS — I10 Essential (primary) hypertension: Secondary | ICD-10-CM | POA: Diagnosis not present

## 2022-08-18 DIAGNOSIS — Z794 Long term (current) use of insulin: Secondary | ICD-10-CM | POA: Diagnosis not present

## 2022-08-18 DIAGNOSIS — E118 Type 2 diabetes mellitus with unspecified complications: Secondary | ICD-10-CM | POA: Diagnosis not present

## 2022-09-07 DIAGNOSIS — I10 Essential (primary) hypertension: Secondary | ICD-10-CM | POA: Diagnosis not present

## 2022-09-07 DIAGNOSIS — E1142 Type 2 diabetes mellitus with diabetic polyneuropathy: Secondary | ICD-10-CM | POA: Diagnosis not present

## 2022-09-10 ENCOUNTER — Ambulatory Visit (INDEPENDENT_AMBULATORY_CARE_PROVIDER_SITE_OTHER): Payer: 59

## 2022-09-10 DIAGNOSIS — I639 Cerebral infarction, unspecified: Secondary | ICD-10-CM | POA: Diagnosis not present

## 2022-09-10 LAB — CUP PACEART REMOTE DEVICE CHECK
Date Time Interrogation Session: 20240317232345
Implantable Pulse Generator Implant Date: 20210211

## 2022-09-18 NOTE — Progress Notes (Signed)
Carelink Summary Report / Loop Recorder 

## 2022-09-25 DIAGNOSIS — E1042 Type 1 diabetes mellitus with diabetic polyneuropathy: Secondary | ICD-10-CM | POA: Diagnosis not present

## 2022-10-08 DIAGNOSIS — E1142 Type 2 diabetes mellitus with diabetic polyneuropathy: Secondary | ICD-10-CM | POA: Diagnosis not present

## 2022-10-08 DIAGNOSIS — I10 Essential (primary) hypertension: Secondary | ICD-10-CM | POA: Diagnosis not present

## 2022-10-15 ENCOUNTER — Ambulatory Visit (INDEPENDENT_AMBULATORY_CARE_PROVIDER_SITE_OTHER): Payer: 59

## 2022-10-15 DIAGNOSIS — I639 Cerebral infarction, unspecified: Secondary | ICD-10-CM | POA: Diagnosis not present

## 2022-10-15 LAB — CUP PACEART REMOTE DEVICE CHECK
Date Time Interrogation Session: 20240421231728
Implantable Pulse Generator Implant Date: 20210211

## 2022-10-17 NOTE — Progress Notes (Signed)
Carelink Summary Report / Loop Recorder 

## 2022-11-08 DIAGNOSIS — E1165 Type 2 diabetes mellitus with hyperglycemia: Secondary | ICD-10-CM | POA: Diagnosis not present

## 2022-11-08 DIAGNOSIS — E785 Hyperlipidemia, unspecified: Secondary | ICD-10-CM | POA: Diagnosis not present

## 2022-11-08 DIAGNOSIS — I10 Essential (primary) hypertension: Secondary | ICD-10-CM | POA: Diagnosis not present

## 2022-11-16 NOTE — Progress Notes (Signed)
Carelink Summary Report / Loop Recorder 

## 2022-11-20 ENCOUNTER — Ambulatory Visit (INDEPENDENT_AMBULATORY_CARE_PROVIDER_SITE_OTHER): Payer: 59

## 2022-11-20 DIAGNOSIS — I639 Cerebral infarction, unspecified: Secondary | ICD-10-CM

## 2022-11-20 LAB — CUP PACEART REMOTE DEVICE CHECK
Date Time Interrogation Session: 20240524230752
Implantable Pulse Generator Implant Date: 20210211

## 2022-12-09 DIAGNOSIS — I10 Essential (primary) hypertension: Secondary | ICD-10-CM | POA: Diagnosis not present

## 2022-12-09 DIAGNOSIS — E1142 Type 2 diabetes mellitus with diabetic polyneuropathy: Secondary | ICD-10-CM | POA: Diagnosis not present

## 2022-12-11 ENCOUNTER — Other Ambulatory Visit: Payer: Self-pay

## 2022-12-11 ENCOUNTER — Encounter (HOSPITAL_COMMUNITY): Payer: Self-pay | Admitting: Emergency Medicine

## 2022-12-11 ENCOUNTER — Emergency Department (HOSPITAL_COMMUNITY): Payer: 59

## 2022-12-11 ENCOUNTER — Emergency Department (HOSPITAL_COMMUNITY)
Admission: EM | Admit: 2022-12-11 | Discharge: 2022-12-11 | Disposition: A | Discharge status: Home / Self Care | Payer: 59 | Attending: Emergency Medicine | Admitting: Emergency Medicine

## 2022-12-11 DIAGNOSIS — I10 Essential (primary) hypertension: Secondary | ICD-10-CM | POA: Insufficient documentation

## 2022-12-11 DIAGNOSIS — Z8673 Personal history of transient ischemic attack (TIA), and cerebral infarction without residual deficits: Secondary | ICD-10-CM | POA: Diagnosis not present

## 2022-12-11 DIAGNOSIS — E119 Type 2 diabetes mellitus without complications: Secondary | ICD-10-CM | POA: Diagnosis not present

## 2022-12-11 DIAGNOSIS — K5900 Constipation, unspecified: Secondary | ICD-10-CM | POA: Diagnosis not present

## 2022-12-11 LAB — COMPREHENSIVE METABOLIC PANEL
ALT: 12 U/L (ref 0–44)
AST: 13 U/L — ABNORMAL LOW (ref 15–41)
Albumin: 3.9 g/dL (ref 3.5–5.0)
Alkaline Phosphatase: 104 U/L (ref 38–126)
Anion gap: 9 (ref 5–15)
BUN: 16 mg/dL (ref 8–23)
CO2: 24 mmol/L (ref 22–32)
Calcium: 9.3 mg/dL (ref 8.9–10.3)
Chloride: 106 mmol/L (ref 98–111)
Creatinine, Ser: 0.79 mg/dL (ref 0.44–1.00)
GFR, Estimated: >60 mL/min (ref >60)
Glucose, Bld: 120 mg/dL — ABNORMAL HIGH (ref 70–99)
Potassium: 3.8 mmol/L (ref 3.5–5.1)
Sodium: 139 mmol/L (ref 135–145)
Total Bilirubin: 0.3 mg/dL (ref 0.3–1.2)
Total Protein: 7.6 g/dL (ref 6.5–8.1)

## 2022-12-11 LAB — CBC WITH DIFFERENTIAL/PLATELET
Abs Immature Granulocytes: 0.01 10*3/uL (ref 0.00–0.07)
Basophils Absolute: 0 10*3/uL (ref 0.0–0.1)
Basophils Relative: 1 %
Eosinophils Absolute: 0.1 10*3/uL (ref 0.0–0.5)
Eosinophils Relative: 2 %
HCT: 37 % (ref 36.0–46.0)
Hemoglobin: 12.2 g/dL (ref 12.0–15.0)
Immature Granulocytes: 0 %
Lymphocytes Relative: 40 %
Lymphs Abs: 2.4 10*3/uL (ref 0.7–4.0)
MCH: 27.9 pg (ref 26.0–34.0)
MCHC: 33 g/dL (ref 30.0–36.0)
MCV: 84.7 fL (ref 80.0–100.0)
Monocytes Absolute: 0.3 10*3/uL (ref 0.1–1.0)
Monocytes Relative: 5 %
Neutro Abs: 3.2 10*3/uL (ref 1.7–7.7)
Neutrophils Relative %: 52 %
Platelets: 278 10*3/uL (ref 150–400)
RBC: 4.37 MIL/uL (ref 3.87–5.11)
RDW: 15.9 % — ABNORMAL HIGH (ref 11.5–15.5)
WBC: 6.1 10*3/uL (ref 4.0–10.5)
nRBC: 0 % (ref 0.0–0.2)

## 2022-12-11 MED ORDER — SORBITOL 70 % SOLN
960.0000 mL | TOPICAL_OIL | Freq: Once | ORAL | Status: AC
  Administered 2022-12-11: 960 mL via RECTAL
  Filled 2022-12-11: qty 240

## 2022-12-11 NOTE — Discharge Instructions (Signed)
You may take over-the-counter MiraLAX, 1 heaping capful mixed in 8 to 10 ounces of water or juice or coffee once a day as needed to help with your chronic constipation.  Do not start this medication until the weekend.  Try to drink plenty of water.  Follow-up with your primary care provider for recheck, return to the emergency department for any new or worsening symptoms.

## 2022-12-11 NOTE — ED Notes (Signed)
Pt had very large BM. Clothes became soiled during. Pt washed with warm water and soap. Peri care performed. Pt given paper scrubs to wear. Full linen change to bed.

## 2022-12-11 NOTE — ED Triage Notes (Signed)
Pt c/o constipation x 2 weeks. Denies hemorrhoids/bleeding/pain. No n/v  and no abdominal surgeries. Pt has taken OTC dulcolax w/o relief.

## 2022-12-12 NOTE — Progress Notes (Signed)
Carelink Summary Report / Loop Recorder 

## 2022-12-14 NOTE — ED Provider Notes (Signed)
[place] EMERGENCY DEPARTMENT AT [facility] Provider Note   CSN: [id number] Arrival date & time: 12/11/22  1006     History  Chief Complaint  Patient presents with   Constipation    Jean Watts is a 82 y.o. female.   Constipation Associated symptoms: no abdominal pain, no back pain, no dysuria, no fever, no nausea and no vomiting        Jean Watts is a 82 y.o. female with past medical history of type 2 diabetes, prior stroke, hypertension, who presents to the Emergency Department complaining of constipation.  States she has not had a significant bowel movement in 2 weeks.  She describes a fullness and bloating sensation of her abdomen.  She denies any specific abdominal pain, nausea, vomiting.  She has tried over-the-counter Dulcolax without relief.  She reports recurrent episodes of constipation.  History of prior abdominal hysterectomy    Home Medications Prior to Admission medications   Medication Sig Start Date End Date Taking? Authorizing Provider  ACCU-CHEK AVIVA PLUS test strip  05/09/20   [provider]  acetaminophen (TYLENOL) 500 MG tablet Take 500 mg by mouth every 6 (six) hours as needed for moderate pain.    [provider]  albuterol (PROVENTIL) (2.5 MG/3ML) 0.083% nebulizer solution Inhale 2.5 mg into the lungs every 6 (six) hours as needed for wheezing or shortness of breath.  01/29/19   [provider]  amitriptyline (ELAVIL) 50 MG tablet Take 50 mg by mouth at bedtime.  02/17/19   [provider]  amLODipine-benazepril (LOTREL) 5-10 MG capsule Take 1 capsule by mouth daily. 05/03/20   Shon Hale, MD  aspirin 325 MG tablet Take 1 tablet (325 mg total) by mouth daily with breakfast. 05/03/20   Shon Hale, MD  BD INSULIN SYRINGE U/F 31G X 5/16" 1 ML MISC 2 (two) times daily. as directed 03/02/20   [provider]  gabapentin (NEURONTIN) 400 MG capsule Take 400 mg by mouth 3 (three) times  daily.    [provider]  HUMULIN N 100 UNIT/ML injection Inject 0.15-0.35 mLs (15-35 Units total) into the skin See admin instructions. Inject 35 units into the skin in the morning and 15 units in the evening Patient taking differently: Inject 20-40 Units into the skin See admin instructions. Inject 40 units into the skin in the morning and 20 units in the evening 05/03/20   Shon Hale, MD  meclizine (ANTIVERT) 25 MG tablet Take 1 tablet (25 mg total) by mouth 3 (three) times daily as needed for dizziness. 08/02/22   Mesner, Barbara Cower, MD  metFORMIN (GLUCOPHAGE) 1000 MG tablet Take 1,000 mg by mouth 2 (two) times daily.  03/09/19   [provider]  metoprolol tartrate (LOPRESSOR) 25 MG tablet Take 25 mg by mouth 2 (two) times daily. 06/04/20   [provider]  RESTASIS 0.05 % ophthalmic emulsion Place 1 drop into both eyes 2 (two) times daily.  04/06/20   [provider]      Allergies    Patient has no known allergies.    Review of Systems   Review of Systems  Constitutional:  Negative for appetite change, chills and fever.  Respiratory:  Negative for shortness of breath.   Cardiovascular:  Negative for chest pain.  Gastrointestinal:  Positive for constipation. Negative for abdominal pain, nausea, rectal pain and vomiting.  Genitourinary:  Negative for dysuria and flank pain.  Musculoskeletal:  Negative for back pain.  Neurological:  Negative for weakness  and numbness.    Physical Exam Updated Vital Signs BP (!) 162/83 (BP Location: Right Arm)   Pulse 74   Temp 98 F (36.7 C) (Oral)   Resp 18   Ht 5\' 5"  (1.651 m)   Wt 122.5 kg   SpO2 97%   BMI 44.93 kg/m  Physical Exam Vitals and nursing note reviewed.  Constitutional:      General: She is not in acute distress.    Appearance: Normal appearance. She is not toxic-appearing.  HENT:     Mouth/Throat:     Mouth: Mucous membranes are moist.  Cardiovascular:     Rate and Rhythm: Normal rate  and regular rhythm.     Pulses: Normal pulses.  Pulmonary:     Effort: Pulmonary effort is normal.  Abdominal:     Palpations: Abdomen is soft. There is no mass.     Tenderness: There is no abdominal tenderness. There is no guarding.  Musculoskeletal:        General: Normal range of motion.     Right lower leg: No edema.     Left lower leg: No edema.  Skin:    General: Skin is warm.     Capillary Refill: Capillary refill takes less than 2 seconds.  Neurological:     General: No focal deficit present.     Mental Status: She is alert.     Sensory: No sensory deficit.     Motor: No weakness.     ED Results / Procedures / Treatments   Labs (all labs ordered are listed, but only abnormal results are displayed) Labs Reviewed  CBC WITH DIFFERENTIAL/PLATELET - Abnormal; Notable for the following components:      Result Value   RDW 15.9 (*)    All other components within normal limits  COMPREHENSIVE METABOLIC PANEL - Abnormal; Notable for the following components:   Glucose, Bld 120 (*)    AST 13 (*)    All other components within normal limits    EKG None  Radiology No results found.  Procedures Procedures    Medications Ordered in ED Medications  sorbitol, milk of mag, mineral oil, glycerin (SMOG) enema (960 mLs Rectal Given 12/11/22 1628)    ED Course/ Medical Decision Making/ A&P                             Medical Decision Making Patient here for evaluation of constipation x 2 weeks.  Has history of recurrent constipation.  Has used over-the-counter Dulcolax without relief.  No significant abdominal surgeries other than remote abdominal hysterectomy  On exam, patient is well-appearing nontoxic.  Her abdomen is soft without guarding or rebound tenderness.  Will obtain plain film imaging of the abdomen for evaluation of constipation versus bowel obstruction.  If constipation noted without obstruction, will try smog enema.  Amount and/or Complexity of Data  Reviewed Labs: ordered.    Details: Labs unremarkable Radiology: ordered.    Details: 1 view of the abdomen shows prominent stool throughout the colon suggestive of constipation Discussion of management or test interpretation with external provider(s): Smog enema given here  On recheck, patient had very large BM here on the stretcher and in bedside commode.  Cleaned by nursing staff.  Patient reports that she is feeling much better and she is ready for discharge home.  I recommended to increase her fluid intake and use over-the-counter MiraLAX as needed for her constipation issues.  Appears  appropriate for discharge home, all questions were answered.           Final Clinical Impression(s) / ED Diagnoses Final diagnoses:  Constipation, unspecified constipation type    Rx / DC Orders ED Discharge Orders     None         Rosey Bath 12/14/22 1601    Gerhard Munch, MD 12/15/22 1131

## 2022-12-18 ENCOUNTER — Telehealth: Payer: Self-pay

## 2022-12-18 NOTE — Telephone Encounter (Signed)
Transition Care Management Unsuccessful Follow-up Telephone Call  Date of discharge and from where:  12/11/2022 [facility]  Attempts:  1st Attempt  Reason for unsuccessful TCM follow-up call:  Left voice message  Dalyn Becker [facility]  [facility] Population Health Community Resource Care Guide   [email or web address]  ?? [phone]   Website: [email or web address]

## 2022-12-19 ENCOUNTER — Telehealth: Payer: Self-pay

## 2022-12-19 NOTE — Telephone Encounter (Signed)
Transition Care Management Follow-up Telephone Call Date of discharge and from where: 12/11/2022 [facility] How have you been since you were released from the hospital? Patient is still not feeling well, still constipated per daughter Elease Hashimoto. Any questions or concerns? No  Items Reviewed: Did the pt receive and understand the discharge instructions provided? Yes  Medications obtained and verified? Yes  Other?  Patient's daughter Elease Hashimoto is interested in patient receiving Meals on Wheels. Called Esmeralda Links at [employer], she will contact Elease Hashimoto in a few days. Any new allergies since your discharge? No  Dietary orders reviewed? Yes Do you have support at home? Yes   Follow up appointments reviewed:  PCP Hospital f/u appt confirmed?  Patient's daughter Elease Hashimoto will call today to make appointment patient is still constipated.  Scheduled to see  on  @ . Specialist Hospital f/u appt confirmed? No  Scheduled to see  on  @ . Are transportation arrangements needed? No  If their condition worsens, is the pt aware to call PCP or go to the Emergency Dept.? Yes Was the patient provided with contact information for the PCP's office or ED? Yes Was to pt encouraged to call back with questions or concerns? Yes  Kerim Statzer [facility]  [facility] Population Health Community Resource Care Guide   [email or web address]  ?? [phone]   Website: [email or web address]  [email or web address]

## 2022-12-20 LAB — CUP PACEART REMOTE DEVICE CHECK
Date Time Interrogation Session: 20240626231000
Implantable Pulse Generator Implant Date: 20210211

## 2022-12-24 ENCOUNTER — Ambulatory Visit (INDEPENDENT_AMBULATORY_CARE_PROVIDER_SITE_OTHER): Payer: 59

## 2022-12-24 DIAGNOSIS — I639 Cerebral infarction, unspecified: Secondary | ICD-10-CM | POA: Diagnosis not present

## 2023-01-08 DIAGNOSIS — I10 Essential (primary) hypertension: Secondary | ICD-10-CM | POA: Diagnosis not present

## 2023-01-08 DIAGNOSIS — E1142 Type 2 diabetes mellitus with diabetic polyneuropathy: Secondary | ICD-10-CM | POA: Diagnosis not present

## 2023-01-09 NOTE — Progress Notes (Signed)
 Carelink Summary Report / Loop Recorder 

## 2023-01-21 ENCOUNTER — Ambulatory Visit: Payer: 59

## 2023-01-21 DIAGNOSIS — I639 Cerebral infarction, unspecified: Secondary | ICD-10-CM | POA: Diagnosis not present

## 2023-01-28 DIAGNOSIS — E1165 Type 2 diabetes mellitus with hyperglycemia: Secondary | ICD-10-CM | POA: Diagnosis not present

## 2023-01-28 DIAGNOSIS — R197 Diarrhea, unspecified: Secondary | ICD-10-CM | POA: Diagnosis not present

## 2023-01-28 DIAGNOSIS — I10 Essential (primary) hypertension: Secondary | ICD-10-CM | POA: Diagnosis not present

## 2023-01-29 DIAGNOSIS — I639 Cerebral infarction, unspecified: Secondary | ICD-10-CM | POA: Diagnosis not present

## 2023-01-31 DIAGNOSIS — R197 Diarrhea, unspecified: Secondary | ICD-10-CM | POA: Diagnosis not present

## 2023-02-05 NOTE — Progress Notes (Signed)
Carelink Summary Report / Loop Recorder 

## 2023-02-13 ENCOUNTER — Encounter (INDEPENDENT_AMBULATORY_CARE_PROVIDER_SITE_OTHER): Payer: Self-pay | Admitting: *Deleted

## 2023-02-13 LAB — HM COLONOSCOPY

## 2023-02-26 ENCOUNTER — Ambulatory Visit (INDEPENDENT_AMBULATORY_CARE_PROVIDER_SITE_OTHER): Payer: 59

## 2023-02-26 DIAGNOSIS — I639 Cerebral infarction, unspecified: Secondary | ICD-10-CM | POA: Diagnosis not present

## 2023-02-27 LAB — CUP PACEART REMOTE DEVICE CHECK
Date Time Interrogation Session: 20240831230658
Implantable Pulse Generator Implant Date: 20210211

## 2023-02-28 DIAGNOSIS — I10 Essential (primary) hypertension: Secondary | ICD-10-CM | POA: Diagnosis not present

## 2023-02-28 DIAGNOSIS — E1142 Type 2 diabetes mellitus with diabetic polyneuropathy: Secondary | ICD-10-CM | POA: Diagnosis not present

## 2023-03-06 ENCOUNTER — Ambulatory Visit: Payer: 59 | Admitting: Internal Medicine

## 2023-03-06 ENCOUNTER — Encounter: Payer: Self-pay | Admitting: Internal Medicine

## 2023-03-06 NOTE — Progress Notes (Signed)
Carelink Summary Report / Loop Recorder 

## 2023-03-20 ENCOUNTER — Ambulatory Visit: Payer: 59 | Admitting: Internal Medicine

## 2023-03-26 ENCOUNTER — Encounter: Payer: Self-pay | Admitting: Internal Medicine

## 2023-03-30 DIAGNOSIS — E1142 Type 2 diabetes mellitus with diabetic polyneuropathy: Secondary | ICD-10-CM | POA: Diagnosis not present

## 2023-03-30 DIAGNOSIS — I10 Essential (primary) hypertension: Secondary | ICD-10-CM | POA: Diagnosis not present

## 2023-04-01 ENCOUNTER — Ambulatory Visit (INDEPENDENT_AMBULATORY_CARE_PROVIDER_SITE_OTHER): Payer: 59

## 2023-04-01 DIAGNOSIS — I639 Cerebral infarction, unspecified: Secondary | ICD-10-CM | POA: Diagnosis not present

## 2023-04-01 LAB — CUP PACEART REMOTE DEVICE CHECK
Date Time Interrogation Session: 20241006231331
Implantable Pulse Generator Implant Date: 20210211

## 2023-04-12 NOTE — Progress Notes (Signed)
Carelink Summary Report / Loop Recorder 

## 2023-04-30 DIAGNOSIS — I10 Essential (primary) hypertension: Secondary | ICD-10-CM | POA: Diagnosis not present

## 2023-04-30 DIAGNOSIS — E1142 Type 2 diabetes mellitus with diabetic polyneuropathy: Secondary | ICD-10-CM | POA: Diagnosis not present

## 2023-05-06 ENCOUNTER — Ambulatory Visit (INDEPENDENT_AMBULATORY_CARE_PROVIDER_SITE_OTHER): Payer: 59

## 2023-05-06 DIAGNOSIS — I639 Cerebral infarction, unspecified: Secondary | ICD-10-CM | POA: Diagnosis not present

## 2023-05-06 LAB — CUP PACEART REMOTE DEVICE CHECK
Date Time Interrogation Session: 20241110231550
Implantable Pulse Generator Implant Date: 20210211

## 2023-05-29 NOTE — Progress Notes (Signed)
Carelink Summary Report / Loop Recorder 

## 2023-05-30 DIAGNOSIS — I10 Essential (primary) hypertension: Secondary | ICD-10-CM | POA: Diagnosis not present

## 2023-05-30 DIAGNOSIS — E785 Hyperlipidemia, unspecified: Secondary | ICD-10-CM | POA: Diagnosis not present

## 2023-05-30 DIAGNOSIS — E1165 Type 2 diabetes mellitus with hyperglycemia: Secondary | ICD-10-CM | POA: Diagnosis not present

## 2023-06-10 ENCOUNTER — Ambulatory Visit (INDEPENDENT_AMBULATORY_CARE_PROVIDER_SITE_OTHER): Payer: 59

## 2023-06-10 DIAGNOSIS — I639 Cerebral infarction, unspecified: Secondary | ICD-10-CM

## 2023-06-10 LAB — CUP PACEART REMOTE DEVICE CHECK
Date Time Interrogation Session: 20241215231013
Implantable Pulse Generator Implant Date: 20210211

## 2023-07-15 ENCOUNTER — Ambulatory Visit (INDEPENDENT_AMBULATORY_CARE_PROVIDER_SITE_OTHER): Payer: 59

## 2023-07-15 DIAGNOSIS — I639 Cerebral infarction, unspecified: Secondary | ICD-10-CM

## 2023-07-15 LAB — CUP PACEART REMOTE DEVICE CHECK
Date Time Interrogation Session: 20250119231520
Implantable Pulse Generator Implant Date: 20210211

## 2023-07-15 NOTE — Progress Notes (Signed)
Carelink Summary Report / Loop Recorder 

## 2023-07-31 DIAGNOSIS — I10 Essential (primary) hypertension: Secondary | ICD-10-CM | POA: Diagnosis not present

## 2023-07-31 DIAGNOSIS — E1142 Type 2 diabetes mellitus with diabetic polyneuropathy: Secondary | ICD-10-CM | POA: Diagnosis not present

## 2023-08-19 ENCOUNTER — Ambulatory Visit (INDEPENDENT_AMBULATORY_CARE_PROVIDER_SITE_OTHER): Payer: 59

## 2023-08-19 DIAGNOSIS — I639 Cerebral infarction, unspecified: Secondary | ICD-10-CM | POA: Diagnosis not present

## 2023-08-20 LAB — CUP PACEART REMOTE DEVICE CHECK
Date Time Interrogation Session: 20250223231146
Implantable Pulse Generator Implant Date: 20210211

## 2023-08-21 NOTE — Progress Notes (Signed)
 Carelink Summary Report / Loop Recorder

## 2023-08-22 ENCOUNTER — Ambulatory Visit: Payer: 59 | Admitting: Cardiology

## 2023-09-02 ENCOUNTER — Emergency Department (HOSPITAL_COMMUNITY)
Admission: EM | Admit: 2023-09-02 | Discharge: 2023-09-03 | Disposition: A | Discharge status: Home / Self Care | Attending: Emergency Medicine | Admitting: Emergency Medicine

## 2023-09-02 ENCOUNTER — Other Ambulatory Visit: Payer: Self-pay

## 2023-09-02 ENCOUNTER — Emergency Department (HOSPITAL_COMMUNITY)

## 2023-09-02 DIAGNOSIS — K5900 Constipation, unspecified: Secondary | ICD-10-CM | POA: Insufficient documentation

## 2023-09-02 DIAGNOSIS — Z794 Long term (current) use of insulin: Secondary | ICD-10-CM | POA: Insufficient documentation

## 2023-09-02 DIAGNOSIS — R109 Unspecified abdominal pain: Secondary | ICD-10-CM | POA: Insufficient documentation

## 2023-09-02 DIAGNOSIS — K5641 Fecal impaction: Secondary | ICD-10-CM | POA: Diagnosis not present

## 2023-09-02 DIAGNOSIS — R140 Abdominal distension (gaseous): Secondary | ICD-10-CM | POA: Diagnosis not present

## 2023-09-02 DIAGNOSIS — I10 Essential (primary) hypertension: Secondary | ICD-10-CM | POA: Diagnosis not present

## 2023-09-02 DIAGNOSIS — Z7984 Long term (current) use of oral hypoglycemic drugs: Secondary | ICD-10-CM | POA: Diagnosis not present

## 2023-09-02 DIAGNOSIS — E119 Type 2 diabetes mellitus without complications: Secondary | ICD-10-CM | POA: Insufficient documentation

## 2023-09-02 DIAGNOSIS — Z79899 Other long term (current) drug therapy: Secondary | ICD-10-CM | POA: Insufficient documentation

## 2023-09-02 LAB — CBG MONITORING, ED: Glucose-Capillary: 253 mg/dL — ABNORMAL HIGH (ref 70–99)

## 2023-09-02 NOTE — ED Triage Notes (Signed)
 Pt c/o lower abdominal pain, reports she has not had a bowel movement x1 week. States her stomach feels distended

## 2023-09-03 DIAGNOSIS — K5900 Constipation, unspecified: Secondary | ICD-10-CM | POA: Diagnosis not present

## 2023-09-03 LAB — COMPREHENSIVE METABOLIC PANEL
ALT: 14 U/L (ref 0–44)
AST: 12 U/L — ABNORMAL LOW (ref 15–41)
Albumin: 3.8 g/dL (ref 3.5–5.0)
Alkaline Phosphatase: 107 U/L (ref 38–126)
Anion gap: 9 (ref 5–15)
BUN: 16 mg/dL (ref 8–23)
CO2: 21 mmol/L — ABNORMAL LOW (ref 22–32)
Calcium: 9.2 mg/dL (ref 8.9–10.3)
Chloride: 102 mmol/L (ref 98–111)
Creatinine, Ser: 0.9 mg/dL (ref 0.44–1.00)
GFR, Estimated: >60 mL/min (ref >60)
Glucose, Bld: 302 mg/dL — ABNORMAL HIGH (ref 70–99)
Potassium: 4.9 mmol/L (ref 3.5–5.1)
Sodium: 132 mmol/L — ABNORMAL LOW (ref 135–145)
Total Bilirubin: 0.4 mg/dL (ref 0.0–1.2)
Total Protein: 7.4 g/dL (ref 6.5–8.1)

## 2023-09-03 LAB — CBC
HCT: 35.5 % — ABNORMAL LOW (ref 36.0–46.0)
Hemoglobin: 11.7 g/dL — ABNORMAL LOW (ref 12.0–15.0)
MCH: 27.9 pg (ref 26.0–34.0)
MCHC: 33 g/dL (ref 30.0–36.0)
MCV: 84.5 fL (ref 80.0–100.0)
Platelets: 279 10*3/uL (ref 150–400)
RBC: 4.2 MIL/uL (ref 3.87–5.11)
RDW: 15.2 % (ref 11.5–15.5)
WBC: 5.6 10*3/uL (ref 4.0–10.5)
nRBC: 0 % (ref 0.0–0.2)

## 2023-09-03 LAB — LIPASE, BLOOD: Lipase: 22 U/L (ref 11–51)

## 2023-09-03 MED ORDER — MINERAL OIL RE ENEM
1.0000 | ENEMA | Freq: Once | RECTAL | Status: AC
  Administered 2023-09-03: 1 via RECTAL
  Filled 2023-09-03: qty 1

## 2023-09-03 NOTE — Discharge Instructions (Signed)
 Return to the emergency department if you develop any new and/or concerning issues.

## 2023-09-03 NOTE — ED Provider Notes (Signed)
 [place] EMERGENCY DEPARTMENT AT [facility] Provider Note   CSN: [id number] Arrival date & time: 09/02/23  2254     History  Chief Complaint  Patient presents with   Constipation   Abdominal Pain    Jean Watts is a 83 y.o. female.  Patient is an 83 year old female with past medical history of hypertension, hyperlipidemia, type 2 diabetes, prior hysterectomy.  Patient presenting today with complaints of constipation.  She reports she has not had a bowel movement in nearly 1 week.  She has tried oral medications with little relief.  No fevers or chills.  She does feel as though her abdomen is bloated.  The history is provided by the patient.       Home Medications Prior to Admission medications   Medication Sig Start Date End Date Taking? Authorizing Provider  ACCU-CHEK AVIVA PLUS test strip  05/09/20   [provider]  acetaminophen (TYLENOL) 500 MG tablet Take 500 mg by mouth every 6 (six) hours as needed for moderate pain.    [provider]  albuterol (PROVENTIL) (2.5 MG/3ML) 0.083% nebulizer solution Inhale 2.5 mg into the lungs every 6 (six) hours as needed for wheezing or shortness of breath.  01/29/19   [provider]  amitriptyline (ELAVIL) 50 MG tablet Take 50 mg by mouth at bedtime.  02/17/19   [provider]  amLODipine-benazepril (LOTREL) 5-10 MG capsule Take 1 capsule by mouth daily. 05/03/20   Shon Hale, MD  aspirin 325 MG tablet Take 1 tablet (325 mg total) by mouth daily with breakfast. 05/03/20   Shon Hale, MD  BD INSULIN SYRINGE U/F 31G X 5/16" 1 ML MISC 2 (two) times daily. as directed 03/02/20   [provider]  gabapentin (NEURONTIN) 400 MG capsule Take 400 mg by mouth 3 (three) times daily.    [provider]  HUMULIN N 100 UNIT/ML injection Inject 0.15-0.35 mLs (15-35 Units total) into the skin See admin instructions. Inject 35 units into the skin in the morning and 15 units in  the evening Patient taking differently: Inject 20-40 Units into the skin See admin instructions. Inject 40 units into the skin in the morning and 20 units in the evening 05/03/20   Shon Hale, MD  meclizine (ANTIVERT) 25 MG tablet Take 1 tablet (25 mg total) by mouth 3 (three) times daily as needed for dizziness. 08/02/22   Mesner, Barbara Cower, MD  metFORMIN (GLUCOPHAGE) 1000 MG tablet Take 1,000 mg by mouth 2 (two) times daily.  03/09/19   [provider]  metoprolol tartrate (LOPRESSOR) 25 MG tablet Take 25 mg by mouth 2 (two) times daily. 06/04/20   [provider]  RESTASIS 0.05 % ophthalmic emulsion Place 1 drop into both eyes 2 (two) times daily.  04/06/20   [provider]      Allergies    Patient has no known allergies.    Review of Systems   Review of Systems  All other systems reviewed and are negative.   Physical Exam Updated Vital Signs BP (!) 142/74 (BP Location: Right Arm)   Pulse 84   Temp 98.2 F (36.8 C) (Oral)   Resp 18   SpO2 97%  Physical Exam Vitals and nursing note reviewed.  Constitutional:      General: She is not in acute distress.    Appearance: She is well-developed. She is not diaphoretic.  HENT:     Head: Normocephalic and atraumatic.  Cardiovascular:     Rate  and Rhythm: Normal rate and regular rhythm.     Heart sounds: No murmur heard.    No friction rub. No gallop.  Pulmonary:     Effort: Pulmonary effort is normal. No respiratory distress.     Breath sounds: Normal breath sounds. No wheezing.  Abdominal:     General: Bowel sounds are normal. There is no distension.     Palpations: Abdomen is soft.     Tenderness: There is no abdominal tenderness.  Musculoskeletal:        General: Normal range of motion.     Cervical back: Normal range of motion and neck supple.  Skin:    General: Skin is warm and dry.  Neurological:     General: No focal deficit present.     Mental Status: She is alert and oriented to person,  place, and time.     ED Results / Procedures / Treatments   Labs (all labs ordered are listed, but only abnormal results are displayed) Labs Reviewed  COMPREHENSIVE METABOLIC PANEL - Abnormal; Notable for the following components:      Result Value   Sodium 132 (*)    CO2 21 (*)    Glucose, Bld 302 (*)    AST 12 (*)    All other components within normal limits  CBC - Abnormal; Notable for the following components:   Hemoglobin 11.7 (*)    HCT 35.5 (*)    All other components within normal limits  CBG MONITORING, ED - Abnormal; Notable for the following components:   Glucose-Capillary 253 (*)    All other components within normal limits  LIPASE, BLOOD  URINALYSIS, ROUTINE W REFLEX MICROSCOPIC    EKG None  Radiology DG Abd Portable 1 View Result Date: 09/03/2023 CLINICAL DATA:  No bowel movement for 1 week. EXAM: PORTABLE ABDOMEN - 1 VIEW COMPARISON:  Abdominal x-ray 12/11/2022 FINDINGS: The entire colon is distended and filled with a large amount of stool. Colonic loops measure up to 6 cm, mildly dilated. No dilated small bowel visualized. No radio-opaque calculi or other significant radiographic abnormality are seen. IMPRESSION: The entire colon is distended and filled with a large amount of stool. Electronically Signed   By: Darliss Cheney M.D.   On: 09/03/2023 01:53    Procedures Procedures    Medications Ordered in ED Medications  mineral oil enema 1 enema (has no administration in time range)    ED Course/ Medical Decision Making/ A&P  Patient is an 83 year old female presenting with complaints of constipation as described in the HPI.  She also describes abdominal distention and not having had a bowel movement in 1 week.  She arrives with stable vital signs and is afebrile.  Abdomen somewhat full, but benign.  Laboratory studies obtained in triage including CBC, CMP, and lipase, all of which are unremarkable.  Plain films of the abdomen show a distended colon with  large amounts of stool.  Patient was given an enema here in the ER with good results.  She has had a very large bowel movement and feels considerably improved.  Patient will be discharged with as needed return.  Final Clinical Impression(s) / ED Diagnoses Final diagnoses:  None    Rx / DC Orders ED Discharge Orders     None         Geoffery Lyons, MD 09/03/23 0501

## 2023-09-03 NOTE — ED Notes (Signed)
 AC to bring enema

## 2023-09-23 ENCOUNTER — Encounter: Payer: Self-pay | Admitting: Internal Medicine

## 2023-09-23 ENCOUNTER — Ambulatory Visit (INDEPENDENT_AMBULATORY_CARE_PROVIDER_SITE_OTHER): Payer: 59

## 2023-09-23 ENCOUNTER — Ambulatory Visit: Payer: 59 | Attending: Internal Medicine | Admitting: Internal Medicine

## 2023-09-23 VITALS — BP 118/56 | HR 85 | Ht 64.0 in | Wt 217.0 lb

## 2023-09-23 DIAGNOSIS — R079 Chest pain, unspecified: Secondary | ICD-10-CM

## 2023-09-23 DIAGNOSIS — I639 Cerebral infarction, unspecified: Secondary | ICD-10-CM

## 2023-09-23 DIAGNOSIS — R011 Cardiac murmur, unspecified: Secondary | ICD-10-CM

## 2023-09-23 NOTE — Progress Notes (Signed)
 Cardiology Office Note  Date: 09/23/2023   ID: Jean Watts [birth year], MRN [id number]  PCP:  Benetta Spar, MD  Cardiologist:  Marjo Bicker, MD Electrophysiologist:  None   History of Present Illness: Jean Watts is a 83 y.o. female known to have a history of cryptogenic stroke s/p loop recorder placement in 2021, HTN, HLD is here for follow-up visit.  She has chest pains in the right side of her chest lasting for about 1 minute, with exertion and sometimes with rest.  Occurs once a week.  Ongoing for the last 1 year.  She has no other symptoms of DOE, dizziness, syncope, palpitations, leg swelling.  Not active at baseline.  Past Medical History:  Diagnosis Date   Arthritis    Dyspnea    Headache    History of dizziness    HLD (hyperlipidemia)    HTN (hypertension)    Neuropathy    Stroke (HCC)    no residual    Type 2 diabetes mellitus with diabetic polyneuropathy (HCC)    Visual loss    right eye    Past Surgical History:  Procedure Laterality Date   ABDOMINAL HYSTERECTOMY     BREAST BIOPSY  02/2019   BREAST LUMPECTOMY WITH RADIOACTIVE SEED LOCALIZATION Right 12/22/2019   Procedure: RIGHT BREAST LUMPECTOMY X 2 WITH RADIOACTIVE SEED LOCALIZATION;  Surgeon: Abigail Miyamoto, MD;  Location: [facility];  Service: General;  Laterality: Right;   FLEXIBLE SIGMOIDOSCOPY N/A 06/04/2019   Procedure: FLEXIBLE SIGMOIDOSCOPY;  Surgeon: Corbin Ade, MD;  Location: [facility];  Service: Endoscopy;  Laterality: N/A;  incomplete colonoscopy, poor prep   IR CT HEAD LTD  08/04/2019   IR PERCUTANEOUS ART THROMBECTOMY/INFUSION INTRACRANIAL INC DIAG ANGIO  08/04/2019   LOOP RECORDER INSERTION N/A 08/06/2019   Procedure: LOOP RECORDER INSERTION;  Surgeon: Hillis Range, MD;  Location: [facility];  Service: Cardiovascular;  Laterality: N/A;   RADIOLOGY WITH ANESTHESIA N/A 08/04/2019   Procedure: IR WITH ANESTHESIA;  Surgeon: Radiologist,  Medication, MD;  Location: [facility] OR;  Service: Radiology;  Laterality: N/A;   ROBOTIC ASSISTED BILATERAL SALPINGO OOPHERECTOMY Bilateral 07/05/2020   Procedure: XI ROBOTIC ASSISTED RIGHT SALPINGO OOPHORECTOMY WITH MINI LAPAROTOMY AND LYSIS OF ADHESIONS;  Surgeon: Adolphus Birchwood, MD;  Location: [facility];  Service: Gynecology;  Laterality: Bilateral;    Current Outpatient Medications  Medication Sig Dispense Refill   ACCU-CHEK AVIVA PLUS test strip      acetaminophen (TYLENOL) 500 MG tablet Take 500 mg by mouth every 6 (six) hours as needed for moderate pain.     albuterol (PROVENTIL) (2.5 MG/3ML) 0.083% nebulizer solution Inhale 2.5 mg into the lungs every 6 (six) hours as needed for wheezing or shortness of breath.      amitriptyline (ELAVIL) 50 MG tablet Take 50 mg by mouth at bedtime.      amLODipine-benazepril (LOTREL) 5-10 MG capsule Take 1 capsule by mouth daily. 30 capsule 3   aspirin 325 MG tablet Take 1 tablet (325 mg total) by mouth daily with breakfast. 120 tablet 3   BD INSULIN SYRINGE U/F 31G X 5/16" 1 ML MISC 2 (two) times daily. as directed     gabapentin (NEURONTIN) 400 MG capsule Take 400 mg by mouth 3 (three) times daily.     HUMULIN N 100 UNIT/ML injection Inject 0.15-0.35 mLs (15-35 Units total) into the skin See admin instructions. Inject 35 units into the skin in the morning and 15 units in the  evening (Patient taking differently: Inject 20-40 Units into the skin See admin instructions. Inject 40 units into the skin in the morning and 20 units in the evening) 10 mL 11   meclizine (ANTIVERT) 25 MG tablet Take 1 tablet (25 mg total) by mouth 3 (three) times daily as needed for dizziness. 30 tablet 0   metFORMIN (GLUCOPHAGE) 1000 MG tablet Take 1,000 mg by mouth 2 (two) times daily.      metoprolol tartrate (LOPRESSOR) 25 MG tablet Take 25 mg by mouth 2 (two) times daily.     RESTASIS 0.05 % ophthalmic emulsion Place 1 drop into both eyes 2 (two) times daily.      No current  facility-administered medications for this visit.   Allergies:  Patient has no known allergies.   Social History: The patient  reports that she has never smoked. She has never used smokeless tobacco. She reports that she does not drink alcohol and does not use drugs.   Family History: The patient's family history includes Breast cancer (age of onset: 61) in her daughter; Cancer in her mother; Cancer (age of onset: 49) in her daughter; Heart attack in her father; Lung cancer in her brother; Prostate cancer in her brother.   ROS:  Please see the history of present illness. Otherwise, complete review of systems is positive for none.  All other systems are reviewed and negative.   Physical Exam: VS:  BP (!) 118/56 (BP Location: Left Arm, Patient Position: Sitting, Cuff Size: Large)   Pulse 85   Ht 5\' 4"  (1.626 m)   Wt 217 lb (98.4 kg)   SpO2 98%   BMI 37.25 kg/m , BMI Body mass index is 37.25 kg/m.  Wt Readings from Last 3 Encounters:  09/23/23 217 lb (98.4 kg)  12/11/22 270 lb (122.5 kg)  08/02/22 270 lb (122.5 kg)    General: Patient appears comfortable at rest. HEENT: Conjunctiva and lids normal, oropharynx clear with moist mucosa. Neck: Supple, no elevated JVP or carotid bruits, no thyromegaly. Lungs: Clear to auscultation, nonlabored breathing at rest. Cardiac: Regular rate and rhythm, systolic murmur present, no pericardial rub. Abdomen: Soft, nontender, no hepatomegaly, bowel sounds present, no guarding or rebound. Extremities: No pitting edema, distal pulses 2+. Skin: Warm and dry. Musculoskeletal: No kyphosis. Neuropsychiatric: Alert and oriented x3, affect grossly appropriate.  Recent Labwork: 09/03/2023: ALT 14; AST 12; BUN 16; Creatinine, Ser 0.90; Hemoglobin 11.7; Platelets 279; Potassium 4.9; Sodium 132     Component Value Date/Time   CHOL 184 08/05/2019 0500   TRIG 40 08/05/2019 0500   TRIG 41 08/05/2019 0500   HDL 47 08/05/2019 0500   CHOLHDL 3.9 08/05/2019 0500    VLDL 8 08/05/2019 0500   LDLCALC 129 (H) 08/05/2019 0500     Assessment and Plan:  Chest pain: Ongoing for the last 1 year, occurs once a week, occurs mostly with exertion symptoms with rest.  Due to advanced age, will obtain Lexiscan.  Cardiac murmur: Systolic murmur on examination.  Will obtain echocardiogram.  HTN, controlled: Continue current antihypertensives, amlodipine-benazepril 5-10 mg once daily and metoprolol tartrate 25 mg twice daily.  History of cryptogenic stroke s/p ILD in 2021: No evidence of atrial arrhythmias till date.  Currently on aspirin 325 mg once daily, will need to switch to aspirin 81 mg once daily.  Not on statin, unknown lipid values.  HLD, known values: Not on statin.  Goal LDL is 70.    Medication Adjustments/Labs and Tests Ordered: Current medicines are  reviewed at length with the patient today.  Concerns regarding medicines are outlined above.    Disposition:  Follow up pending results  Signed, Lindora Alviar Verne Spurr, MD, 09/23/2023 9:15 AM    [facility] [address] [place], [place] [address]

## 2023-09-23 NOTE — Patient Instructions (Signed)
 Medication Instructions:  Your physician recommends that you continue on your current medications as directed. Please refer to the Current Medication list given to you today.  *If you need a refill on your cardiac medications before your next appointment, please call your pharmacy*  Lab Work: None  If you have labs (blood work) drawn today and your tests are completely normal, you will receive your results only by: MyChart Message (if you have MyChart) OR A paper copy in the mail If you have any lab test that is abnormal or we need to change your treatment, we will call you to review the results.  Testing/Procedures: Your physician has requested that you have an echocardiogram. Echocardiography is a painless test that uses sound waves to create images of your heart. It provides your doctor with information about the size and shape of your heart and how well your heart's chambers and valves are working. This procedure takes approximately one hour. There are no restrictions for this procedure. Please do NOT wear cologne, perfume, aftershave, or lotions (deodorant is allowed). Please arrive 15 minutes prior to your appointment time.  Please note: We ask at that you not bring children with you during ultrasound (echo/ vascular) testing. Due to room size and safety concerns, children are not allowed in the ultrasound rooms during exams. Our front office staff cannot provide observation of children in our lobby area while testing is being conducted. An adult accompanying a patient to their appointment will only be allowed in the ultrasound room at the discretion of the ultrasound technician under special circumstances. We apologize for any inconvenience.  Your physician has requested that you have a lexiscan myoview. For further information please visit [email or web address] Please follow instruction sheet, as given.   Follow-Up: At [facility], you and your health needs are our priority.   As part of our continuing mission to provide you with exceptional heart care, our providers are all part of one team.  This team includes your primary Cardiologist (physician) and Advanced Practice Providers or APPs (Physician Assistants and Nurse Practitioners) who all work together to provide you with the care you need, when you need it.  Your next appointment:   Pending Results   Provider:   You may see Vishnu P Mallipeddi, MD or one of the following Advanced Practice Providers on your designated Care Team:   [place], PA-C  [place] Gloria [place], [place] Jacolyn Reedy, [place]     We recommend signing up for the patient portal called "MyChart".  Sign up information is provided on this After Visit Summary.  MyChart is used to connect with patients for Virtual Visits (Telemedicine).  Patients are able to view lab/test results, encounter notes, upcoming appointments, etc.  Non-urgent messages can be sent to your provider as well.   To learn more about what you can do with MyChart, go to [email or web address].   Other Instructions

## 2023-09-24 LAB — CUP PACEART REMOTE DEVICE CHECK
Date Time Interrogation Session: 20250330231009
Implantable Pulse Generator Implant Date: 20210211

## 2023-09-24 NOTE — Addendum Note (Signed)
 Addended by: Geralyn Flash D on: 09/24/2023 12:32 PM   Modules accepted: Orders

## 2023-09-24 NOTE — Progress Notes (Signed)
 Carelink Summary Report / Loop Recorder

## 2023-10-03 ENCOUNTER — Telehealth (HOSPITAL_COMMUNITY): Payer: Self-pay | Admitting: Surgery

## 2023-10-03 NOTE — Telephone Encounter (Signed)
 I was calling to remind the pt of her stress test tomorrow, but the number was not in service.  I then called the pt's daughter Delton [place], and I went over the pt's arrival time, which is 8:15 since she has an echo first.  I went over the following instructions with Elease Hashimoto:  the pt should not eat/drink 6 hrs prior to the test, no smoking 8 hrs prior to the test, wear comfortable clothes, do not wear any lotions/oils, hold the pt's diabetes medication tomorrow morning (Metformin and insulin), follow any other medication instructions from the cardiologist, and check in at the front desk upon arrival.  I also discussed the most common side effects of Lexiscan with Elease Hashimoto and she voiced understanding of all the instructions.

## 2023-10-04 ENCOUNTER — Encounter (HOSPITAL_COMMUNITY)

## 2023-10-04 ENCOUNTER — Encounter (HOSPITAL_COMMUNITY): Admission: RE | Admit: 2023-10-04 | Source: Ambulatory Visit

## 2023-10-04 ENCOUNTER — Ambulatory Visit (HOSPITAL_COMMUNITY): Admission: RE | Admit: 2023-10-04 | Source: Ambulatory Visit

## 2023-10-23 ENCOUNTER — Telehealth (HOSPITAL_COMMUNITY): Payer: Self-pay | Admitting: Surgery

## 2023-10-23 NOTE — Telephone Encounter (Signed)
 I called the pt to remind her of her stress test tomorrow, and since she has an echo her arrival time needs to be 8:15.  I left a VM with the following instructions:  nothing to eat/drink 6 hrs prior to the test, no smoking 8 hrs prior to the test, wear comfortable clothes, do not wear any lotions/oils, hold your insulin  and Metformin tomorrow morning, and check in at the front desk upon arrival.  I told the pt to call back if she had any questions.

## 2023-10-24 ENCOUNTER — Encounter (HOSPITAL_COMMUNITY): Admission: RE | Admit: 2023-10-24 | Source: Ambulatory Visit

## 2023-10-24 ENCOUNTER — Encounter (HOSPITAL_COMMUNITY)

## 2023-10-24 ENCOUNTER — Encounter (HOSPITAL_COMMUNITY): Payer: Self-pay | Admitting: Surgery

## 2023-10-24 ENCOUNTER — Ambulatory Visit (HOSPITAL_COMMUNITY)
Admission: RE | Admit: 2023-10-24 | Discharge: 2023-10-24 | Disposition: A | Discharge status: Home / Self Care | Source: Ambulatory Visit | Attending: Internal Medicine | Admitting: Internal Medicine

## 2023-10-24 DIAGNOSIS — R079 Chest pain, unspecified: Secondary | ICD-10-CM | POA: Diagnosis not present

## 2023-10-24 LAB — GLUCOSE, CAPILLARY
Glucose-Capillary: 55 mg/dL — ABNORMAL LOW (ref 70–99)
Glucose-Capillary: 92 mg/dL (ref 70–99)

## 2023-10-24 LAB — ECHOCARDIOGRAM COMPLETE
AR max vel: 1.5 cm2
AV Area VTI: 1.72 cm2
AV Area mean vel: 1.63 cm2
AV Mean grad: 13 mmHg
AV Peak grad: 22.9 mmHg
Ao pk vel: 2.4 m/s
Area-P 1/2: 3.08 cm2
S' Lateral: 2.25 cm

## 2023-10-24 NOTE — Progress Notes (Signed)
*  PRELIMINARY RESULTS* Echocardiogram 2D Echocardiogram has been performed.  Jean Watts 10/24/2023, 9:28 AM

## 2023-10-24 NOTE — Progress Notes (Signed)
 Pt was here for an echo and stress test today.  She told the echo technician that her blood sugar this morning was 70, and that her head "was feeling funny."  I notified [place], PA-C, who asked that the pt drink half of an orange juice and have her blood sugar rechecked in 15 minutes.  On the recheck her blood sugar had dropped to 55 and she was still not feeling well.  I notified [place], and she ordered for the pt's stress test to be rescheduled and for her Humulin  insulin  to be held the night before and the morning of the stress test.  The pt's Metformin just needs to held the morning of the test.  I gave the patient some crackers and a sprite and rechecked her blood sugar again after 15 minutes.  The pt's blood sugar came up to 92 and I walked them over to the cardiology office to reschedule.  The instructions regarding the pt's insulin  and Metformin were given to the cardiology office.

## 2023-10-28 ENCOUNTER — Ambulatory Visit: Payer: 59

## 2023-10-28 DIAGNOSIS — I639 Cerebral infarction, unspecified: Secondary | ICD-10-CM | POA: Diagnosis not present

## 2023-10-28 LAB — CUP PACEART REMOTE DEVICE CHECK
Date Time Interrogation Session: 20250504231810
Implantable Pulse Generator Implant Date: 20210211

## 2023-10-30 ENCOUNTER — Encounter (HOSPITAL_COMMUNITY): Admission: RE | Admit: 2023-10-30 | Source: Ambulatory Visit

## 2023-10-30 ENCOUNTER — Encounter (HOSPITAL_COMMUNITY)

## 2023-11-05 ENCOUNTER — Ambulatory Visit: Payer: Self-pay | Admitting: Cardiovascular Disease

## 2023-11-06 NOTE — Progress Notes (Signed)
 Carelink Summary Report / Loop Recorder

## 2023-11-06 NOTE — Addendum Note (Signed)
 Addended by: Edra Govern D on: 11/06/2023 11:26 AM   Modules accepted: Orders

## 2023-11-18 ENCOUNTER — Encounter (HOSPITAL_COMMUNITY)

## 2023-11-19 ENCOUNTER — Inpatient Hospital Stay (HOSPITAL_COMMUNITY): Admission: RE | Admit: 2023-11-19 | Source: Ambulatory Visit

## 2023-11-19 ENCOUNTER — Encounter (HOSPITAL_COMMUNITY): Admission: RE | Admit: 2023-11-19 | Source: Ambulatory Visit

## 2023-11-19 ENCOUNTER — Telehealth: Payer: Self-pay | Admitting: Internal Medicine

## 2023-11-19 NOTE — Telephone Encounter (Signed)
 I will inform the MD and Practice Managers

## 2023-11-19 NOTE — Telephone Encounter (Signed)
 Pt has cancelled/no showed

## 2023-11-19 NOTE — Telephone Encounter (Signed)
 I will forward to the provider. I would not r/s until advised otherwise.    Does patient give reasons for cancelling?

## 2023-11-19 NOTE — Telephone Encounter (Signed)
 This pt has cancelled/no showed 3 times for her testing. Should we continue to r/s her?

## 2023-11-22 DIAGNOSIS — I10 Essential (primary) hypertension: Secondary | ICD-10-CM | POA: Diagnosis not present

## 2023-11-22 DIAGNOSIS — E1165 Type 2 diabetes mellitus with hyperglycemia: Secondary | ICD-10-CM | POA: Diagnosis not present

## 2023-11-22 DIAGNOSIS — R600 Localized edema: Secondary | ICD-10-CM | POA: Diagnosis not present

## 2023-11-22 DIAGNOSIS — Z0001 Encounter for general adult medical examination with abnormal findings: Secondary | ICD-10-CM | POA: Diagnosis not present

## 2023-11-22 DIAGNOSIS — E785 Hyperlipidemia, unspecified: Secondary | ICD-10-CM | POA: Diagnosis not present

## 2023-11-22 DIAGNOSIS — Z1389 Encounter for screening for other disorder: Secondary | ICD-10-CM | POA: Diagnosis not present

## 2023-11-22 NOTE — Telephone Encounter (Signed)
 noted

## 2023-11-28 ENCOUNTER — Ambulatory Visit: Payer: Self-pay | Admitting: Cardiovascular Disease

## 2023-11-28 ENCOUNTER — Ambulatory Visit (INDEPENDENT_AMBULATORY_CARE_PROVIDER_SITE_OTHER)

## 2023-11-28 DIAGNOSIS — I639 Cerebral infarction, unspecified: Secondary | ICD-10-CM | POA: Diagnosis not present

## 2023-11-28 LAB — CUP PACEART REMOTE DEVICE CHECK
Date Time Interrogation Session: 20250604231722
Implantable Pulse Generator Implant Date: 20210211

## 2023-12-13 NOTE — Progress Notes (Signed)
 Carelink Summary Report / Loop Recorder

## 2023-12-30 ENCOUNTER — Ambulatory Visit

## 2023-12-30 DIAGNOSIS — I639 Cerebral infarction, unspecified: Secondary | ICD-10-CM

## 2023-12-30 LAB — CUP PACEART REMOTE DEVICE CHECK
Date Time Interrogation Session: 20250705231018
Implantable Pulse Generator Implant Date: 20210211

## 2023-12-31 ENCOUNTER — Ambulatory Visit: Payer: Self-pay | Admitting: Cardiovascular Disease

## 2024-01-13 ENCOUNTER — Emergency Department (HOSPITAL_COMMUNITY)

## 2024-01-13 ENCOUNTER — Encounter (HOSPITAL_COMMUNITY): Payer: Self-pay | Admitting: Emergency Medicine

## 2024-01-13 ENCOUNTER — Inpatient Hospital Stay (HOSPITAL_COMMUNITY)
Admission: EM | Admit: 2024-01-13 | Discharge: 2024-01-15 | DRG: 641 | Disposition: A | Discharge status: Home Health | Attending: Family Medicine | Admitting: Family Medicine

## 2024-01-13 ENCOUNTER — Other Ambulatory Visit: Payer: Self-pay

## 2024-01-13 DIAGNOSIS — Z794 Long term (current) use of insulin: Secondary | ICD-10-CM

## 2024-01-13 DIAGNOSIS — I959 Hypotension, unspecified: Principal | ICD-10-CM | POA: Diagnosis present

## 2024-01-13 DIAGNOSIS — R579 Shock, unspecified: Secondary | ICD-10-CM | POA: Diagnosis not present

## 2024-01-13 DIAGNOSIS — Z79899 Other long term (current) drug therapy: Secondary | ICD-10-CM

## 2024-01-13 DIAGNOSIS — E861 Hypovolemia: Secondary | ICD-10-CM | POA: Diagnosis not present

## 2024-01-13 DIAGNOSIS — Z7982 Long term (current) use of aspirin: Secondary | ICD-10-CM

## 2024-01-13 DIAGNOSIS — R188 Other ascites: Secondary | ICD-10-CM | POA: Diagnosis present

## 2024-01-13 DIAGNOSIS — E1142 Type 2 diabetes mellitus with diabetic polyneuropathy: Secondary | ICD-10-CM | POA: Diagnosis not present

## 2024-01-13 DIAGNOSIS — E860 Dehydration: Secondary | ICD-10-CM | POA: Diagnosis not present

## 2024-01-13 DIAGNOSIS — Z8673 Personal history of transient ischemic attack (TIA), and cerebral infarction without residual deficits: Secondary | ICD-10-CM

## 2024-01-13 DIAGNOSIS — K529 Noninfective gastroenteritis and colitis, unspecified: Secondary | ICD-10-CM | POA: Diagnosis not present

## 2024-01-13 DIAGNOSIS — N179 Acute kidney failure, unspecified: Secondary | ICD-10-CM | POA: Diagnosis present

## 2024-01-13 DIAGNOSIS — R011 Cardiac murmur, unspecified: Secondary | ICD-10-CM | POA: Diagnosis present

## 2024-01-13 DIAGNOSIS — Z90710 Acquired absence of both cervix and uterus: Secondary | ICD-10-CM

## 2024-01-13 DIAGNOSIS — E785 Hyperlipidemia, unspecified: Secondary | ICD-10-CM | POA: Diagnosis present

## 2024-01-13 DIAGNOSIS — H5461 Unqualified visual loss, right eye, normal vision left eye: Secondary | ICD-10-CM | POA: Diagnosis present

## 2024-01-13 DIAGNOSIS — I10 Essential (primary) hypertension: Secondary | ICD-10-CM | POA: Diagnosis present

## 2024-01-13 DIAGNOSIS — Z90722 Acquired absence of ovaries, bilateral: Secondary | ICD-10-CM

## 2024-01-13 DIAGNOSIS — E872 Acidosis, unspecified: Secondary | ICD-10-CM | POA: Diagnosis present

## 2024-01-13 DIAGNOSIS — E1165 Type 2 diabetes mellitus with hyperglycemia: Secondary | ICD-10-CM | POA: Diagnosis present

## 2024-01-13 DIAGNOSIS — Z7984 Long term (current) use of oral hypoglycemic drugs: Secondary | ICD-10-CM

## 2024-01-13 DIAGNOSIS — R1900 Intra-abdominal and pelvic swelling, mass and lump, unspecified site: Secondary | ICD-10-CM | POA: Diagnosis present

## 2024-01-13 DIAGNOSIS — H5440 Blindness, one eye, unspecified eye: Secondary | ICD-10-CM | POA: Diagnosis present

## 2024-01-13 DIAGNOSIS — Z8249 Family history of ischemic heart disease and other diseases of the circulatory system: Secondary | ICD-10-CM

## 2024-01-13 LAB — COMPREHENSIVE METABOLIC PANEL WITH GFR
ALT: 16 U/L (ref 0–44)
AST: 22 U/L (ref 15–41)
Albumin: 3.2 g/dL — ABNORMAL LOW (ref 3.5–5.0)
Alkaline Phosphatase: 111 U/L (ref 38–126)
Anion gap: 9 (ref 5–15)
BUN: 15 mg/dL (ref 8–23)
CO2: 18 mmol/L — ABNORMAL LOW (ref 22–32)
Calcium: 8.2 mg/dL — ABNORMAL LOW (ref 8.9–10.3)
Chloride: 109 mmol/L (ref 98–111)
Creatinine, Ser: 1.16 mg/dL — ABNORMAL HIGH (ref 0.44–1.00)
GFR, Estimated: 47 mL/min — ABNORMAL LOW (ref >60)
Glucose, Bld: 228 mg/dL — ABNORMAL HIGH (ref 70–99)
Potassium: 4.2 mmol/L (ref 3.5–5.1)
Sodium: 136 mmol/L (ref 135–145)
Total Bilirubin: 1 mg/dL (ref 0.0–1.2)
Total Protein: 6.4 g/dL — ABNORMAL LOW (ref 6.5–8.1)

## 2024-01-13 LAB — URINALYSIS, W/ REFLEX TO CULTURE (INFECTION SUSPECTED)
Bilirubin Urine: NEGATIVE
Glucose, UA: NEGATIVE mg/dL
Hgb urine dipstick: NEGATIVE
Ketones, ur: NEGATIVE mg/dL
Leukocytes,Ua: NEGATIVE
Nitrite: NEGATIVE
Protein, ur: NEGATIVE mg/dL
Specific Gravity, Urine: 1.011 (ref 1.005–1.030)
pH: 5 (ref 5.0–8.0)

## 2024-01-13 LAB — CBC WITH DIFFERENTIAL/PLATELET
Abs Immature Granulocytes: 0.03 K/uL (ref 0.00–0.07)
Basophils Absolute: 0 K/uL (ref 0.0–0.1)
Basophils Relative: 0 %
Eosinophils Absolute: 0 K/uL (ref 0.0–0.5)
Eosinophils Relative: 0 %
HCT: 36.6 % (ref 36.0–46.0)
Hemoglobin: 11.7 g/dL — ABNORMAL LOW (ref 12.0–15.0)
Immature Granulocytes: 0 %
Lymphocytes Relative: 10 %
Lymphs Abs: 1.1 K/uL (ref 0.7–4.0)
MCH: 27.9 pg (ref 26.0–34.0)
MCHC: 32 g/dL (ref 30.0–36.0)
MCV: 87.4 fL (ref 80.0–100.0)
Monocytes Absolute: 0.3 K/uL (ref 0.1–1.0)
Monocytes Relative: 3 %
Neutro Abs: 9.2 K/uL — ABNORMAL HIGH (ref 1.7–7.7)
Neutrophils Relative %: 87 %
Platelets: 268 K/uL (ref 150–400)
RBC: 4.19 MIL/uL (ref 3.87–5.11)
RDW: 14.8 % (ref 11.5–15.5)
WBC: 10.6 K/uL — ABNORMAL HIGH (ref 4.0–10.5)
nRBC: 0 % (ref 0.0–0.2)

## 2024-01-13 LAB — TYPE AND SCREEN
ABO/RH(D): A POS
Antibody Screen: NEGATIVE

## 2024-01-13 LAB — PROTIME-INR
INR: 1.2 (ref 0.8–1.2)
Prothrombin Time: 15.8 s — ABNORMAL HIGH (ref 11.4–15.2)

## 2024-01-13 LAB — RESP PANEL BY RT-PCR (RSV, FLU A&B, COVID)  RVPGX2
Influenza A by PCR: NEGATIVE
Influenza B by PCR: NEGATIVE
Resp Syncytial Virus by PCR: NEGATIVE
SARS Coronavirus 2 by RT PCR: NEGATIVE

## 2024-01-13 LAB — LACTIC ACID, PLASMA
Lactic Acid, Venous: 3.3 mmol/L (ref 0.5–1.9)
Lactic Acid, Venous: 3.5 mmol/L (ref 0.5–1.9)
Lactic Acid, Venous: 2.8 mmol/L (ref 0.5–1.9)

## 2024-01-13 LAB — CBG MONITORING, ED: Glucose-Capillary: 239 mg/dL — ABNORMAL HIGH (ref 70–99)

## 2024-01-13 LAB — GLUCOSE, CAPILLARY: Glucose-Capillary: 271 mg/dL — ABNORMAL HIGH (ref 70–99)

## 2024-01-13 MED ORDER — ACETAMINOPHEN 325 MG PO TABS: 650.0000 mg | ORAL_TABLET | Freq: Four times a day (QID) | ORAL | Status: DC | PRN

## 2024-01-13 MED ORDER — ACETAMINOPHEN 650 MG RE SUPP: 650.0000 mg | Freq: Four times a day (QID) | RECTAL | Status: DC | PRN

## 2024-01-13 MED ORDER — METRONIDAZOLE 500 MG/100ML IV SOLN
500.0000 mg | Freq: Two times a day (BID) | INTRAVENOUS | Status: DC
  Administered 2024-01-14 – 2024-01-15 (×3): 500 mg via INTRAVENOUS
  Filled 2024-01-13 (×3): qty 100

## 2024-01-13 MED ORDER — VANCOMYCIN HCL 2000 MG/400ML IV SOLN
2000.0000 mg | Freq: Once | INTRAVENOUS | Status: AC
  Administered 2024-01-13: 2000 mg via INTRAVENOUS
  Filled 2024-01-13: qty 400

## 2024-01-13 MED ORDER — ONDANSETRON HCL 4 MG/2ML IJ SOLN: 4.0000 mg | Freq: Four times a day (QID) | INTRAMUSCULAR | Status: DC | PRN

## 2024-01-13 MED ORDER — GABAPENTIN 100 MG PO CAPS
100.0000 mg | ORAL_CAPSULE | Freq: Two times a day (BID) | ORAL | Status: DC
  Administered 2024-01-14 – 2024-01-15 (×3): 100 mg via ORAL
  Filled 2024-01-13 (×3): qty 1

## 2024-01-13 MED ORDER — IOHEXOL 350 MG/ML SOLN
100.0000 mL | Freq: Once | INTRAVENOUS | Status: AC | PRN
  Administered 2024-01-13: 100 mL via INTRAVENOUS

## 2024-01-13 MED ORDER — SODIUM CHLORIDE 0.9 % IV SOLN: INTRAVENOUS | Status: AC

## 2024-01-13 MED ORDER — GABAPENTIN 100 MG PO CAPS: 100.0000 mg | ORAL_CAPSULE | Freq: Three times a day (TID) | ORAL | Status: DC

## 2024-01-13 MED ORDER — VANCOMYCIN HCL IN DEXTROSE 1-5 GM/200ML-% IV SOLN: 1000.0000 mg | Freq: Once | INTRAVENOUS | Status: DC

## 2024-01-13 MED ORDER — ENOXAPARIN SODIUM 60 MG/0.6ML IJ SOSY
0.5000 mg/kg | PREFILLED_SYRINGE | INTRAMUSCULAR | Status: DC
  Administered 2024-01-13: 50 mg via SUBCUTANEOUS
  Filled 2024-01-13: qty 0.6

## 2024-01-13 MED ORDER — GABAPENTIN 300 MG PO CAPS
300.0000 mg | ORAL_CAPSULE | Freq: Every evening | ORAL | Status: DC
  Administered 2024-01-14: 300 mg via ORAL
  Filled 2024-01-13 (×2): qty 1

## 2024-01-13 MED ORDER — SODIUM CHLORIDE 0.9 % IV SOLN
2.0000 g | INTRAVENOUS | Status: DC
  Administered 2024-01-14: 2 g via INTRAVENOUS
  Filled 2024-01-13: qty 20

## 2024-01-13 MED ORDER — AMITRIPTYLINE HCL 25 MG PO TABS
50.0000 mg | ORAL_TABLET | Freq: Every day | ORAL | Status: DC
  Administered 2024-01-13 – 2024-01-14 (×2): 50 mg via ORAL
  Filled 2024-01-13 (×2): qty 2

## 2024-01-13 MED ORDER — SODIUM CHLORIDE 0.9 % IV BOLUS
2000.0000 mL | Freq: Once | INTRAVENOUS | Status: AC
  Administered 2024-01-13: 2000 mL via INTRAVENOUS

## 2024-01-13 MED ORDER — METRONIDAZOLE 500 MG/100ML IV SOLN
500.0000 mg | Freq: Once | INTRAVENOUS | Status: AC
  Administered 2024-01-13: 500 mg via INTRAVENOUS
  Filled 2024-01-13: qty 100

## 2024-01-13 MED ORDER — LACTATED RINGERS IV SOLN: INTRAVENOUS | Status: DC

## 2024-01-13 MED ORDER — SODIUM CHLORIDE 0.9 % IV SOLN
2.0000 g | Freq: Once | INTRAVENOUS | Status: AC
  Administered 2024-01-13: 2 g via INTRAVENOUS
  Filled 2024-01-13: qty 12.5

## 2024-01-13 MED ORDER — ONDANSETRON HCL 4 MG PO TABS: 4.0000 mg | ORAL_TABLET | Freq: Four times a day (QID) | ORAL | Status: DC | PRN

## 2024-01-13 MED ORDER — INSULIN ASPART 100 UNIT/ML IJ SOLN
0.0000 [IU] | Freq: Four times a day (QID) | INTRAMUSCULAR | Status: DC
  Administered 2024-01-13 – 2024-01-14 (×2): 8 [IU] via SUBCUTANEOUS
  Administered 2024-01-14 (×2): 5 [IU] via SUBCUTANEOUS
  Administered 2024-01-14: 8 [IU] via SUBCUTANEOUS
  Administered 2024-01-15: 5 [IU] via SUBCUTANEOUS

## 2024-01-13 NOTE — ED Notes (Signed)
 This tech cleaned up pt. Pt is now back in bed and covered with call light in reach.

## 2024-01-13 NOTE — H&P (Signed)
 History and Physical    Jean Watts FMW:[id number] DOB: Feb 12, 1941 DOA: 01/13/2024  PCP: Carlette Benita Area, MD   Patient coming from: Home  I have personally briefly reviewed patient's old medical records in [facility]  Chief Complaint: Weakness  HPI: Jean Watts is a 83 y.o. female with medical history significant for stroke, diabetes mellitus, hypertension, ovarian mass Patient presented to the ED via EMS for reports of generalized weakness and lower abdominal pain.  Daughter reports she came out of the shower and saw her mother awake but slumped back in the chair.  EMS reports blood pressure of 69/43. Daughter reports poor oral intake for several days due to poor appetite, 1 episode of vomiting last night.  Patient reports she has not had a bowel movement in 2 weeks.  In the ED here - patient had 3 large bowel movements the last 2 of which were watery. No fevers no chills.  No chest pain or difficulty breathing.  No urinary symptoms. Daughter- Jean Watts at bedside, denies history of any cancer in patient.   ED Course: Blood pressure down to 71/45.  Temp 98.  Heart rate 57-90.  Respiratory rate 18-28.  O2 sats 92 to 99%.  Lactic acid 3.3 > 3.5. 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  2L bolus given with improvement in blood pressure.  Broad-spectrum antibiotics IV vancomycin  metronidazole  cefepime  started.  Review of Systems: As per HPI all other systems reviewed and negative.  Past Medical History:  Diagnosis Date   Arthritis    Dyspnea    Headache    History of dizziness    HLD (hyperlipidemia)    HTN (hypertension)    Neuropathy    Stroke (HCC)    no residual    Type 2 diabetes mellitus with diabetic polyneuropathy (HCC)    Visual loss    right eye    Past Surgical History:  Procedure  Laterality Date   ABDOMINAL HYSTERECTOMY     BREAST BIOPSY  02/2019   BREAST LUMPECTOMY WITH RADIOACTIVE SEED LOCALIZATION Right 12/22/2019   Procedure: RIGHT BREAST LUMPECTOMY X 2 WITH RADIOACTIVE SEED LOCALIZATION;  Surgeon: Vernetta Berg, MD;  Location: [facility];  Service: General;  Laterality: Right;   FLEXIBLE SIGMOIDOSCOPY N/A 06/04/2019   Procedure: FLEXIBLE SIGMOIDOSCOPY;  Surgeon: Shaaron Lamar HERO, MD;  Location: [facility];  Service: Endoscopy;  Laterality: N/A;  incomplete colonoscopy, poor prep   IR CT HEAD LTD  08/04/2019   IR PERCUTANEOUS ART THROMBECTOMY/INFUSION INTRACRANIAL INC DIAG ANGIO  08/04/2019   LOOP RECORDER INSERTION N/A 08/06/2019   Procedure: LOOP RECORDER INSERTION;  Surgeon: Kelsie Agent, MD;  Location: [facility];  Service: Cardiovascular;  Laterality: N/A;   RADIOLOGY WITH ANESTHESIA N/A 08/04/2019   Procedure: IR WITH ANESTHESIA;  Surgeon: Radiologist, Medication, MD;  Location: [facility] OR;  Service: Radiology;  Laterality: N/A;   ROBOTIC ASSISTED BILATERAL SALPINGO OOPHERECTOMY Bilateral 07/05/2020   Procedure: XI ROBOTIC ASSISTED RIGHT SALPINGO OOPHORECTOMY WITH MINI LAPAROTOMY AND LYSIS OF ADHESIONS;  Surgeon: Eloy Herring, MD;  Location: [facility];  Service: Gynecology;  Laterality: Bilateral;     reports that she has never smoked. She has never used smokeless tobacco. She reports that she does not drink alcohol and does not use drugs.  No Known Allergies  Family History  Problem Relation Age of Onset   Cancer Mother  Heart attack Father    Prostate cancer Brother    Lung cancer Brother    Cancer Daughter 34       kidney   Breast cancer Daughter 42   Colon cancer Neg Hx    Colon polyps Neg Hx    Prior to Admission medications   Medication Sig Start Date End Date Taking? Authorizing Provider  acetaminophen  (TYLENOL ) 500 MG tablet Take 500 mg by mouth every 6 (six) hours as needed for moderate pain (pain score 4-6).   Yes [provider]  amitriptyline  (ELAVIL ) 50 MG tablet Take 50 mg by mouth at bedtime.  02/17/19  Yes [provider]  amLODipine -benazepril  (LOTREL) 5-10 MG capsule Take 1 capsule by mouth daily. 05/03/20  Yes Pearlean Manus, MD  aspirin  325 MG tablet Take 1 tablet (325 mg total) by mouth daily with breakfast. 05/03/20  Yes Kymberlie Brazeau, Courage, MD  atorvastatin  (LIPITOR) 20 MG tablet Take 20 mg by mouth daily. 11/25/23  Yes [provider]  furosemide (LASIX) 20 MG tablet Take 20 mg by mouth daily. 11/23/23  Yes [provider]  gabapentin  (NEURONTIN ) 100 MG capsule Take 100-300 mg by mouth 3 (three) times daily. Take 1 capsule (100 mg) by mouth in the morning and midday, then take 3 capsules (300 mg) by mouth in the evening. 12/23/23  Yes [provider]  HUMULIN  N KWIKPEN 100 UNIT/ML KwikPen Inject 20-40 Units into the skin 2 (two) times daily with a meal. Inject 40 units into the skin each morning and 20 units each evening. 12/24/23  Yes [provider]  meclizine  (ANTIVERT ) 25 MG tablet Take 1 tablet (25 mg total) by mouth 3 (three) times daily as needed for dizziness. 08/02/22  Yes Mesner, Selinda, MD  metFORMIN (GLUCOPHAGE) 1000 MG tablet Take 1,000 mg by mouth 2 (two) times daily.  03/09/19  Yes [provider]  metoprolol  tartrate (LOPRESSOR ) 25 MG tablet Take 25 mg by mouth 2 (two) times daily. 06/04/20  Yes [provider]  RESTASIS  0.05 % ophthalmic emulsion Place 1 drop into both eyes 2 (two) times daily.  04/06/20  Yes [provider]  ACCU-CHEK AVIVA PLUS test strip  05/09/20   [provider]  BD INSULIN  SYRINGE U/F 31G X 5/16 1 ML MISC 2 (two) times daily. as directed 03/02/20   [provider]    Physical Exam: Vitals:   01/13/24 1500 01/13/24 1530 01/13/24 1600 01/13/24 1630  BP: 99/77 (!) 102/54 (!) 123/50 (!) 117/57  Pulse: 72 73 84 83  Resp: (!) 22  (!) 28 (!) 23  Temp:    98.1 F (36.7 C)  TempSrc:       SpO2: 95% 92% 97% 95%  Weight:      Height:        Constitutional: NAD, calm, comfortable Vitals:   01/13/24 1500 01/13/24 1530 01/13/24 1600 01/13/24 1630  BP: 99/77 (!) 102/54 (!) 123/50 (!) 117/57  Pulse: 72 73 84 83  Resp: (!) 22  (!) 28 (!) 23  Temp:    98.1 F (36.7 C)  TempSrc:      SpO2: 95% 92% 97% 95%  Weight:      Height:       Eyes: Blind right eye, right eye closed. ENMT: Mucous membranes are dry. Neck: normal, supple, no masses, no thyromegaly Respiratory: clear to auscultation bilaterally, no wheezing, no crackles. Normal respiratory effort. No accessory muscle use.  Cardiovascular: Regular rate and rhythm, 3/6 murmur-previously documented/  no rubs / gallops. No extremity edema.  Extremities warm. Abdomen: no tenderness, firm palpable mass to right and mid abdomen, otherwise abdomen soft musculoskeletal: no clubbing / cyanosis. No joint deformity upper and lower extremities.   Skin: no rashes, lesions, ulcers. No induration Neurologic:  No facial symmetry, moving extremities spontaneously, speech fluent. Psychiatric: Normal judgment and insight. Alert and oriented x 3. Normal mood.   Labs on Admission: I have personally reviewed following labs and imaging studies  CBC: Recent Labs  Lab 01/13/24 1346  WBC 10.6*  NEUTROABS 9.2*  HGB 11.7*  HCT 36.6  MCV 87.4  PLT 268   Basic Metabolic Panel: Recent Labs  Lab 01/13/24 1346  NA 136  K 4.2  CL 109  CO2 18*  GLUCOSE 228*  BUN 15  CREATININE 1.16*  CALCIUM  8.2*   GFR: Estimated Creatinine Clearance: 43.5 mL/min (A) (by C-G formula based on SCr of 1.16 mg/dL (H)). Liver Function Tests: Recent Labs  Lab 01/13/24 1346  AST 22  ALT 16  ALKPHOS 111  BILITOT 1.0  PROT 6.4*  ALBUMIN 3.2*   Coagulation Profile: Recent Labs  Lab 01/13/24 1346  INR 1.2   CBG: Recent Labs  Lab 01/13/24 1215  GLUCAP 239*   Urine analysis:    Component Value Date/Time   COLORURINE YELLOW 01/13/2024  1248   APPEARANCEUR HAZY (A) 01/13/2024 1248   LABSPEC 1.011 01/13/2024 1248   PHURINE 5.0 01/13/2024 1248   GLUCOSEU NEGATIVE 01/13/2024 1248   HGBUR NEGATIVE 01/13/2024 1248   BILIRUBINUR NEGATIVE 01/13/2024 1248   KETONESUR NEGATIVE 01/13/2024 1248   PROTEINUR NEGATIVE 01/13/2024 1248   NITRITE NEGATIVE 01/13/2024 1248   LEUKOCYTESUR NEGATIVE 01/13/2024 1248    Radiological Exams on Admission: CT Angio Chest PE W and/or Wo Contrast Result Date: 01/13/2024 CLINICAL DATA:  Pulmonary embolism (PE) suspected, high prob; Abdominal pain, acute, nonlocalized cancer hx ? - hypotension, abd pain Altered mental status with weakness, low abdominal pain and hypotension. 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 EXAM: CT ANGIOGRAPHY CHEST CT ABDOMEN AND PELVIS WITH CONTRAST TECHNIQUE: Multidetector CT imaging of the chest was performed using the standard protocol during bolus administration of intravenous contrast. Multiplanar CT image reconstructions and MIPs were obtained to evaluate the vascular anatomy. Multidetector CT imaging of the abdomen and pelvis was performed using the standard protocol during bolus administration of intravenous contrast. RADIATION DOSE REDUCTION: This exam was performed according to the departmental dose-optimization program which includes automated exposure control, adjustment of the mA and/or kV according to patient size and/or use of iterative reconstruction technique. CONTRAST:  OMNIPAQUE  IOHEXOL  350 MG/ML SOLN COMPARISON:  Chest radiographs 01/13/2024, abdominopelvic CT 05/03/2020. Pelvic ultrasound 05/02/2020. FINDINGS: CTA CHEST FINDINGS Cardiovascular: The pulmonary arteries are well opacified with contrast to the level of the segmental branches. There is no evidence of acute pulmonary embolism. Atherosclerosis of the aorta, great vessels and coronary arteries. Medially displaced retropharyngeal  carotid arteries bilaterally. No acute systemic arterial abnormalities are identified. The heart is mildly enlarged. There are calcifications of the aortic valve. No significant pericardial fluid. Mediastinum/Nodes: There are no enlarged mediastinal, hilar or axillary lymph nodes. The thyroid gland, trachea and esophagus demonstrate no significant findings. Lungs/Pleura: No pleural effusion or pneumothorax. Bandlike opacity extending superiorly from the right hilum, likely reflecting an area of scarring. Additional more linear scarring at both lung bases. No confluent airspace disease or suspicious pulmonary nodularity. Musculoskeletal/Chest wall: No chest wall mass or suspicious osseous  findings. Multilevel spondylosis, most advanced in the cervical spine. There are scattered Schmorl's nodes, most prominent within the superior endplate of T11. CT ABDOMEN AND PELVIS FINDINGS Hepatobiliary: The liver is normal in density without suspicious focal abnormality. No evidence of gallstones, gallbladder wall thickening or biliary dilatation. Pancreas: Unremarkable. No pancreatic ductal dilatation or surrounding inflammatory changes. Spleen: Normal in size without focal abnormality. Adrenals/Urinary Tract: Both adrenal glands appear normal. Previously demonstrated cystic lesion in the interpolar region of the left kidney is no longer demonstrated. There is no evidence of urinary tract calculus or suspicious focal renal lesion. Mild chronic dilatation of the renal pelves and ureters without significantly delayed contrast excretion or perinephric soft tissue stranding. The bladder appears normal for its degree of distention. Stomach/Bowel: No enteric contrast administered. The stomach appears unremarkable for its degree of distention. There is no small bowel distension, wall thickening or surrounding inflammation. The appendix is not as well seen currently, but there is no pericecal inflammation to suggest appendicitis. The  colon is fluid-filled without significant distension. There is possible mild wall thickening at the splenic flexure with mild surrounding soft tissue stranding. The distal colon appears unremarkable. Vascular/Lymphatic: There are no enlarged abdominal or pelvic lymph nodes. Aortic and branch vessel atherosclerosis without evidence of aneurysm or large vessel occlusion. The renal veins, IVC, portal and superior mesenteric veins are patent. Reproductive: By history, the previously demonstrated large complex cystic mass in the right pelvis was surgically resected in the interval. There is a recurrent similar appearing mass, currently measures 13.9 x 7.8 cm on axial image 71/4 and 8.0 cm on coronal image 79/6 (previous measurements approximately 16.0 x 12.7 x 11.4 cm). This lesion remains largely cystic with a few mildly thickened septations. No left adnexal mass. Previous hysterectomy. Other: Intact abdominal wall. Trace ascites without focal extraluminal fluid collection or pneumoperitoneum. As above, possible soft tissue stranding around the splenic flexure of the colon with possible omental edema. No discrete peritoneal nodularity identified. Musculoskeletal: No acute or significant osseous findings. Chronic Schmorl's node in the superior endplate of L1. Review of the MIP images confirms the above findings. IMPRESSION: 1. No evidence of acute pulmonary embolism or other acute vascular findings in the chest. 2. 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 Associated ascites and possible omental edema concerning for peritoneal spread of tumor. 3. Fluid-filled colon without significant distension, suggesting diarrheal illness. Possible mild wall thickening and surrounding soft tissue stranding at the splenic flexure of the colon, suspicious for colitis versus peritoneal disease (as above). 4.  Aortic Atherosclerosis (ICD10-I70.0).  Electronically Signed   By: Elsie Perone M.D.   On: 01/13/2024 16:53   CT ABDOMEN PELVIS W CONTRAST Result Date: 01/13/2024 CLINICAL DATA:  Pulmonary embolism (PE) suspected, high prob; Abdominal pain, acute, nonlocalized cancer hx ? - hypotension, abd pain Altered mental status with weakness, low abdominal pain and hypotension. 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 EXAM: CT ANGIOGRAPHY CHEST CT ABDOMEN AND PELVIS WITH CONTRAST TECHNIQUE: Multidetector CT imaging of the chest was performed using the standard protocol during bolus administration of intravenous contrast. Multiplanar CT image reconstructions and MIPs were obtained to evaluate the vascular anatomy. Multidetector CT imaging of the abdomen and pelvis was performed using the standard protocol during bolus administration of intravenous contrast. RADIATION DOSE REDUCTION: This exam was performed according to the departmental dose-optimization program which includes automated exposure control, adjustment of the mA and/or kV  according to patient size and/or use of iterative reconstruction technique. CONTRAST:  OMNIPAQUE  IOHEXOL  350 MG/ML SOLN COMPARISON:  Chest radiographs 01/13/2024, abdominopelvic CT 05/03/2020. Pelvic ultrasound 05/02/2020. FINDINGS: CTA CHEST FINDINGS Cardiovascular: The pulmonary arteries are well opacified with contrast to the level of the segmental branches. There is no evidence of acute pulmonary embolism. Atherosclerosis of the aorta, great vessels and coronary arteries. Medially displaced retropharyngeal carotid arteries bilaterally. No acute systemic arterial abnormalities are identified. The heart is mildly enlarged. There are calcifications of the aortic valve. No significant pericardial fluid. Mediastinum/Nodes: There are no enlarged mediastinal, hilar or axillary lymph nodes. The thyroid gland, trachea and esophagus demonstrate no significant  findings. Lungs/Pleura: No pleural effusion or pneumothorax. Bandlike opacity extending superiorly from the right hilum, likely reflecting an area of scarring. Additional more linear scarring at both lung bases. No confluent airspace disease or suspicious pulmonary nodularity. Musculoskeletal/Chest wall: No chest wall mass or suspicious osseous findings. Multilevel spondylosis, most advanced in the cervical spine. There are scattered Schmorl's nodes, most prominent within the superior endplate of T11. CT ABDOMEN AND PELVIS FINDINGS Hepatobiliary: The liver is normal in density without suspicious focal abnormality. No evidence of gallstones, gallbladder wall thickening or biliary dilatation. Pancreas: Unremarkable. No pancreatic ductal dilatation or surrounding inflammatory changes. Spleen: Normal in size without focal abnormality. Adrenals/Urinary Tract: Both adrenal glands appear normal. Previously demonstrated cystic lesion in the interpolar region of the left kidney is no longer demonstrated. There is no evidence of urinary tract calculus or suspicious focal renal lesion. Mild chronic dilatation of the renal pelves and ureters without significantly delayed contrast excretion or perinephric soft tissue stranding. The bladder appears normal for its degree of distention. Stomach/Bowel: No enteric contrast administered. The stomach appears unremarkable for its degree of distention. There is no small bowel distension, wall thickening or surrounding inflammation. The appendix is not as well seen currently, but there is no pericecal inflammation to suggest appendicitis. The colon is fluid-filled without significant distension. There is possible mild wall thickening at the splenic flexure with mild surrounding soft tissue stranding. The distal colon appears unremarkable. Vascular/Lymphatic: There are no enlarged abdominal or pelvic lymph nodes. Aortic and branch vessel atherosclerosis without evidence of aneurysm or  large vessel occlusion. The renal veins, IVC, portal and superior mesenteric veins are patent. Reproductive: By history, the previously demonstrated large complex cystic mass in the right pelvis was surgically resected in the interval. There is a recurrent similar appearing mass, currently measures 13.9 x 7.8 cm on axial image 71/4 and 8.0 cm on coronal image 79/6 (previous measurements approximately 16.0 x 12.7 x 11.4 cm). This lesion remains largely cystic with a few mildly thickened septations. No left adnexal mass. Previous hysterectomy. Other: Intact abdominal wall. Trace ascites without focal extraluminal fluid collection or pneumoperitoneum. As above, possible soft tissue stranding around the splenic flexure of the colon with possible omental edema. No discrete peritoneal nodularity identified. Musculoskeletal: No acute or significant osseous findings. Chronic Schmorl's node in the superior endplate of L1. Review of the MIP images confirms the above findings. IMPRESSION: 1. No evidence of acute pulmonary embolism or other acute vascular findings in the chest. 2. 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 Associated ascites and possible omental edema concerning for peritoneal spread of tumor. 3. Fluid-filled colon without significant distension, suggesting diarrheal illness. Possible mild wall thickening and surrounding soft tissue stranding at the splenic flexure of the colon, suspicious for colitis versus peritoneal  disease (as above). 4.  Aortic Atherosclerosis (ICD10-I70.0). Electronically Signed   By: Elsie Perone M.D.   On: 01/13/2024 16:53   DG Chest Port 1 View Result Date: 01/13/2024 CLINICAL DATA:  Suspected sepsis. EXAM: PORTABLE CHEST 1 VIEW COMPARISON:  Chest radiograph dated 04/11/2022. FINDINGS: There is mild cardiomegaly with mild vascular congestion. No focal consolidation, pleural effusion,  pneumothorax. A loop recorder device noted. No acute osseous pathology. IMPRESSION: Mild cardiomegaly with mild vascular congestion. No focal consolidation. Electronically Signed   By: Vanetta Chou M.D.   On: 01/13/2024 13:20   EKG: Independently reviewed.  Sinus rhythm, rate 58, QTc 426.  No significant change from prior.  Assessment/Plan Principal Problem:   Hypotension Active Problems:   Colitis   History of stroke   Essential hypertension   Diabetic polyneuropathy (HCC)   Blind right eye   Pelvic mass in female-large septated mass in the pelvis measuring up 13.7 CM-?? Ovarian Origin   Cardiac murmur  Assessment and Plan: * Hypotension Blood pressure on arrival 71/45 improved with fluid bolus.  Lactic acidosis likely from hypotension- 3.3> 3.5> 2.8. Likely combination of poor oral intake, colitis, antihypertensives-amlodipine  benazepril  and metoprolol  and diuretic Lasix. - Continue hydration  Colitis Presenting with lower abdominal pain.  Initial constipation appears to have resolved, had 3 bowel movements in Ed, the last 2 were watery.  Afebrile without leukocytosis.  CT -[facility]-colitis versus peritoneal disease, fluid-filled colon suggesting diarrheal illness. -Stool C. difficile - GI pathogen panel - 2 L bolus given, cont N/s 75cc/hr -Bowel rest with clear liquid diet -Zofran  as needed - IV Ceftriaxone  and metronidazole   Pelvic mass in female-large septated mass in the pelvis measuring up 13.7 CM-?? Ovarian Origin Underwent - robotic assisted right salpingo-oophorectomy on July 05, 2020 by Dr. Eloy.  She had a cyst at that time that was unavoidably ruptured during surgery due to dense adhesions.  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 -CT abdomen and pelvis today shows recurrent cystic pelvic mass-consistent with tumor recurrence.  Associated ascites and possible omental edema concerning for peritoneal spread of  tumor. -Follow-up as outpatient  Diabetic polyneuropathy (HCC) - HgbA1c - Hold home insulin  - SSI- M   Essential hypertension Presenting with hypotension. 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   DVT prophylaxis: Lovenox  Code Status: FULL Family Communication: Daughter- Jean Watts at bedside Disposition Plan: ~ 2 days Consults called: None  Admission status: Inpt tele I certify that at the point of admission it is my clinical judgment that the patient will require inpatient hospital care spanning beyond 2 midnights from the point of admission due to high intensity of service, high risk for further deterioration and high frequency of surveillance required.   Author: Tully FORBES Carwin, MD 01/13/2024 9:55 PM  For on call review [email or web address].

## 2024-01-13 NOTE — ED Triage Notes (Signed)
 Pt bib [facility]. Per pts daughter pt was not acting her self this morning. Pt is weak & c/o of lower abd pain. EMS found pt to be hypotensive. Pts bp in triage is 69/43. Edp at bedside.

## 2024-01-13 NOTE — Sepsis Progress Note (Signed)
 Notified provider and bedside nurse of need to order and administer fluid bolus, pt needs 2970 cc fluid.

## 2024-01-13 NOTE — Progress Notes (Signed)
 PHARMACIST - PHYSICIAN COMMUNICATION  CONCERNING:  Enoxaparin  (Lovenox ) for DVT Prophylaxis   RECOMMENDATION: Patient was prescribed enoxaparin  40mg  q24 hours for VTE prophylaxis.   Filed Weights   01/13/24 1216  Weight: 99 kg (218 lb 4.1 oz)   Body mass index is 37.46 kg/m.  Estimated Creatinine Clearance: 43.5 mL/min (A) (by C-G formula based on SCr of 1.16 mg/dL (H)).  Based on [facility] policy patient is candidate for enoxaparin  0.5mg /kg TBW SQ every 24 hours based on BMI being >30.  DESCRIPTION: Pharmacy has adjusted enoxaparin  dose per [facility] policy.  Patient is now receiving enoxaparin  50 mg every 24 hours.  Ananth Fiallos, PharmD Clinical Pharmacist  01/13/2024 10:00 PM

## 2024-01-13 NOTE — Plan of Care (Signed)
   Problem: Activity: Goal: Risk for activity intolerance will decrease Outcome: Progressing   Problem: Coping: Goal: Level of anxiety will decrease Outcome: Progressing

## 2024-01-13 NOTE — Assessment & Plan Note (Signed)
-   HgbA1c - Hold home insulin  - SSI- M

## 2024-01-13 NOTE — ED Provider Notes (Signed)
 [facility] AT [facility] Provider Note   CSN: [id number] Arrival date & time: 01/13/24  1210     Patient presents with: Hypotension   Jean Watts is a 83 y.o. female.   HPI     83 year old patient comes in with chief complaint of low blood pressure, altered mental status. Patient was last seen normal last night when she went to bed around 9 PM.  This morning, family found patient weak, complaining of abdominal pain.  Patient was found hypotensive.  According to EMS, blood pressure was 70s over 40s.  Patient has past medical history of stroke, diabetes and pelvic mass.  Patient reports that she is having abdominal pain and feeling weak, dizzy.  Patient is AO x 3.  She denies any headache, chest pain, UTI-like symptoms, cough, fevers, chills, bloody stools.  She denies any history of PE.   Prior to Admission medications   Medication Sig Start Date End Date Taking? Authorizing Provider  ACCU-CHEK AVIVA PLUS test strip  05/09/20   [provider]  acetaminophen  (TYLENOL ) 500 MG tablet Take 500 mg by mouth every 6 (six) hours as needed for moderate pain.    [provider]  albuterol  (PROVENTIL ) (2.5 MG/3ML) 0.083% nebulizer solution Inhale 2.5 mg into the lungs every 6 (six) hours as needed for wheezing or shortness of breath.  01/29/19   [provider]  amitriptyline  (ELAVIL ) 50 MG tablet Take 50 mg by mouth at bedtime.  02/17/19   [provider]  amLODipine -benazepril  (LOTREL) 5-10 MG capsule Take 1 capsule by mouth daily. 05/03/20   Pearlean Manus, MD  aspirin  325 MG tablet Take 1 tablet (325 mg total) by mouth daily with breakfast. 05/03/20   Emokpae, Courage, MD  atorvastatin  (LIPITOR) 20 MG tablet Take 20 mg by mouth daily. 11/25/23   [provider]  BD INSULIN  SYRINGE U/F 31G X 5/16 1 ML MISC 2 (two) times daily. as directed 03/02/20   [provider]  furosemide (LASIX) 20 MG tablet Take  20 mg by mouth daily as needed. 11/23/23   [provider]  gabapentin  (NEURONTIN ) 100 MG capsule Take 100-200 mg by mouth 3 (three) times daily. 12/23/23   [provider]  HUMULIN  N KWIKPEN 100 UNIT/ML KwikPen Inject 20-40 Units into the skin 2 (two) times daily with a meal. Inject 40 units into the skin each morning and 20 units each evening. 12/24/23   [provider]  meclizine  (ANTIVERT ) 25 MG tablet Take 1 tablet (25 mg total) by mouth 3 (three) times daily as needed for dizziness. 08/02/22   Mesner, Jason, MD  metFORMIN (GLUCOPHAGE) 1000 MG tablet Take 1,000 mg by mouth 2 (two) times daily.  03/09/19   [provider]  metoprolol  tartrate (LOPRESSOR ) 25 MG tablet Take 25 mg by mouth 2 (two) times daily. 06/04/20   [provider]  RESTASIS  0.05 % ophthalmic emulsion Place 1 drop into both eyes 2 (two) times daily.  04/06/20   [provider]    Allergies: Patient has no known allergies.    Review of Systems  All other systems reviewed and are negative.   Updated Vital Signs BP 99/77   Pulse 72   Temp 98 F (36.7 C) (Oral)   Resp (!) 22   Ht 5' 4 (1.626 m)   Wt 99 kg   SpO2 95%   BMI 37.46 kg/m   Physical Exam Vitals and nursing note reviewed.  Constitutional:  Appearance: She is well-developed.  HENT:     Head: Atraumatic.  Eyes:     Extraocular Movements: Extraocular movements intact.     Pupils: Pupils are equal, round, and reactive to light.  Cardiovascular:     Rate and Rhythm: Normal rate.  Pulmonary:     Effort: Pulmonary effort is normal.  Abdominal:     Palpations: Abdomen is soft.     Tenderness: There is abdominal tenderness. There is no guarding or rebound.  Musculoskeletal:     Cervical back: Normal range of motion and neck supple.  Skin:    General: Skin is warm and dry.  Neurological:     Mental Status: She is alert and oriented to person, place, and time.     (all labs ordered are listed,  but only abnormal results are displayed) Labs Reviewed  URINALYSIS, W/ REFLEX TO CULTURE (INFECTION SUSPECTED) - Abnormal; Notable for the following components:      Result Value   APPearance HAZY (*)    Bacteria, UA RARE (*)    All other components within normal limits  CBC WITH DIFFERENTIAL/PLATELET - Abnormal; Notable for the following components:   WBC 10.6 (*)    Hemoglobin 11.7 (*)    Neutro Abs 9.2 (*)    All other components within normal limits  COMPREHENSIVE METABOLIC PANEL WITH GFR - Abnormal; Notable for the following components:   CO2 18 (*)    Glucose, Bld 228 (*)    Creatinine, Ser 1.16 (*)    Calcium  8.2 (*)    Total Protein 6.4 (*)    Albumin 3.2 (*)    GFR, Estimated 47 (*)    All other components within normal limits  LACTIC ACID, PLASMA - Abnormal; Notable for the following components:   Lactic Acid, Venous 3.3 (*)    All other components within normal limits  PROTIME-INR - Abnormal; Notable for the following components:   Prothrombin Time 15.8 (*)    All other components within normal limits  CBG MONITORING, ED - Abnormal; Notable for the following components:   Glucose-Capillary 239 (*)    All other components within normal limits  RESP PANEL BY RT-PCR (RSV, FLU A&B, COVID)  RVPGX2  CULTURE, BLOOD (ROUTINE X 2)  CULTURE, BLOOD (ROUTINE X 2)  LACTIC ACID, PLASMA  CBC WITH DIFFERENTIAL/PLATELET  TYPE AND SCREEN    EKG: EKG Interpretation Date/Time:  Monday January 13 2024 12:20:42 EDT Ventricular Rate:  58 PR Interval:  190 QRS Duration:  97 QT Interval:  433 QTC Calculation: 426 R Axis:   -29  Text Interpretation: Sinus rhythm Borderline left axis deviation Probable anterior infarct, age indeterminate No acute changes No significant change since last tracing Confirmed by Charlyn Sora ([id number]) on 01/13/2024 3:43:29 PM  Radiology: [facility] Chest Port 1 View Result Date: 01/13/2024 CLINICAL DATA:  Suspected sepsis. EXAM: PORTABLE CHEST 1 VIEW COMPARISON:   Chest radiograph dated 04/11/2022. FINDINGS: There is mild cardiomegaly with mild vascular congestion. No focal consolidation, pleural effusion, pneumothorax. A loop recorder device noted. No acute osseous pathology. IMPRESSION: Mild cardiomegaly with mild vascular congestion. No focal consolidation. Electronically Signed   By: Vanetta Chou M.D.   On: 01/13/2024 13:20     .Critical Care  Performed by: Charlyn Sora, MD Authorized by: Charlyn Sora, MD   Critical care provider statement:    Critical care time (minutes):  38   Critical care was necessary to treat or prevent imminent or life-threatening deterioration of the following conditions:  Circulatory failure   Critical care was time spent personally by me on the following activities:  Development of treatment plan with patient or surrogate, discussions with consultants, evaluation of patient's response to treatment, examination of patient, ordering and review of laboratory studies, ordering and review of radiographic studies, ordering and performing treatments and interventions, pulse oximetry, re-evaluation of patient's condition, review of old charts and obtaining history from patient or surrogate    Medications Ordered in the ED  lactated ringers  infusion ( Intravenous New Bag/Given 01/13/24 1550)  metroNIDAZOLE  (FLAGYL ) IVPB 500 mg (has no administration in time range)  vancomycin  (VANCOREADY) IVPB 2000 mg/400 mL (has no administration in time range)  sodium chloride  0.9 % bolus 2,000 mL (2,000 mLs Intravenous Bolus 01/13/24 1215)  ceFEPIme  (MAXIPIME ) 2 g in sodium chloride  0.9 % 100 mL IVPB (2 g Intravenous New Bag/Given 01/13/24 1551)  iohexol  (OMNIPAQUE ) 350 MG/ML injection 100 mL (100 mLs Intravenous Contrast Given 01/13/24 1611)    Clinical Course as of 01/13/24 1621  Mon Jan 13, 2024  1557 Recevied sign out from Dr. Charlyn pending CT scans. Presented with weakness, altered, hypotensive. Patient has received fluids.  Pending repeat lactic. Will need admission. Patient has no complaints beside belly pain [WS]  1615 Sepsis hemodynamic reassessment completed.  Patient has received final 500 cc per EMS.  We ordered 2000 more mL in the ER.   CT scans have been ordered at this time.  Her care has been signed out to incoming team.  Patient's blood pressure has responded to the IV fluids.  Anticipate admission after CT scans. [AN]    Clinical Course User Index [AN] Charlyn Sora, MD [WS] Francesca Elsie CROME, MD                                 Medical Decision Making Amount and/or Complexity of Data Reviewed Labs: ordered. Radiology: ordered.  Risk Prescription drug management.   83 year old patient comes in with chief complaint of low blood pressure, altered mental status.  Patient was seen immediately upon arrival.  She has no focal neurodeficits, is AO x 3 during my assessment.  Differential diagnosis for hypotension includes questionable hypovolemia, internal hemorrhage, PE, evolving sepsis.  She has complained of some abdominal pain, therefore mesenteric ischemia along with intra-abdominal infection have been considered.  I performed a bedside fast, and she did not have any free fluid.  Plan is to get basic labs, give patient 2000 more cc of fluid after the 500 cc started by EMS is completed.  It appears that patient was completely fine yesterday, and then had a prompt decline today.  Will try to get additional information from family.  Reassessment: I was unable to get in touch with family, however I was informed that patient's family was at the bedside, but now they have left.  CT PE and CT abdomen pelvis with contrast has been ordered now.  Her lab workup is overall reassuring besides elevated lactic acid.  Blood pressure has responded to the IV fluid.  Patient will need admission to the hospital. Working differential includes mesenteric ischemia, evolving sepsis, internal bleeding and  PE.  Final diagnoses:  Shock circulatory [facility])    ED Discharge Orders     None          Charlyn Sora, MD 01/13/24 [id number]

## 2024-01-13 NOTE — Sepsis Progress Note (Signed)
 Elink monitoring for the code sepsis protocol.

## 2024-01-13 NOTE — Assessment & Plan Note (Signed)
 Underwent - robotic assisted right salpingo-oophorectomy on July 05, 2020 by Dr. Eloy.  She had a cyst at that time that was unavoidably ruptured during surgery due to dense adhesions.  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 -CT abdomen and pelvis today shows recurrent cystic pelvic mass-consistent with tumor recurrence.  Associated ascites and possible omental edema concerning for peritoneal spread of tumor. -Follow-up as outpatient

## 2024-01-13 NOTE — Assessment & Plan Note (Signed)
 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

## 2024-01-13 NOTE — ED Notes (Signed)
 ED TO INPATIENT HANDOFF REPORT  ED Nurse Name and Phone #: Sherlean Medic   S Name/Age/Gender Jean Watts 83 y.o. female Room/Bed: APA08/APA08  Code Status   Code Status: Prior  Home/SNF/Other Home Patient oriented to: self, place, time, and situation Is this baseline? Yes   Triage Complete: Triage complete  Chief Complaint Hypotension [I95.9]  Triage Note Pt bib rcems. Per pts daughter pt was not acting her self this morning. Pt is weak & c/o of lower abd pain. EMS found pt to be hypotensive. Pts bp in triage is 69/43. Edp at bedside.    Allergies No Known Allergies  Level of Care/Admitting Diagnosis ED Disposition     ED Disposition  Admit   Condition  --   Comment  Hospital Area: [facility] [[id number]]  Level of Care: Telemetry [5]  Covid Evaluation: Asymptomatic - no recent exposure (last 10 days) testing not required  Diagnosis: Hypotension [255160]  Admitting Physician: EMOKPAE, EJIROGHENE E [[id number]  Attending Physician: EMOKPAE, EJIROGHENE E [phone]  For patients discharging to extended facilities (i.e. SNF, AL, group homes or LTAC) initiate:: Discharge to SNF/Facility Placement COVID-19 Lab Testing Protocol          B Medical/Surgery History Past Medical History:  Diagnosis Date   Arthritis    Dyspnea    Headache    History of dizziness    HLD (hyperlipidemia)    HTN (hypertension)    Neuropathy    Stroke (HCC)    no residual    Type 2 diabetes mellitus with diabetic polyneuropathy (HCC)    Visual loss    right eye   Past Surgical History:  Procedure Laterality Date   ABDOMINAL HYSTERECTOMY     BREAST BIOPSY  02/2019   BREAST LUMPECTOMY WITH RADIOACTIVE SEED LOCALIZATION Right 12/22/2019   Procedure: RIGHT BREAST LUMPECTOMY X 2 WITH RADIOACTIVE SEED LOCALIZATION;  Surgeon: Vernetta Berg, MD;  Location: [facility];  Service: General;  Laterality: Right;   FLEXIBLE SIGMOIDOSCOPY N/A 06/04/2019   Procedure:  FLEXIBLE SIGMOIDOSCOPY;  Surgeon: Shaaron Lamar HERO, MD;  Location[facility];  Service: Endoscopy;  Laterality: N/A;  incomplete colonoscopy, poor prep   IR CT HEAD LTD  08/04/2019   IR PERCUTANEOUS ART THROMBECTOMY/INFUSION INTRACRANIAL INC DIAG ANGIO  08/04/2019   LOOP RECORDER INSERTION N/A 08/06/2019   Procedure: LOOP RECORDER INSERTION;  Surgeon: Kelsie Agent, MD;  Location: [facility];  Service: Cardiovascular;  Laterality: N/A;   RADIOLOGY WITH ANESTHESIA N/A 08/04/2019   Procedure: IR WITH ANESTHESIA;  Surgeon: Radiologist, Medication, MD;  Location: [facility] OR;  Service: Radiology;  Laterality: N/A;   ROBOTIC ASSISTED BILATERAL SALPINGO OOPHERECTOMY Bilateral 07/05/2020   Procedure: XI ROBOTIC ASSISTED RIGHT SALPINGO OOPHORECTOMY WITH MINI LAPAROTOMY AND LYSIS OF ADHESIONS;  Surgeon: Eloy Herring, MD;  Location: [facility];  Service: Gynecology;  Laterality: Bilateral;     A IV Location/Drains/Wounds Patient Lines/Drains/Airways Status     Active Line/Drains/Airways     Name Placement date Placement time Site Days   Peripheral IV 01/13/24 20 G 1 Anterior;Left Wrist 01/13/24  --  Wrist  less than 1   Peripheral IV 01/13/24 20 G Anterior;Left;Proximal Forearm 01/13/24  1215  Forearm  less than 1   Peripheral IV 01/13/24 20 G 1 Anterior;Right Forearm 01/13/24  1500  Forearm  less than 1   Incision - 5 Ports Abdomen 1: Right;Lateral;Lower 2: Right;Lateral;Upper 3: Left;Lateral;Upper 4: Left;Lateral;Mid 5: Lower;Left;Lateral 07/05/20  1015  -- 1287  Intake/Output Last 24 hours  Intake/Output Summary (Last 24 hours) at 01/13/2024 2044 Last data filed at 01/13/2024 1948 Gross per 24 hour  Intake 900 ml  Output 600 ml  Net 300 ml    Labs/Imaging Results for orders placed or performed during the hospital encounter of 01/13/24 (from the past 48 hours)  CBG monitoring, ED     Status: Abnormal   Collection Time: 01/13/24 12:15 PM  Result Value Ref Range    Glucose-Capillary 239 (H) 70 - 99 mg/dL    Comment: Glucose reference range applies only to samples taken after fasting for at least 8 hours.  Urinalysis, w/ Reflex to Culture (Infection Suspected) -Urine, Clean Catch     Status: Abnormal   Collection Time: 01/13/24 12:48 PM  Result Value Ref Range   Specimen Source URINE, CATHETERIZED    Color, Urine YELLOW YELLOW   APPearance HAZY (A) CLEAR   Specific Gravity, Urine 1.011 1.005 - 1.030   pH 5.0 5.0 - 8.0   Glucose, UA NEGATIVE NEGATIVE mg/dL   Hgb urine dipstick NEGATIVE NEGATIVE   Bilirubin Urine NEGATIVE NEGATIVE   Ketones, ur NEGATIVE NEGATIVE mg/dL   Protein, ur NEGATIVE NEGATIVE mg/dL   Nitrite NEGATIVE NEGATIVE   Leukocytes,Ua NEGATIVE NEGATIVE   RBC / HPF 0-5 0 - 5 RBC/hpf   WBC, UA 0-5 0 - 5 WBC/hpf    Comment:        Reflex urine culture not performed if WBC <=10, OR if Squamous epithelial cells >5. If Squamous epithelial cells >5 suggest recollection.    Bacteria, UA RARE (A) NONE SEEN   Squamous Epithelial / HPF 0-5 0 - 5 /HPF    Comment: Performed at [facility], [address], [place], [place] [address]  Resp panel by RT-PCR (RSV, Flu A&B, Covid) Anterior Nasal Swab     Status: None   Collection Time: 01/13/24 12:48 PM   Specimen: Anterior Nasal Swab  Result Value Ref Range   SARS Coronavirus 2 by RT PCR NEGATIVE NEGATIVE    Comment: (NOTE) SARS-CoV-2 target nucleic acids are NOT DETECTED.  The SARS-CoV-2 RNA is generally detectable in upper respiratory specimens during the acute phase of infection. The lowest concentration of SARS-CoV-2 viral copies this assay can detect is 138 copies/mL. A negative result does not preclude SARS-Cov-2 infection and should not be used as the sole basis for treatment or other patient management decisions. A negative result may occur with  improper specimen collection/handling, submission of specimen other than nasopharyngeal swab, presence of viral mutation(s) within  the areas targeted by this assay, and inadequate number of viral copies(<138 copies/mL). A negative result must be combined with clinical observations, patient history, and epidemiological information. The expected result is Negative.  Fact Sheet for Patients:  BloggerCourse.com  Fact Sheet for Healthcare Providers:  SeriousBroker.it  This test is no t yet approved or cleared by the [place]  FDA and  has been authorized for detection and/or diagnosis of SARS-CoV-2 by FDA under an Emergency Use Authorization (EUA). This EUA will remain  in effect (meaning this test can be used) for the duration of the COVID-19 declaration under Section 564(b)(1) of the Act, 21 U.S.C.section 360bbb-3(b)(1), unless the authorization is terminated  or revoked sooner.       Influenza A by PCR NEGATIVE NEGATIVE   Influenza B by PCR NEGATIVE NEGATIVE    Comment: (NOTE) The Xpert Xpress SARS-CoV-2/FLU/RSV plus assay is intended as an aid in the diagnosis of influenza from Nasopharyngeal swab  specimens and should not be used as a sole basis for treatment. Nasal washings and aspirates are unacceptable for Xpert Xpress SARS-CoV-2/FLU/RSV testing.  Fact Sheet for Patients: BloggerCourse.com  Fact Sheet for Healthcare Providers: SeriousBroker.it  This test is not yet approved or cleared by the [place]  FDA and has been authorized for detection and/or diagnosis of SARS-CoV-2 by FDA under an Emergency Use Authorization (EUA). This EUA will remain in effect (meaning this test can be used) for the duration of the COVID-19 declaration under Section 564(b)(1) of the Act, 21 U.S.C. section 360bbb-3(b)(1), unless the authorization is terminated or revoked.     Resp Syncytial Virus by PCR NEGATIVE NEGATIVE    Comment: (NOTE) Fact Sheet for Patients: BloggerCourse.com  Fact  Sheet for Healthcare Providers: SeriousBroker.it  This test is not yet approved or cleared by the [place]  FDA and has been authorized for detection and/or diagnosis of SARS-CoV-2 by FDA under an Emergency Use Authorization (EUA). This EUA will remain in effect (meaning this test can be used) for the duration of the COVID-19 declaration under Section 564(b)(1) of the Act, 21 U.S.C. section 360bbb-3(b)(1), unless the authorization is terminated or revoked.  Performed at [facility], [address], [place], [place] [address]   Type and screen [facility]     Status: None   Collection Time: 01/13/24 12:50 PM  Result Value Ref Range   ABO/RH(D) A POS    Antibody Screen NEG    Sample Expiration      01/16/2024,2359 Performed at [facility], [address], [place], [place] [address]   CBC with Differential/Platelet     Status: Abnormal   Collection Time: 01/13/24  1:46 PM  Result Value Ref Range   WBC 10.6 (H) 4.0 - 10.5 K/uL   RBC 4.19 3.87 - 5.11 MIL/uL   Hemoglobin 11.7 (L) 12.0 - 15.0 g/dL   HCT 63.3 63.9 - 53.9 %   MCV 87.4 80.0 - 100.0 fL   MCH 27.9 26.0 - 34.0 pg   MCHC 32.0 30.0 - 36.0 g/dL   RDW 85.1 88.4 - 84.4 %   Platelets 268 150 - 400 K/uL   nRBC 0.0 0.0 - 0.2 %   Neutrophils Relative % 87 %   Neutro Abs 9.2 (H) 1.7 - 7.7 K/uL   Lymphocytes Relative 10 %   Lymphs Abs 1.1 0.7 - 4.0 K/uL   Monocytes Relative 3 %   Monocytes Absolute 0.3 0.1 - 1.0 K/uL   Eosinophils Relative 0 %   Eosinophils Absolute 0.0 0.0 - 0.5 K/uL   Basophils Relative 0 %   Basophils Absolute 0.0 0.0 - 0.1 K/uL   Immature Granulocytes 0 %   Abs Immature Granulocytes 0.03 0.00 - 0.07 K/uL    Comment: Performed at [facility], [address], [place], [place] [address]  Comprehensive metabolic panel with GFR     Status: Abnormal   Collection Time: 01/13/24  1:46 PM  Result Value Ref Range   Sodium 136 135 - 145 mmol/L   Potassium 4.2 3.5 - 5.1 mmol/L    Chloride 109 98 - 111 mmol/L   CO2 18 (L) 22 - 32 mmol/L   Glucose, Bld 228 (H) 70 - 99 mg/dL    Comment: Glucose reference range applies only to samples taken after fasting for at least 8 hours.   BUN 15 8 - 23 mg/dL   Creatinine, Ser 8.83 (H) 0.44 - 1.00 mg/dL   Calcium  8.2 (L) 8.9 - 10.3  mg/dL   Total Protein 6.4 (L) 6.5 - 8.1 g/dL   Albumin 3.2 (L) 3.5 - 5.0 g/dL   AST 22 15 - 41 U/L   ALT 16 0 - 44 U/L   Alkaline Phosphatase 111 38 - 126 U/L   Total Bilirubin 1.0 0.0 - 1.2 mg/dL   GFR, Estimated 47 (L) >60 mL/min    Comment: (NOTE) Calculated using the CKD-EPI Creatinine Equation (2021)    Anion gap 9 5 - 15    Comment: Performed at [facility], [address], [place], [place] [address]  Lactic acid, plasma     Status: Abnormal   Collection Time: 01/13/24  1:46 PM  Result Value Ref Range   Lactic Acid, Venous 3.3 (HH) 0.5 - 1.9 mmol/L    Comment: CRITICAL RESULT CALLED TO, READ BACK BY AND VERIFIED WITH MERRICKS,L ON 01/13/24 AT 1528 BY LOY,C Performed at [facility], [address], [place], [place] [address]   Protime-INR     Status: Abnormal   Collection Time: 01/13/24  1:46 PM  Result Value Ref Range   Prothrombin Time 15.8 (H) 11.4 - 15.2 seconds   INR 1.2 0.8 - 1.2    Comment: (NOTE) INR goal varies based on device and disease states. Performed at [facility], [address], [place], [place] [address]   Lactic acid, plasma     Status: Abnormal   Collection Time: 01/13/24  3:45 PM  Result Value Ref Range   Lactic Acid, Venous 3.5 (HH) 0.5 - 1.9 mmol/L    Comment: CRITICAL VALUE NOTED.  VALUE IS CONSISTENT WITH PREVIOUSLY REPORTED AND CALLED VALUE. Performed at [facility], [address], [place], [place] [address]   Lactic acid, plasma     Status: Abnormal   Collection Time: 01/13/24  6:25 PM  Result Value Ref Range   Lactic Acid, Venous 2.8 (HH) 0.5 - 1.9 mmol/L    Comment: CRITICAL VALUE NOTED.  VALUE IS CONSISTENT WITH PREVIOUSLY REPORTED AND CALLED  VALUE. Performed at [facility], [address] [place], [place] [address]    CT Angio Chest PE W and/or Wo Contrast Result Date: 01/13/2024 CLINICAL DATA:  Pulmonary embolism (PE) suspected, high prob; Abdominal pain, acute, nonlocalized cancer hx ? - hypotension, abd pain Altered mental status with weakness, low abdominal pain and hypotension. 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 EXAM: CT ANGIOGRAPHY CHEST CT ABDOMEN AND PELVIS WITH CONTRAST TECHNIQUE: Multidetector CT imaging of the chest was performed using the standard protocol during bolus administration of intravenous contrast. Multiplanar CT image reconstructions and MIPs were obtained to evaluate the vascular anatomy. Multidetector CT imaging of the abdomen and pelvis was performed using the standard protocol during bolus administration of intravenous contrast. RADIATION DOSE REDUCTION: This exam was performed according to the departmental dose-optimization program which includes automated exposure control, adjustment of the mA and/or kV according to patient size and/or use of iterative reconstruction technique. CONTRAST:  OMNIPAQUE  IOHEXOL  350 MG/ML SOLN COMPARISON:  Chest radiographs 01/13/2024, abdominopelvic CT 05/03/2020. Pelvic ultrasound 05/02/2020. FINDINGS: CTA CHEST FINDINGS Cardiovascular: The pulmonary arteries are well opacified with contrast to the level of the segmental branches. There is no evidence of acute pulmonary embolism. Atherosclerosis of the aorta, great vessels and coronary arteries. Medially displaced retropharyngeal carotid arteries bilaterally. No acute systemic arterial abnormalities are identified. The heart is mildly enlarged. There are calcifications of the aortic valve. No significant pericardial fluid. Mediastinum/Nodes: There are no enlarged mediastinal, hilar  or axillary lymph nodes. The thyroid gland, trachea and esophagus demonstrate  no significant findings. Lungs/Pleura: No pleural effusion or pneumothorax. Bandlike opacity extending superiorly from the right hilum, likely reflecting an area of scarring. Additional more linear scarring at both lung bases. No confluent airspace disease or suspicious pulmonary nodularity. Musculoskeletal/Chest wall: No chest wall mass or suspicious osseous findings. Multilevel spondylosis, most advanced in the cervical spine. There are scattered Schmorl's nodes, most prominent within the superior endplate of T11. CT ABDOMEN AND PELVIS FINDINGS Hepatobiliary: The liver is normal in density without suspicious focal abnormality. No evidence of gallstones, gallbladder wall thickening or biliary dilatation. Pancreas: Unremarkable. No pancreatic ductal dilatation or surrounding inflammatory changes. Spleen: Normal in size without focal abnormality. Adrenals/Urinary Tract: Both adrenal glands appear normal. Previously demonstrated cystic lesion in the interpolar region of the left kidney is no longer demonstrated. There is no evidence of urinary tract calculus or suspicious focal renal lesion. Mild chronic dilatation of the renal pelves and ureters without significantly delayed contrast excretion or perinephric soft tissue stranding. The bladder appears normal for its degree of distention. Stomach/Bowel: No enteric contrast administered. The stomach appears unremarkable for its degree of distention. There is no small bowel distension, wall thickening or surrounding inflammation. The appendix is not as well seen currently, but there is no pericecal inflammation to suggest appendicitis. The colon is fluid-filled without significant distension. There is possible mild wall thickening at the splenic flexure with mild surrounding soft tissue stranding. The distal colon appears unremarkable. Vascular/Lymphatic: There are no enlarged abdominal or pelvic lymph nodes. Aortic and branch vessel atherosclerosis without evidence of  aneurysm or large vessel occlusion. The renal veins, IVC, portal and superior mesenteric veins are patent. Reproductive: By history, the previously demonstrated large complex cystic mass in the right pelvis was surgically resected in the interval. There is a recurrent similar appearing mass, currently measures 13.9 x 7.8 cm on axial image 71/4 and 8.0 cm on coronal image 79/6 (previous measurements approximately 16.0 x 12.7 x 11.4 cm). This lesion remains largely cystic with a few mildly thickened septations. No left adnexal mass. Previous hysterectomy. Other: Intact abdominal wall. Trace ascites without focal extraluminal fluid collection or pneumoperitoneum. As above, possible soft tissue stranding around the splenic flexure of the colon with possible omental edema. No discrete peritoneal nodularity identified. Musculoskeletal: No acute or significant osseous findings. Chronic Schmorl's node in the superior endplate of L1. Review of the MIP images confirms the above findings. IMPRESSION: 1. No evidence of acute pulmonary embolism or other acute vascular findings in the chest. 2. 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 Associated ascites and possible omental edema concerning for peritoneal spread of tumor. 3. Fluid-filled colon without significant distension, suggesting diarrheal illness. Possible mild wall thickening and surrounding soft tissue stranding at the splenic flexure of the colon, suspicious for colitis versus peritoneal disease (as above). 4.  Aortic Atherosclerosis (ICD10-I70.0). Electronically Signed   By: Elsie Perone M.D.   On: 01/13/2024 16:53   CT ABDOMEN PELVIS W CONTRAST Result Date: 01/13/2024 CLINICAL DATA:  Pulmonary embolism (PE) suspected, high prob; Abdominal pain, acute, nonlocalized cancer hx ? - hypotension, abd pain Altered mental status with weakness, low abdominal pain and hypotension.  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 EXAM: CT ANGIOGRAPHY CHEST CT ABDOMEN AND PELVIS WITH CONTRAST TECHNIQUE: Multidetector CT imaging of the chest was performed using the standard protocol during bolus administration  of intravenous contrast. Multiplanar CT image reconstructions and MIPs were obtained to evaluate the vascular anatomy. Multidetector CT imaging of the abdomen and pelvis was performed using the standard protocol during bolus administration of intravenous contrast. RADIATION DOSE REDUCTION: This exam was performed according to the departmental dose-optimization program which includes automated exposure control, adjustment of the mA and/or kV according to patient size and/or use of iterative reconstruction technique. CONTRAST:  OMNIPAQUE  IOHEXOL  350 MG/ML SOLN COMPARISON:  Chest radiographs 01/13/2024, abdominopelvic CT 05/03/2020. Pelvic ultrasound 05/02/2020. FINDINGS: CTA CHEST FINDINGS Cardiovascular: The pulmonary arteries are well opacified with contrast to the level of the segmental branches. There is no evidence of acute pulmonary embolism. Atherosclerosis of the aorta, great vessels and coronary arteries. Medially displaced retropharyngeal carotid arteries bilaterally. No acute systemic arterial abnormalities are identified. The heart is mildly enlarged. There are calcifications of the aortic valve. No significant pericardial fluid. Mediastinum/Nodes: There are no enlarged mediastinal, hilar or axillary lymph nodes. The thyroid gland, trachea and esophagus demonstrate no significant findings. Lungs/Pleura: No pleural effusion or pneumothorax. Bandlike opacity extending superiorly from the right hilum, likely reflecting an area of scarring. Additional more linear scarring at both lung bases. No confluent airspace disease or suspicious pulmonary nodularity. Musculoskeletal/Chest wall: No chest wall mass or suspicious  osseous findings. Multilevel spondylosis, most advanced in the cervical spine. There are scattered Schmorl's nodes, most prominent within the superior endplate of T11. CT ABDOMEN AND PELVIS FINDINGS Hepatobiliary: The liver is normal in density without suspicious focal abnormality. No evidence of gallstones, gallbladder wall thickening or biliary dilatation. Pancreas: Unremarkable. No pancreatic ductal dilatation or surrounding inflammatory changes. Spleen: Normal in size without focal abnormality. Adrenals/Urinary Tract: Both adrenal glands appear normal. Previously demonstrated cystic lesion in the interpolar region of the left kidney is no longer demonstrated. There is no evidence of urinary tract calculus or suspicious focal renal lesion. Mild chronic dilatation of the renal pelves and ureters without significantly delayed contrast excretion or perinephric soft tissue stranding. The bladder appears normal for its degree of distention. Stomach/Bowel: No enteric contrast administered. The stomach appears unremarkable for its degree of distention. There is no small bowel distension, wall thickening or surrounding inflammation. The appendix is not as well seen currently, but there is no pericecal inflammation to suggest appendicitis. The colon is fluid-filled without significant distension. There is possible mild wall thickening at the splenic flexure with mild surrounding soft tissue stranding. The distal colon appears unremarkable. Vascular/Lymphatic: There are no enlarged abdominal or pelvic lymph nodes. Aortic and branch vessel atherosclerosis without evidence of aneurysm or large vessel occlusion. The renal veins, IVC, portal and superior mesenteric veins are patent. Reproductive: By history, the previously demonstrated large complex cystic mass in the right pelvis was surgically resected in the interval. There is a recurrent similar appearing mass, currently measures 13.9 x 7.8 cm on axial image 71/4 and 8.0  cm on coronal image 79/6 (previous measurements approximately 16.0 x 12.7 x 11.4 cm). This lesion remains largely cystic with a few mildly thickened septations. No left adnexal mass. Previous hysterectomy. Other: Intact abdominal wall. Trace ascites without focal extraluminal fluid collection or pneumoperitoneum. As above, possible soft tissue stranding around the splenic flexure of the colon with possible omental edema. No discrete peritoneal nodularity identified. Musculoskeletal: No acute or significant osseous findings. Chronic Schmorl's node in the superior endplate of L1. Review of the MIP images confirms the above findings. IMPRESSION: 1. No evidence of acute pulmonary embolism or other acute vascular findings in  the chest. 2. 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 Associated ascites and possible omental edema concerning for peritoneal spread of tumor. 3. Fluid-filled colon without significant distension, suggesting diarrheal illness. Possible mild wall thickening and surrounding soft tissue stranding at the splenic flexure of the colon, suspicious for colitis versus peritoneal disease (as above). 4.  Aortic Atherosclerosis (ICD10-I70.0). Electronically Signed   By: Elsie Perone M.D.   On: 01/13/2024 16:53   DG Chest Port 1 View Result Date: 01/13/2024 CLINICAL DATA:  Suspected sepsis. EXAM: PORTABLE CHEST 1 VIEW COMPARISON:  Chest radiograph dated 04/11/2022. FINDINGS: There is mild cardiomegaly with mild vascular congestion. No focal consolidation, pleural effusion, pneumothorax. A loop recorder device noted. No acute osseous pathology. IMPRESSION: Mild cardiomegaly with mild vascular congestion. No focal consolidation. Electronically Signed   By: Vanetta Chou M.D.   On: 01/13/2024 13:20    Pending Labs Unresulted Labs (From admission, onward)     Start     Ordered   01/13/24 1222  CBC with  Differential  Once,   STAT        01/13/24 1221   01/13/24 1222  Culture, blood (Routine x 2)  BLOOD CULTURE X 2,   R (with STAT occurrences)      01/13/24 1221            Vitals/Pain Today's Vitals   01/13/24 1700 01/13/24 1730 01/13/24 1800 01/13/24 2000  BP: (!) 111/54 (!) 106/49 (!) 107/59 (!) 106/46  Pulse: 83 82 82 83  Resp: (!) 22 (!) 22 (!) 23 (!) 23  Temp:   98 F (36.7 C)   TempSrc:      SpO2: 94% 93% 93% 96%  Weight:      Height:      PainSc:        Isolation Precautions No active isolations  Medications Medications  lactated ringers  infusion ( Intravenous New Bag/Given 01/13/24 1550)  sodium chloride  0.9 % bolus 2,000 mL (2,000 mLs Intravenous Bolus 01/13/24 1215)  ceFEPIme  (MAXIPIME ) 2 g in sodium chloride  0.9 % 100 mL IVPB (0 g Intravenous Stopped 01/13/24 1621)  metroNIDAZOLE  (FLAGYL ) IVPB 500 mg (0 mg Intravenous Stopped 01/13/24 1732)  vancomycin  (VANCOREADY) IVPB 2000 mg/400 mL (0 mg Intravenous Stopped 01/13/24 1948)  iohexol  (OMNIPAQUE ) 350 MG/ML injection 100 mL (100 mLs Intravenous Contrast Given 01/13/24 1611)    Mobility      Focused Assessments    R Recommendations: See Admitting Provider Note  Report given to: Cheyanne  Additional Notes:

## 2024-01-13 NOTE — ED Provider Notes (Signed)
  Physical Exam  BP (!) 117/57   Pulse 83   Temp 98.1 F (36.7 C)   Resp (!) 23   Ht 5' 4 (1.626 m)   Wt 99 kg   SpO2 95%   BMI 37.46 kg/m   Physical Exam  Procedures  .Critical Care  Performed by: Francesca Elsie CROME, MD Authorized by: Francesca Elsie CROME, MD   Critical care provider statement:    Critical care time (minutes):  35   Critical care was necessary to treat or prevent imminent or life-threatening deterioration of the following conditions:  Sepsis and dehydration   Critical care was time spent personally by me on the following activities:  Development of treatment plan with patient or surrogate, discussions with consultants, evaluation of patient's response to treatment, examination of patient, ordering and review of laboratory studies, ordering and review of radiographic studies, ordering and performing treatments and interventions, pulse oximetry, re-evaluation of patient's condition and review of old charts   I assumed direction of critical care for this patient from another provider in my specialty: yes     Care discussed with: admitting provider     ED Course / MDM   Clinical Course as of 01/13/24 1746  Mon Jan 13, 2024  1557 Recevied sign out from Dr. Charlyn pending CT scans. Presented with weakness, altered, hypotensive. Patient has received fluids. Pending repeat lactic. Will need admission. Patient has no complaints beside belly pain [WS]  1615 Sepsis hemodynamic reassessment completed.  Patient has received final 500 cc per EMS.  We ordered 2000 more mL in the ER.   CT scans have been ordered at this time.  Her care has been signed out to incoming team.  Patient's blood pressure has responded to the IV fluids.  Anticipate admission after CT scans. [AN]  1745 Patient received additional fluids.  She reports that she does feel much better.  Denies abdominal pain at this time.  Seems to be still improving from her hypotension.  Repeat lactic is still  elevated so we will order additional lactic.  Discussed with hospitalist who will admit patient. [WS]    Clinical Course User Index [AN] Charlyn Sora, MD [WS] Francesca Elsie CROME, MD   Medical Decision Making Amount and/or Complexity of Data Reviewed Labs: ordered. Radiology: ordered.  Risk Prescription drug management. Decision regarding hospitalization.          Francesca Elsie CROME, MD 01/13/24 [phone]

## 2024-01-13 NOTE — Sepsis Progress Note (Signed)
 Notified provider and bedside nurse of need to order and draw repeat lactic acid #3 at 1745.

## 2024-01-13 NOTE — Assessment & Plan Note (Signed)
 Presenting with hypotension. -Hold benazepril  amlodipine , Lasix 20 mg daily, metoprolol  25 twice daily

## 2024-01-13 NOTE — Assessment & Plan Note (Addendum)
 Presenting with lower abdominal pain.  Initial constipation appears to have resolved, had 3 bowel movements in Ed, the last 2 were watery.  Afebrile without leukocytosis.  CT -APWC-colitis versus peritoneal disease, fluid-filled colon suggesting diarrheal illness. -Stool C. difficile - GI pathogen panel - 2 L bolus given, cont N/s 75cc/hr -Bowel rest with clear liquid diet -Zofran  as needed - IV Ceftriaxone  and metronidazole 

## 2024-01-13 NOTE — Assessment & Plan Note (Addendum)
 Blood pressure on arrival 71/45 improved with fluid bolus.  Lactic acidosis likely from hypotension- 3.3> 3.5> 2.8. Likely combination of poor oral intake, colitis, antihypertensives-amlodipine  benazepril  and metoprolol  and diuretic Lasix. - Continue hydration

## 2024-01-13 NOTE — Sepsis Progress Note (Signed)
 Per bedside RN EMS gave 500 cc fluid

## 2024-01-14 DIAGNOSIS — E1142 Type 2 diabetes mellitus with diabetic polyneuropathy: Secondary | ICD-10-CM | POA: Diagnosis present

## 2024-01-14 DIAGNOSIS — I959 Hypotension, unspecified: Secondary | ICD-10-CM | POA: Diagnosis present

## 2024-01-14 DIAGNOSIS — E1165 Type 2 diabetes mellitus with hyperglycemia: Secondary | ICD-10-CM | POA: Diagnosis present

## 2024-01-14 DIAGNOSIS — E785 Hyperlipidemia, unspecified: Secondary | ICD-10-CM | POA: Diagnosis present

## 2024-01-14 DIAGNOSIS — Z90710 Acquired absence of both cervix and uterus: Secondary | ICD-10-CM | POA: Diagnosis not present

## 2024-01-14 DIAGNOSIS — K529 Noninfective gastroenteritis and colitis, unspecified: Secondary | ICD-10-CM | POA: Diagnosis present

## 2024-01-14 DIAGNOSIS — I10 Essential (primary) hypertension: Secondary | ICD-10-CM | POA: Diagnosis present

## 2024-01-14 DIAGNOSIS — Z79899 Other long term (current) drug therapy: Secondary | ICD-10-CM | POA: Diagnosis not present

## 2024-01-14 DIAGNOSIS — Z90722 Acquired absence of ovaries, bilateral: Secondary | ICD-10-CM | POA: Diagnosis not present

## 2024-01-14 DIAGNOSIS — R1900 Intra-abdominal and pelvic swelling, mass and lump, unspecified site: Secondary | ICD-10-CM | POA: Diagnosis present

## 2024-01-14 DIAGNOSIS — Z7982 Long term (current) use of aspirin: Secondary | ICD-10-CM | POA: Diagnosis not present

## 2024-01-14 DIAGNOSIS — Z8249 Family history of ischemic heart disease and other diseases of the circulatory system: Secondary | ICD-10-CM | POA: Diagnosis not present

## 2024-01-14 DIAGNOSIS — H5461 Unqualified visual loss, right eye, normal vision left eye: Secondary | ICD-10-CM | POA: Diagnosis present

## 2024-01-14 DIAGNOSIS — N179 Acute kidney failure, unspecified: Secondary | ICD-10-CM | POA: Diagnosis present

## 2024-01-14 DIAGNOSIS — Z8673 Personal history of transient ischemic attack (TIA), and cerebral infarction without residual deficits: Secondary | ICD-10-CM | POA: Diagnosis not present

## 2024-01-14 DIAGNOSIS — E860 Dehydration: Secondary | ICD-10-CM | POA: Diagnosis present

## 2024-01-14 DIAGNOSIS — Z7984 Long term (current) use of oral hypoglycemic drugs: Secondary | ICD-10-CM | POA: Diagnosis not present

## 2024-01-14 DIAGNOSIS — E872 Acidosis, unspecified: Secondary | ICD-10-CM | POA: Diagnosis present

## 2024-01-14 DIAGNOSIS — R188 Other ascites: Secondary | ICD-10-CM | POA: Diagnosis present

## 2024-01-14 DIAGNOSIS — Z794 Long term (current) use of insulin: Secondary | ICD-10-CM | POA: Diagnosis not present

## 2024-01-14 DIAGNOSIS — E861 Hypovolemia: Secondary | ICD-10-CM | POA: Diagnosis not present

## 2024-01-14 DIAGNOSIS — R579 Shock, unspecified: Secondary | ICD-10-CM | POA: Diagnosis present

## 2024-01-14 LAB — BLOOD CULTURE ID PANEL (REFLEXED) - BCID2

## 2024-01-14 LAB — BASIC METABOLIC PANEL WITH GFR
Anion gap: 9 (ref 5–15)
BUN: 15 mg/dL (ref 8–23)
CO2: 18 mmol/L — ABNORMAL LOW (ref 22–32)
Calcium: 8.2 mg/dL — ABNORMAL LOW (ref 8.9–10.3)
Chloride: 108 mmol/L (ref 98–111)
Creatinine, Ser: 0.87 mg/dL (ref 0.44–1.00)
GFR, Estimated: >60 mL/min (ref >60)
Glucose, Bld: 244 mg/dL — ABNORMAL HIGH (ref 70–99)
Potassium: 4.2 mmol/L (ref 3.5–5.1)
Sodium: 135 mmol/L (ref 135–145)

## 2024-01-14 LAB — HEMOGLOBIN A1C
Hgb A1c MFr Bld: 8.6 % — ABNORMAL HIGH (ref 4.8–5.6)
Mean Plasma Glucose: 200.12 mg/dL

## 2024-01-14 LAB — CBC
HCT: 31.7 % — ABNORMAL LOW (ref 36.0–46.0)
Hemoglobin: 10.7 g/dL — ABNORMAL LOW (ref 12.0–15.0)
MCH: 28.2 pg (ref 26.0–34.0)
MCHC: 33.8 g/dL (ref 30.0–36.0)
MCV: 83.6 fL (ref 80.0–100.0)
Platelets: 267 K/uL (ref 150–400)
RBC: 3.79 MIL/uL — ABNORMAL LOW (ref 3.87–5.11)
RDW: 14.6 % (ref 11.5–15.5)
WBC: 18.6 K/uL — ABNORMAL HIGH (ref 4.0–10.5)
nRBC: 0 % (ref 0.0–0.2)

## 2024-01-14 LAB — GLUCOSE, CAPILLARY
Glucose-Capillary: 135 mg/dL — ABNORMAL HIGH (ref 70–99)
Glucose-Capillary: 243 mg/dL — ABNORMAL HIGH (ref 70–99)
Glucose-Capillary: 245 mg/dL — ABNORMAL HIGH (ref 70–99)
Glucose-Capillary: 251 mg/dL — ABNORMAL HIGH (ref 70–99)
Glucose-Capillary: 252 mg/dL — ABNORMAL HIGH (ref 70–99)

## 2024-01-14 LAB — C DIFFICILE QUICK SCREEN W PCR REFLEX
C Diff antigen: NEGATIVE
C Diff interpretation: NOT DETECTED
C Diff toxin: NEGATIVE

## 2024-01-14 MED ORDER — INSULIN GLARGINE-YFGN 100 UNIT/ML ~~LOC~~ SOLN
7.0000 [IU] | Freq: Every day | SUBCUTANEOUS | Status: DC
  Administered 2024-01-14: 7 [IU] via SUBCUTANEOUS
  Filled 2024-01-14 (×2): qty 0.07

## 2024-01-14 MED ORDER — INSULIN GLARGINE-YFGN 100 UNIT/ML ~~LOC~~ SOLN
10.0000 [IU] | Freq: Every day | SUBCUTANEOUS | Status: DC
  Administered 2024-01-15: 10 [IU] via SUBCUTANEOUS
  Filled 2024-01-14 (×2): qty 0.1

## 2024-01-14 MED ORDER — ORAL CARE MOUTH RINSE: 15.0000 mL | OROMUCOSAL | Status: DC | PRN

## 2024-01-14 MED ORDER — ENOXAPARIN SODIUM 40 MG/0.4ML IJ SOSY
40.0000 mg | PREFILLED_SYRINGE | INTRAMUSCULAR | Status: DC
  Administered 2024-01-14: 40 mg via SUBCUTANEOUS
  Filled 2024-01-14: qty 0.4

## 2024-01-14 NOTE — Progress Notes (Signed)
 Progress Note   Patient: Jean Watts FMW:[id number] DOB: 09/20/1940 DOA: 01/13/2024     0 DOS: the patient was seen and examined on 01/14/2024   Brief hospital admission narrative: As per H&P written by Dr. Pearlean on 01/13/2024 Jean Watts is a 83 y.o. female with medical history significant for stroke, diabetes mellitus, hypertension, ovarian mass Patient presented to the ED via EMS for reports of generalized weakness and lower abdominal pain.  Daughter reports she came out of the shower and saw her mother awake but slumped back in the chair.  EMS reports blood pressure of 69/43. Daughter reports poor oral intake for several days due to poor appetite, 1 episode of vomiting last night.  Patient reports she has not had a bowel movement in 2 weeks.  In the ED here - patient had 3 large bowel movements the last 2 of which were watery. No fevers no chills.  No chest pain or difficulty breathing.  No urinary symptoms. Daughter- Avelina at bedside, denies history of any cancer in patient.    ED Course: Blood pressure down to 71/45.  Temp 98.  Heart rate 57-90.  Respiratory rate 18-28.  O2 sats 92 to 99%.  Lactic acid 3.3 > 3.5. 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  2L bolus given with improvement in blood pressure.  Broad-spectrum antibiotics IV vancomycin  metronidazole  cefepime  started.  Assessment and plan  Colitis -Continue advancing diet as tolerated - Continue current antibiotics - Follow stool studies - Continue to maintain adequate hydration - Electrolytes and replete as needed - Follow clinical response.    Diabetic polyneuropathy (HCC) - Update A1c - Follow CBG fluctuation and adjust regimen as needed. - Continue sliding scale insulin  and Semglee  - Patient advised to maintain adequate hydration  Essential  hypertension -Patient presenting with hypertension - Continue holding antihypertensive agents at the moment. - Follow vital signs. -  History of stroke -With residual right eye blindness - Continue risk factor modification - No acute neurologic complaints.  Pelvic mass in female-large septated mass in the pelvis measuring up 13.7 CM-?? Ovarian Origin Underwent - robotic assisted right salpingo-oophorectomy on July 05, 2020 by Dr. Eloy.  She had a cyst at that time that was unavoidably ruptured during surgery due to dense adhesions.  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 -CT abdomen and pelvis today shows recurrent cystic pelvic mass-consistent with tumor recurrence.  Associated ascites and possible omental edema concerning for peritoneal spread of tumor. -Follow-up as outpatient to determine further management and interventions.   Subjective:  No fever, no chest pain, no nausea or vomiting.  Good saturation on room air.  Reports feeling better and having slight improvement in her appetite.  Physical Exam: Vitals:   01/14/24 0507 01/14/24 0924 01/14/24 1050 01/14/24 1235  BP: (!) 102/49 (!) 106/48  (!) 90/54  Pulse: 79 73  82  Resp: 18     Temp: 98.3 F (36.8 C) 97.8 F (36.6 C)  98.7 F (37.1 C)  TempSrc:  Oral  Oral  SpO2: 98% 100% 97% 99%  Weight:      Height:       General exam: Alert, awake, oriented x 3; reports feeling better and having no nausea vomiting. Respiratory system: Good saturation on room air. Cardiovascular system: No rubs, no gallops, no JVD. Gastrointestinal system: Abdomen is nondistended, soft and  without guarding.  Positive bowel sounds appreciated. Central nervous system: No focal neurological deficits. Extremities: No cyanosis or clubbing. Skin: No petechiae. Psychiatry: Flat affect appreciated on exam.  Data Reviewed: CT: Pending - GI pathogen panel: Pending Basic metabolic  panel: Sodium 135, potassium 4.2, chloride 108, bicarb 18, glucose 244, BUN 15, creatinine 0.87 and GFR >60 CBC: WBCs 18.6, hemoglobin 10.7 and platelet count 267K   Family Communication: Grandson  Disposition: Status is: Inpatient Remains inpatient appropriate because: Continue IV therapy.  To be determined; physical therapy examination has been requested.  Time spent: 50 minutes  Author: Eric Nunnery, MD 01/14/2024 5:47 PM  For on call review [email or web address].

## 2024-01-14 NOTE — Progress Notes (Signed)
 Carelink Summary Report / Loop Recorder

## 2024-01-14 NOTE — Progress Notes (Signed)
   01/14/24 0956  TOC Brief Assessment  Insurance and Status Reviewed  Patient has primary care physician Yes  Home environment has been reviewed Home  Prior level of function: needs assistance  Prior/Current Home Services No current home services  Social Drivers of Health Review SDOH reviewed no interventions necessary  Readmission risk has been reviewed Yes  Transition of care needs no transition of care needs at this time   Transition of Care Department [facility]) has reviewed patient and no TOC needs have been identified at this time. We will continue to monitor patient advancement through interdisciplinary progression rounds. If new patient transition needs arise, please place a TOC consult.

## 2024-01-14 NOTE — Inpatient Diabetes Management (Signed)
 Inpatient Diabetes Program Recommendations  AACE/ADA: New Consensus Statement on Inpatient Glycemic Control   Target Ranges:  Prepandial:   less than 140 mg/dL      Peak postprandial:   less than 180 mg/dL (1-2 hours)      Critically ill patients:  140 - 180 mg/dL    Latest Reference Range & Units 01/13/24 12:15 01/13/24 22:15 01/14/24 05:13  Glucose-Capillary 70 - 99 mg/dL 760 (H) 728 (H) 747 (H)   Review of Glycemic Control  Diabetes history: DM2 Outpatient Diabetes medications: NPH 40 units QAM, NPH 20 units QPM, Metformin 1000 mg BID Current orders for Inpatient glycemic control: Novolog  0-15 units Q6H  Inpatient Diabetes Program Recommendations:    Insulin : Please consider ordering Semglee  10 units Q24H.  Thanks, Earnie Gainer, RN, MSN, CDCES Diabetes Coordinator Inpatient Diabetes Program [phone] (Team Pager from 8am to 5pm)

## 2024-01-14 NOTE — Plan of Care (Signed)
   Problem: Activity: Goal: Risk for activity intolerance will decrease Outcome: Progressing   Problem: Coping: Goal: Level of anxiety will decrease Outcome: Progressing

## 2024-01-14 NOTE — Plan of Care (Signed)

## 2024-01-15 DIAGNOSIS — E861 Hypovolemia: Secondary | ICD-10-CM | POA: Diagnosis not present

## 2024-01-15 LAB — BASIC METABOLIC PANEL WITH GFR
Anion gap: 6 (ref 5–15)
BUN: 8 mg/dL (ref 8–23)
CO2: 21 mmol/L — ABNORMAL LOW (ref 22–32)
Calcium: 8.2 mg/dL — ABNORMAL LOW (ref 8.9–10.3)
Chloride: 111 mmol/L (ref 98–111)
Creatinine, Ser: 0.72 mg/dL (ref 0.44–1.00)
GFR, Estimated: >60 mL/min (ref >60)
Glucose, Bld: 115 mg/dL — ABNORMAL HIGH (ref 70–99)
Potassium: 3.8 mmol/L (ref 3.5–5.1)
Sodium: 138 mmol/L (ref 135–145)

## 2024-01-15 LAB — CULTURE, BLOOD (ROUTINE X 2): Special Requests: ADEQUATE

## 2024-01-15 LAB — CBC
HCT: 28.8 % — ABNORMAL LOW (ref 36.0–46.0)
Hemoglobin: 9.8 g/dL — ABNORMAL LOW (ref 12.0–15.0)
MCH: 28.3 pg (ref 26.0–34.0)
MCHC: 34 g/dL (ref 30.0–36.0)
MCV: 83.2 fL (ref 80.0–100.0)
Platelets: 226 K/uL (ref 150–400)
RBC: 3.46 MIL/uL — ABNORMAL LOW (ref 3.87–5.11)
RDW: 14.6 % (ref 11.5–15.5)
WBC: 9.3 K/uL (ref 4.0–10.5)
nRBC: 0 % (ref 0.0–0.2)

## 2024-01-15 LAB — MAGNESIUM: Magnesium: 1.8 mg/dL (ref 1.7–2.4)

## 2024-01-15 LAB — GLUCOSE, CAPILLARY
Glucose-Capillary: 120 mg/dL — ABNORMAL HIGH (ref 70–99)
Glucose-Capillary: 238 mg/dL — ABNORMAL HIGH (ref 70–99)

## 2024-01-15 MED ORDER — METRONIDAZOLE 500 MG PO TABS
500.0000 mg | ORAL_TABLET | Freq: Three times a day (TID) | ORAL | 0 refills | Status: AC
Start: 2024-01-15 — End: 2024-01-20

## 2024-01-15 MED ORDER — CIPROFLOXACIN HCL 500 MG PO TABS: 500.0000 mg | ORAL_TABLET | Freq: Two times a day (BID) | ORAL | 0 refills | Status: AC

## 2024-01-15 MED ORDER — LACTINEX PO CHEW
1.0000 | CHEWABLE_TABLET | Freq: Three times a day (TID) | ORAL | 0 refills | Status: AC
Start: 2024-01-15 — End: 2024-01-25

## 2024-01-15 NOTE — Plan of Care (Signed)

## 2024-01-15 NOTE — TOC Transition Note (Signed)
 Transition of Care [facility]) - Discharge Note   Patient Details  Name: Jean Watts MRN: [id number] Date of Birth: 1940-09-20  [facility] CM/SW Contact:  Sharlyne Stabs, RN Phone Number: 01/15/2024, 11:06 AM   Clinical Narrative:   Patient discharging home with family. MD ordering HHPT/OT/SW.  No preferences Darleene with [facility] accepted the referral. Daughter Avelina updated with Home health agency.     Final next level of care: Home w Home Health Services Barriers to Discharge: Barriers Resolved   Patient Goals and CMS Choice Patient states their goals for this hospitalization and ongoing recovery are:: Return home CMS Medicare.gov Compare Post Acute Care list provided to:: Patient Choice offered to / list presented to : Patient      Discharge Placement                  Name of family member notified: Avelina Patient and family notified of of transfer: 01/15/24  Discharge Plan and Services Additional resources added to the After Visit Summary for          [facility] Arranged: PT, OT HH Agency: [facility] Date [facility] Agency Contacted: 01/15/24 Time HH Agency Contacted: [phone] Representative spoke with at [facility] Agency: Darleene  Social Drivers of Health (SDOH) Interventions SDOH Screenings   Food Insecurity: No Food Insecurity (01/13/2024)  Housing: Low Risk  (01/13/2024)  Transportation Needs: No Transportation Needs (01/13/2024)  Utilities: Not At Risk (01/13/2024)  Social Connections: Socially Isolated (01/13/2024)  Tobacco Use: Low Risk  (01/13/2024)     Readmission Risk Interventions     No data to display

## 2024-01-15 NOTE — Discharge Summary (Signed)
 Physician Discharge Summary   Patient: Jean Watts MRN: [id number] DOB: 21-Nov-1940  Admit date:     01/13/2024  Discharge date: 01/15/24  Discharge Physician: Adriana DELENA Grams   PCP: Carlette Benita Area, MD   Recommendations at discharge:   Follow with the PCP in 1 week Note to PCP holding BP medication with exception of metoprolol  due to dehydration, hypotension on this admission Follow-up with OB/GYN Dr. Eloy for large benign pelvic mass  Discharge Diagnoses: Principal Problem:   Hypotension Active Problems:   Colitis   History of stroke   Essential hypertension   Diabetic polyneuropathy (HCC)   Blind right eye   Pelvic mass in female-large septated mass in the pelvis measuring up 13.7 CM-?? Ovarian Origin   Cardiac murmur  Resolved Problems:   * No resolved hospital problems. *   * Hypotension Blood pressure on arrival 71/45 improved with fluid bolus.  Lactic acidosis likely from hypotension- 3.3> 3.5> 2.8. Likely combination of poor oral intake, colitis, antihypertensives-amlodipine  benazepril  -has been on hold, continue to hold  Resuming home medication of metoprolol  and diuretic Lasix. - S/p IV hydration  Colitis Presenting with lower abdominal pain.  Initial constipation appears to have resolved, had 3 bowel movements in Ed, the last 2 were watery.  Afebrile without leukocytosis.  CT -[facility]-colitis versus peritoneal disease, fluid-filled colon suggesting diarrheal illness. -Stool C. Difficile-stool improved, did not meet the criteria for C. difficile - GI pathogen still pending - 2 L bolus given, cont N/s 75cc/hr-tolerating p.o., discontinuing IVF -Clear liquid diet, advancing as tolerated -Zofran  as needed - IV Ceftriaxone  and metronidazole -switching to p.o. ciprofloxacin  and Flagyl   Pelvic mass in female-large septated mass in the pelvis measuring up 13.7 CM-?? Ovarian Origin Underwent - robotic assisted right salpingo-oophorectomy on July 05, 2020 by  Dr. Eloy.  She had a cyst at that time that was unavoidably ruptured during surgery due to dense adhesions.  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 -CT abdomen and pelvis today shows recurrent cystic pelvic mass-consistent with tumor recurrence.  Associated ascites and possible omental edema concerning for peritoneal spread of tumor. -Follow-up as outpatient - 01/15/2024 discussed with patient's daughter in detail-recommended continued follow-up with Dr. Eloy as an outpatient  Diabetic mellitus with polyneuropathy (HCC) - HgbA1c: 8.6 - Resume home regimen (insulin  and metformin _(medication will be titrated as p.o. intake improves) - Recommending strict carb modified diabetic diet   Essential hypertension Presenting with hypotension. 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    Disposition: Home Diet recommendation:  Discharge Diet Orders (From admission, onward)     Start     Ordered   01/15/24 0000  Diet - low sodium heart healthy        01/15/24 1037           Cardiac and Carb modified diet DISCHARGE MEDICATION: Allergies as of 01/15/2024   No Known Allergies      Medication List     PAUSE taking these medications    amLODipine -benazepril  5-10 MG capsule Wait to take this until: January 20, 2024 Commonly known as: LOTREL Take 1 capsule by mouth daily.   furosemide 20 MG tablet Wait to take this until: January 17, 2024 Commonly known as: LASIX Take 20 mg by mouth daily.       STOP taking these medications    meclizine  25 MG tablet Commonly known as:  ANTIVERT        TAKE these medications    Accu-Chek Aviva Plus test strip Generic drug: glucose blood   acetaminophen  500 MG tablet Commonly known as: TYLENOL  Take 500 mg by mouth every 6 (six) hours as needed for moderate pain (pain score 4-6).   amitriptyline  50  MG tablet Commonly known as: ELAVIL  Take 50 mg by mouth at bedtime.   aspirin  325 MG tablet Take 1 tablet (325 mg total) by mouth daily with breakfast.   atorvastatin  20 MG tablet Commonly known as: LIPITOR Take 20 mg by mouth daily.   BD Insulin  Syringe U/F 31G X 5/16 1 ML Misc Generic drug: Insulin  Syringe-Needle U-100 2 (two) times daily. as directed   ciprofloxacin  500 MG tablet Commonly known as: Cipro  Take 1 tablet (500 mg total) by mouth 2 (two) times daily for 5 days. Start taking on: January 16, 2024   gabapentin  100 MG capsule Commonly known as: NEURONTIN  Take 100-300 mg by mouth 3 (three) times daily. Take 1 capsule (100 mg) by mouth in the morning and midday, then take 3 capsules (300 mg) by mouth in the evening.   HumuLIN  N KwikPen 100 UNIT/ML KwikPen Generic drug: Insulin  NPH (Human) (Isophane) Inject 20-40 Units into the skin 2 (two) times daily with a meal. Inject 40 units into the skin each morning and 20 units each evening.   lactobacillus acidophilus & bulgar chewable tablet Chew 1 tablet by mouth 3 (three) times daily with meals for 10 days.   metFORMIN 1000 MG tablet Commonly known as: GLUCOPHAGE Take 1,000 mg by mouth 2 (two) times daily.   metoprolol  tartrate 25 MG tablet Commonly known as: LOPRESSOR  Take 25 mg by mouth 2 (two) times daily.   metroNIDAZOLE  500 MG tablet Commonly known as: FLAGYL  Take 1 tablet (500 mg total) by mouth 3 (three) times daily for 5 days.   Restasis  0.05 % ophthalmic emulsion Generic drug: cycloSPORINE  Place 1 drop into both eyes 2 (two) times daily.        Discharge Exam: Filed Weights   01/13/24 1216  Weight: 99 kg        General:  AAO x 3,  cooperative, no distress;   HEENT:  Chronic right eye blindness normocephalic, PERRL, otherwise with in Normal limits   Neuro:  CNII-XII intact. , normal motor and sensation, reflexes intact   Lungs:   Clear to auscultation BL, Respirations unlabored,  No wheezes /  crackles  Cardio:    S1/S2, RRR, No murmure, No Rubs or Gallops   Abdomen:  Soft, non-tender, bowel sounds active all four quadrants, no guarding or peritoneal signs.  Muscular  skeletal:  Limited exam -global generalized weaknesses - in bed, able to move all 4 extremities,   2+ pulses,  symmetric, No pitting edema  Skin:  Dry, warm to touch, negative for any Rashes,  Wounds: Please see nursing documentation          Condition at discharge: good  The results of significant diagnostics from this hospitalization (including imaging, microbiology, ancillary and laboratory) are listed below for reference.   Imaging Studies: CT Angio Chest PE W and/or Wo Contrast Result Date: 01/13/2024 CLINICAL DATA:  Pulmonary embolism (PE) suspected, high prob; Abdominal pain, acute, nonlocalized cancer hx ? - hypotension, abd pain Altered mental status with weakness, low abdominal pain and hypotension. 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 EXAM: CT ANGIOGRAPHY CHEST CT ABDOMEN AND PELVIS WITH CONTRAST TECHNIQUE: Multidetector  CT imaging of the chest was performed using the standard protocol during bolus administration of intravenous contrast. Multiplanar CT image reconstructions and MIPs were obtained to evaluate the vascular anatomy. Multidetector CT imaging of the abdomen and pelvis was performed using the standard protocol during bolus administration of intravenous contrast. RADIATION DOSE REDUCTION: This exam was performed according to the departmental dose-optimization program which includes automated exposure control, adjustment of the mA and/or kV according to patient size and/or use of iterative reconstruction technique. CONTRAST:  OMNIPAQUE  IOHEXOL  350 MG/ML SOLN COMPARISON:  Chest radiographs 01/13/2024, abdominopelvic CT 05/03/2020. Pelvic ultrasound 05/02/2020. FINDINGS: CTA CHEST FINDINGS Cardiovascular: The pulmonary  arteries are well opacified with contrast to the level of the segmental branches. There is no evidence of acute pulmonary embolism. Atherosclerosis of the aorta, great vessels and coronary arteries. Medially displaced retropharyngeal carotid arteries bilaterally. No acute systemic arterial abnormalities are identified. The heart is mildly enlarged. There are calcifications of the aortic valve. No significant pericardial fluid. Mediastinum/Nodes: There are no enlarged mediastinal, hilar or axillary lymph nodes. The thyroid gland, trachea and esophagus demonstrate no significant findings. Lungs/Pleura: No pleural effusion or pneumothorax. Bandlike opacity extending superiorly from the right hilum, likely reflecting an area of scarring. Additional more linear scarring at both lung bases. No confluent airspace disease or suspicious pulmonary nodularity. Musculoskeletal/Chest wall: No chest wall mass or suspicious osseous findings. Multilevel spondylosis, most advanced in the cervical spine. There are scattered Schmorl's nodes, most prominent within the superior endplate of T11. CT ABDOMEN AND PELVIS FINDINGS Hepatobiliary: The liver is normal in density without suspicious focal abnormality. No evidence of gallstones, gallbladder wall thickening or biliary dilatation. Pancreas: Unremarkable. No pancreatic ductal dilatation or surrounding inflammatory changes. Spleen: Normal in size without focal abnormality. Adrenals/Urinary Tract: Both adrenal glands appear normal. Previously demonstrated cystic lesion in the interpolar region of the left kidney is no longer demonstrated. There is no evidence of urinary tract calculus or suspicious focal renal lesion. Mild chronic dilatation of the renal pelves and ureters without significantly delayed contrast excretion or perinephric soft tissue stranding. The bladder appears normal for its degree of distention. Stomach/Bowel: No enteric contrast administered. The stomach appears  unremarkable for its degree of distention. There is no small bowel distension, wall thickening or surrounding inflammation. The appendix is not as well seen currently, but there is no pericecal inflammation to suggest appendicitis. The colon is fluid-filled without significant distension. There is possible mild wall thickening at the splenic flexure with mild surrounding soft tissue stranding. The distal colon appears unremarkable. Vascular/Lymphatic: There are no enlarged abdominal or pelvic lymph nodes. Aortic and branch vessel atherosclerosis without evidence of aneurysm or large vessel occlusion. The renal veins, IVC, portal and superior mesenteric veins are patent. Reproductive: By history, the previously demonstrated large complex cystic mass in the right pelvis was surgically resected in the interval. There is a recurrent similar appearing mass, currently measures 13.9 x 7.8 cm on axial image 71/4 and 8.0 cm on coronal image 79/6 (previous measurements approximately 16.0 x 12.7 x 11.4 cm). This lesion remains largely cystic with a few mildly thickened septations. No left adnexal mass. Previous hysterectomy. Other: Intact abdominal wall. Trace ascites without focal extraluminal fluid collection or pneumoperitoneum. As above, possible soft tissue stranding around the splenic flexure of the colon with possible omental edema. No discrete peritoneal nodularity identified. Musculoskeletal: No acute or significant osseous findings. Chronic Schmorl's node in the superior endplate of L1. Review of the MIP images confirms the above findings.  IMPRESSION: 1. No evidence of acute pulmonary embolism or other acute vascular findings in the chest. 2. 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 Associated ascites and possible omental edema concerning for peritoneal spread of tumor. 3. Fluid-filled colon without significant distension,  suggesting diarrheal illness. Possible mild wall thickening and surrounding soft tissue stranding at the splenic flexure of the colon, suspicious for colitis versus peritoneal disease (as above). 4.  Aortic Atherosclerosis (ICD10-I70.0). Electronically Signed   By: Elsie Perone M.D.   On: 01/13/2024 16:53   CT ABDOMEN PELVIS W CONTRAST Result Date: 01/13/2024 CLINICAL DATA:  Pulmonary embolism (PE) suspected, high prob; Abdominal pain, acute, nonlocalized cancer hx ? - hypotension, abd pain Altered mental status with weakness, low abdominal pain and hypotension. 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 EXAM: CT ANGIOGRAPHY CHEST CT ABDOMEN AND PELVIS WITH CONTRAST TECHNIQUE: Multidetector CT imaging of the chest was performed using the standard protocol during bolus administration of intravenous contrast. Multiplanar CT image reconstructions and MIPs were obtained to evaluate the vascular anatomy. Multidetector CT imaging of the abdomen and pelvis was performed using the standard protocol during bolus administration of intravenous contrast. RADIATION DOSE REDUCTION: This exam was performed according to the departmental dose-optimization program which includes automated exposure control, adjustment of the mA and/or kV according to patient size and/or use of iterative reconstruction technique. CONTRAST:  OMNIPAQUE  IOHEXOL  350 MG/ML SOLN COMPARISON:  Chest radiographs 01/13/2024, abdominopelvic CT 05/03/2020. Pelvic ultrasound 05/02/2020. FINDINGS: CTA CHEST FINDINGS Cardiovascular: The pulmonary arteries are well opacified with contrast to the level of the segmental branches. There is no evidence of acute pulmonary embolism. Atherosclerosis of the aorta, great vessels and coronary arteries. Medially displaced retropharyngeal carotid arteries bilaterally. No acute systemic arterial abnormalities are identified. The heart is mildly  enlarged. There are calcifications of the aortic valve. No significant pericardial fluid. Mediastinum/Nodes: There are no enlarged mediastinal, hilar or axillary lymph nodes. The thyroid gland, trachea and esophagus demonstrate no significant findings. Lungs/Pleura: No pleural effusion or pneumothorax. Bandlike opacity extending superiorly from the right hilum, likely reflecting an area of scarring. Additional more linear scarring at both lung bases. No confluent airspace disease or suspicious pulmonary nodularity. Musculoskeletal/Chest wall: No chest wall mass or suspicious osseous findings. Multilevel spondylosis, most advanced in the cervical spine. There are scattered Schmorl's nodes, most prominent within the superior endplate of T11. CT ABDOMEN AND PELVIS FINDINGS Hepatobiliary: The liver is normal in density without suspicious focal abnormality. No evidence of gallstones, gallbladder wall thickening or biliary dilatation. Pancreas: Unremarkable. No pancreatic ductal dilatation or surrounding inflammatory changes. Spleen: Normal in size without focal abnormality. Adrenals/Urinary Tract: Both adrenal glands appear normal. Previously demonstrated cystic lesion in the interpolar region of the left kidney is no longer demonstrated. There is no evidence of urinary tract calculus or suspicious focal renal lesion. Mild chronic dilatation of the renal pelves and ureters without significantly delayed contrast excretion or perinephric soft tissue stranding. The bladder appears normal for its degree of distention. Stomach/Bowel: No enteric contrast administered. The stomach appears unremarkable for its degree of distention. There is no small bowel distension, wall thickening or surrounding inflammation. The appendix is not as well seen currently, but there is no pericecal inflammation to suggest appendicitis. The colon is fluid-filled without significant distension. There is possible mild wall thickening at the splenic  flexure with mild surrounding soft tissue stranding. The distal colon appears unremarkable. Vascular/Lymphatic: There  are no enlarged abdominal or pelvic lymph nodes. Aortic and branch vessel atherosclerosis without evidence of aneurysm or large vessel occlusion. The renal veins, IVC, portal and superior mesenteric veins are patent. Reproductive: By history, the previously demonstrated large complex cystic mass in the right pelvis was surgically resected in the interval. There is a recurrent similar appearing mass, currently measures 13.9 x 7.8 cm on axial image 71/4 and 8.0 cm on coronal image 79/6 (previous measurements approximately 16.0 x 12.7 x 11.4 cm). This lesion remains largely cystic with a few mildly thickened septations. No left adnexal mass. Previous hysterectomy. Other: Intact abdominal wall. Trace ascites without focal extraluminal fluid collection or pneumoperitoneum. As above, possible soft tissue stranding around the splenic flexure of the colon with possible omental edema. No discrete peritoneal nodularity identified. Musculoskeletal: No acute or significant osseous findings. Chronic Schmorl's node in the superior endplate of L1. Review of the MIP images confirms the above findings. IMPRESSION: 1. No evidence of acute pulmonary embolism or other acute vascular findings in the chest. 2. 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 Associated ascites and possible omental edema concerning for peritoneal spread of tumor. 3. Fluid-filled colon without significant distension, suggesting diarrheal illness. Possible mild wall thickening and surrounding soft tissue stranding at the splenic flexure of the colon, suspicious for colitis versus peritoneal disease (as above). 4.  Aortic Atherosclerosis (ICD10-I70.0). Electronically Signed   By: Elsie Perone M.D.   On: 01/13/2024 16:53   DG Chest Port 1 View Result Date:  01/13/2024 CLINICAL DATA:  Suspected sepsis. EXAM: PORTABLE CHEST 1 VIEW COMPARISON:  Chest radiograph dated 04/11/2022. FINDINGS: There is mild cardiomegaly with mild vascular congestion. No focal consolidation, pleural effusion, pneumothorax. A loop recorder device noted. No acute osseous pathology. IMPRESSION: Mild cardiomegaly with mild vascular congestion. No focal consolidation. Electronically Signed   By: Vanetta Chou M.D.   On: 01/13/2024 13:20   CUP PACEART REMOTE DEVICE CHECK Result Date: 12/30/2023 ILR summary report received. Battery status OK. Normal device function. No new symptom, tachy, brady, or pause episodes. No new AF episodes. Monthly summary reports and ROV/PRN. MC, [facility]   Microbiology: Results for orders placed or performed during the hospital encounter of 01/13/24  Resp panel by RT-PCR (RSV, Flu A&B, Covid) Anterior Nasal Swab     Status: None   Collection Time: 01/13/24 12:48 PM   Specimen: Anterior Nasal Swab  Result Value Ref Range Status   SARS Coronavirus 2 by RT PCR NEGATIVE NEGATIVE Final    Comment: (NOTE) SARS-CoV-2 target nucleic acids are NOT DETECTED.  The SARS-CoV-2 RNA is generally detectable in upper respiratory specimens during the acute phase of infection. The lowest concentration of SARS-CoV-2 viral copies this assay can detect is 138 copies/mL. A negative result does not preclude SARS-Cov-2 infection and should not be used as the sole basis for treatment or other patient management decisions. A negative result may occur with  improper specimen collection/handling, submission of specimen other than nasopharyngeal swab, presence of viral mutation(s) within the areas targeted by this assay, and inadequate number of viral copies(<138 copies/mL). A negative result must be combined with clinical observations, patient history, and epidemiological information. The expected result is Negative.  Fact Sheet for Patients:   BloggerCourse.com  Fact Sheet for Healthcare Providers:  SeriousBroker.it  This test is no t yet approved or cleared by the [place]  FDA and  has been authorized for detection and/or diagnosis of SARS-CoV-2 by FDA  under an Emergency Use Authorization (EUA). This EUA will remain  in effect (meaning this test can be used) for the duration of the COVID-19 declaration under Section 564(b)(1) of the Act, 21 U.S.C.section 360bbb-3(b)(1), unless the authorization is terminated  or revoked sooner.       Influenza A by PCR NEGATIVE NEGATIVE Final   Influenza B by PCR NEGATIVE NEGATIVE Final    Comment: (NOTE) The Xpert Xpress SARS-CoV-2/FLU/RSV plus assay is intended as an aid in the diagnosis of influenza from Nasopharyngeal swab specimens and should not be used as a sole basis for treatment. Nasal washings and aspirates are unacceptable for Xpert Xpress SARS-CoV-2/FLU/RSV testing.  Fact Sheet for Patients: BloggerCourse.com  Fact Sheet for Healthcare Providers: SeriousBroker.it  This test is not yet approved or cleared by the [place]  FDA and has been authorized for detection and/or diagnosis of SARS-CoV-2 by FDA under an Emergency Use Authorization (EUA). This EUA will remain in effect (meaning this test can be used) for the duration of the COVID-19 declaration under Section 564(b)(1) of the Act, 21 U.S.C. section 360bbb-3(b)(1), unless the authorization is terminated or revoked.     Resp Syncytial Virus by PCR NEGATIVE NEGATIVE Final    Comment: (NOTE) Fact Sheet for Patients: BloggerCourse.com  Fact Sheet for Healthcare Providers: SeriousBroker.it  This test is not yet approved or cleared by the [place]  FDA and has been authorized for detection and/or diagnosis of SARS-CoV-2 by FDA under an Emergency Use  Authorization (EUA). This EUA will remain in effect (meaning this test can be used) for the duration of the COVID-19 declaration under Section 564(b)(1) of the Act, 21 U.S.C. section 360bbb-3(b)(1), unless the authorization is terminated or revoked.  Performed at [facility], [address], [place], [place] [address]   Culture, blood (Routine x 2)     Status: None (Preliminary result)   Collection Time: 01/13/24 12:50 PM   Specimen: BLOOD  Result Value Ref Range Status   Specimen Description   Final    BLOOD RIGHT ANTECUBITAL Performed at [facility], [address] [place], [place] [address]    Special Requests   Final    BOTTLES DRAWN AEROBIC AND ANAEROBIC Blood Culture adequate volume Performed at [facility], [address], [place], [place] [address]    Culture  Setup Time   Final    GRAM POSITIVE COCCI AEROBIC BOTTLE ONLY Gram Stain Report Called to,Read Back By and Verified With: E WRIGHT,RN@1317  01/14/24 MK GRAM STAIN REVIEWED-AGREE WITH RESULT DRT CRITICAL RESULT CALLED TO, READ BACK BY AND VERIFIED WITH: RN EMILY WRIGHT ON 01/14/24 @ 1857 BY DRT Performed at [facility], [address], [place], [place] [address]    Culture GRAM POSITIVE COCCI IN CLUSTERS  Final   Report Status PENDING  Incomplete  Culture, blood (Routine x 2)     Status: None (Preliminary result)   Collection Time: 01/13/24 12:50 PM   Specimen: BLOOD  Result Value Ref Range Status   Specimen Description BLOOD LEFT ASSIST CONTROL  Final   Special Requests   Final    BOTTLES DRAWN AEROBIC AND ANAEROBIC Blood Culture adequate volume   Culture   Final    NO GROWTH 2 DAYS Performed at [facility], [address], [place], [place] [address]    Report Status PENDING  Incomplete  Blood Culture ID Panel (Reflexed)     Status: Abnormal   Collection Time: 01/13/24 12:50 PM  Result Value Ref Range Status  Enterococcus faecalis NOT DETECTED NOT DETECTED Final   Enterococcus Faecium NOT  DETECTED NOT DETECTED Final   Listeria monocytogenes NOT DETECTED NOT DETECTED Final   Staphylococcus species DETECTED (A) NOT DETECTED Final    Comment: CRITICAL RESULT CALLED TO, READ BACK BY AND VERIFIED WITH: RN EMILY WRIGHT ON 01/14/24 @ 1857 BY DRT    Staphylococcus aureus (BCID) NOT DETECTED NOT DETECTED Final   Staphylococcus epidermidis NOT DETECTED NOT DETECTED Final   Staphylococcus lugdunensis NOT DETECTED NOT DETECTED Final   Streptococcus species NOT DETECTED NOT DETECTED Final   Streptococcus agalactiae NOT DETECTED NOT DETECTED Final   Streptococcus pneumoniae NOT DETECTED NOT DETECTED Final   Streptococcus pyogenes NOT DETECTED NOT DETECTED Final   A.calcoaceticus-baumannii NOT DETECTED NOT DETECTED Final   Bacteroides fragilis NOT DETECTED NOT DETECTED Final   Enterobacterales NOT DETECTED NOT DETECTED Final   Enterobacter cloacae complex NOT DETECTED NOT DETECTED Final   Escherichia coli NOT DETECTED NOT DETECTED Final   Klebsiella aerogenes NOT DETECTED NOT DETECTED Final   Klebsiella oxytoca NOT DETECTED NOT DETECTED Final   Klebsiella pneumoniae NOT DETECTED NOT DETECTED Final   Proteus species NOT DETECTED NOT DETECTED Final   Salmonella species NOT DETECTED NOT DETECTED Final   Serratia marcescens NOT DETECTED NOT DETECTED Final   Haemophilus influenzae NOT DETECTED NOT DETECTED Final   Neisseria meningitidis NOT DETECTED NOT DETECTED Final   Pseudomonas aeruginosa NOT DETECTED NOT DETECTED Final   Stenotrophomonas maltophilia NOT DETECTED NOT DETECTED Final   Candida albicans NOT DETECTED NOT DETECTED Final   Candida auris NOT DETECTED NOT DETECTED Final   Candida glabrata NOT DETECTED NOT DETECTED Final   Candida krusei NOT DETECTED NOT DETECTED Final   Candida parapsilosis NOT DETECTED NOT DETECTED Final   Candida tropicalis NOT DETECTED NOT DETECTED Final   Cryptococcus neoformans/gattii NOT DETECTED NOT DETECTED Final    Comment: Performed at [facility], [address] [place], [place] [address]  C Difficile Quick Screen w PCR reflex     Status: None   Collection Time: 01/14/24  4:40 PM   Specimen: STOOL  Result Value Ref Range Status   C Diff antigen NEGATIVE NEGATIVE Final   C Diff toxin NEGATIVE NEGATIVE Final   C Diff interpretation No C. difficile detected.  Final    Comment: Performed at [facility], [address], [place], [place] [address]    Labs: CBC: Recent Labs  Lab 01/13/24 1346 01/14/24 0404 01/15/24 0408  WBC 10.6* 18.6* 9.3  NEUTROABS 9.2*  --   --   HGB 11.7* 10.7* 9.8*  HCT 36.6 31.7* 28.8*  MCV 87.4 83.6 83.2  PLT 268 267 226   Basic Metabolic Panel: Recent Labs  Lab 01/13/24 1346 01/14/24 0404 01/15/24 0408  NA 136 135 138  K 4.2 4.2 3.8  CL 109 108 111  CO2 18* 18* 21*  GLUCOSE 228* 244* 115*  BUN 15 15 8   CREATININE 1.16* 0.87 0.72  CALCIUM  8.2* 8.2* 8.2*  MG  --   --  1.8   Liver Function Tests: Recent Labs  Lab 01/13/24 1346  AST 22  ALT 16  ALKPHOS 111  BILITOT 1.0  PROT 6.4*  ALBUMIN 3.2*   CBG: Recent Labs  Lab 01/14/24 1114 01/14/24 1810 01/14/24 1944 01/14/24 2343 01/15/24 0510  GLUCAP 251* 243* 245* 135* 120*    Discharge time spent: greater than 30 minutes.  Signed: Adriana DELENA Grams, MD [facility] 01/15/2024

## 2024-01-16 ENCOUNTER — Telehealth: Payer: Self-pay

## 2024-01-16 LAB — GASTROINTESTINAL PANEL BY PCR, STOOL (REPLACES STOOL CULTURE)

## 2024-01-16 NOTE — Transitions of Care (Post Inpatient/ED Visit) (Signed)
   01/16/2024  Name: Renesha Lizama MRN: [id number] DOB: 1941/05/02  Today's TOC FU Call Status: Today's TOC FU Call Status:: Unsuccessful Call (1st Attempt) Unsuccessful Call (1st Attempt) Date: 01/16/24  Attempted to reach the patient regarding the most recent Inpatient/ED visit.  Follow Up Plan: Additional outreach attempts will be made to reach the patient to complete the Transitions of Care (Post Inpatient/ED visit) call.   Pegah Segel J. Mekhi Sonn RN, MSN [facility], [facility] RN Care Manager Direct Dial: [phone]  Fax: [phone] Website: [email or web address]

## 2024-01-16 NOTE — Transitions of Care (Post Inpatient/ED Visit) (Signed)
 01/16/2024  Name: Jean Watts MRN: [id number] DOB: 24-Aug-1940  Today's TOC FU Call Status: Today's TOC FU Call Status:: Successful TOC FU Call Completed TOC FU Call Complete Date: 01/16/24 Patient's Name and Date of Birth confirmed.  Transition Care Management Follow-up Telephone Call Date of Discharge: 01/15/24 [facility] Type of Discharge: Inpatient Admission Primary Inpatient Discharge Diagnosis:: Hypotension How have you been since you were released from the hospital?: Better Any questions or concerns?: No  Items Reviewed: Did you receive and understand the discharge instructions provided?: Yes Medications obtained,verified, and reconciled?: Yes (Medications Reviewed) (Antibiotics sent to mail order pharmacy. Call to patient local [facility] to get cipro  and flagyl  transfer[facility] from [facility].  Pharmacy has contacted [facility] and waiting on fax.  Daughter to call pharmacy as to status of medication.) Any new allergies since your discharge?: No Dietary orders reviewed?: Yes Type of Diet Ordered:: low sodium heart healthy Do you have support at home?: Yes People in Home [RPT]: child(ren), adult Name of Support/Comfort Primary Source: Avelina  Medications Reviewed Today: Medications Reviewed Today     Reviewed by Laasya Peyton, RN (Case Manager) on 01/16/24 at 1213  Med List Status: <None>   Medication Order Taking? Sig Documenting Provider Last Dose Status Informant  ACCU-CHEK AVIVA PLUS test strip [id number] Yes  [provider]  Active Child, Pharmacy Records  acetaminophen  (TYLENOL ) 500 MG tablet [id number] Yes Take 500 mg by mouth every 6 (six) hours as needed for moderate pain (pain score 4-6). [provider]  Active Child, Pharmacy Records  amitriptyline  (ELAVIL ) 50 MG tablet [id number] Yes Take 50 mg by mouth at bedtime.  [provider]  Active Child, Pharmacy Records  amLODipine -benazepril  (LOTREL) 5-10 MG capsule  [id number]  Take 1 capsule by mouth daily.  Patient not taking: Reported on 01/16/2024   Pearlean Manus, MD  Active Child, Pharmacy Records  aspirin  325 MG tablet [id number] Yes Take 1 tablet (325 mg total) by mouth daily with breakfast. Pearlean Manus, MD  Active Child, Pharmacy Records  atorvastatin  (LIPITOR) 20 MG tablet [id number] Yes Take 20 mg by mouth daily. [provider]  Active Child, Pharmacy Records  BD INSULIN  SYRINGE U/F 31G X 5/16 1 ML MISC [id number] Yes 2 (two) times daily. as directed [provider]  Active Child, Pharmacy Records  ciprofloxacin  (CIPRO ) 500 MG tablet [id number]  Take 1 tablet (500 mg total) by mouth 2 (two) times daily for 5 days. Willette Adriana LABOR, MD  Active   furosemide (LASIX) 20 MG tablet [id number] Yes Take 20 mg by mouth daily. [provider]  Active Child, Pharmacy Records  gabapentin  (NEURONTIN ) 100 MG capsule [id number]  Take 100-300 mg by mouth 3 (three) times daily. Take 1 capsule (100 mg) by mouth in the morning and midday, then take 3 capsules (300 mg) by mouth in the evening. [provider]  Active Child, Pharmacy Records  HUMULIN  N KWIKPEN 100 UNIT/ML KwikPen [id number] Yes Inject 20-40 Units into the skin 2 (two) times daily with a meal. Inject 40 units into the skin each morning and 20 units each evening. [provider]  Active Child, Pharmacy Records  lactobacillus acidophilus & bulgar (LACTINEX) chewable tablet [id number]  Chew 1 tablet by mouth 3 (three) times daily with meals for 10 days.  Patient not taking: Reported on 01/16/2024   Shahmehdi, Seyed A, MD  Active   metFORMIN (GLUCOPHAGE) 1000 MG tablet [id number] Yes Take 1,000 mg by mouth 2 (two) times daily.  [provider]  Active Child, Pharmacy Records  metoprolol  tartrate (LOPRESSOR ) 25 MG tablet [id number] Yes Take 25 mg by mouth 2 (two) times daily. [provider]  Active Child, Pharmacy Records           Med Note (WARD,  CHUCK KANDICE Kitchens Jan 13, 2024  5:13 PM)    metroNIDAZOLE  (FLAGYL ) 500 MG tablet [id number]  Take 1 tablet (500 mg total) by mouth 3 (three) times daily for 5 days. Willette Adriana LABOR, MD  Active   RESTASIS  0.05 % ophthalmic emulsion [id number] Yes Place 1 drop into both eyes 2 (two) times daily.  [provider]  Active Child, Pharmacy Records  Med List Note (Ward, Angelica, CPhT 01/13/24 1714): Pt gets meds dose packed at [facility] in [place] , pt's daughter assists with meds            Home Care and Equipment/Supplies: Were Home Health Services Ordered?: No Any new equipment or medical supplies ordered?: No  Functional Questionnaire: Do you need assistance with bathing/showering or dressing?: No Do you need assistance with meal preparation?: No Do you need assistance with eating?: No Do you have difficulty maintaining continence: Yes (wears pull ups) Do you need assistance with getting out of bed/getting out of a chair/moving?: Yes (daughter Avelina assists) Do you have difficulty managing or taking your medications?: Yes (pill packs)  Follow up appointments reviewed: PCP Follow-up appointment confirmed?: No (daughter Avelina to call) MD Provider Line Number:[phone] Given: No Specialist Hospital Follow-up appointment confirmed?: No Reason Specialist Follow-Up Not Confirmed:  (daughter to speak with PCP about referral to local GYN) Do you need transportation to your follow-up appointment?: No Do you understand care options if your condition(s) worsen?: Yes-patient verbalized understanding    Dillon Mcreynolds DOROTHA Seeds RN, MSN [facility]  [facility], [facility] Health RN Care Manager Direct Dial: [phone]  Fax: [phone] Website: [email or web address]

## 2024-01-16 NOTE — Patient Instructions (Addendum)
 Visit Information  Thank you for taking time to visit with me today. Please don't hesitate to contact me if I can be of assistance to you before our next scheduled telephone appointment.  Our next appointment is by telephone on 01/24/24 at 1000  Following is a copy of your care plan:   Goals Addressed             This Visit's Progress    VBCI Transitions of Care (TOC) Care Plan       Problems:  Recent Hospitalization for treatment of DMII and Hypotension and Colitis Knowledge Deficit Related to Hypotension and colitis  Goal:  Over the next 30 days, the patient will not experience hospital readmission  Interventions:  Transitions of Care: Doctor Visits  - discussed the importance of doctor visits Reviewed Signs and symptoms of infection Spoke with [facility] pharmacy to get flagyl  and Cipro  transferred from [facility] pharmacy.    Diabetes Interventions: Assessed patient's understanding of A1c goal: <8% Provided education to patient about basic DM disease process Discussed plans with patient for ongoing care management follow up and provided patient with direct contact information for care management team Review of patient status, including review of consultants reports, relevant laboratory and other test results, and medications completed Lab Results  Component Value Date   HGBA1C 8.6 (H) 01/14/2024  Reviewed importance of limiting carbs such as rice, potatoes, breads, and pastas. Also discussed limiting sweets and sugary drinks. COLITIS If you experience severe abdominal pain, fever, persistent diarrhea, bloody stools, or signs of dehydration, seek immediate medical attention. HYPOTENSION Drink more water . Fluids increase blood volume and help prevent dehydration, both of which are important in treating hypotension   Patient Self Care Activities:  Call provider office for new concerns or questions  Notify RN Care Manager of TOC call rescheduling needs Participate in  Transition of Care Program/Attend TOC scheduled calls Take medications as prescribed   check blood sugar at prescribed times: twice daily enter blood sugar readings and medication or insulin  into daily log take the blood sugar log to all doctor visits drink 6 to 8 glasses of water  each day wash and dry feet carefully every day wear comfortable, cotton socks  Plan:  The patient has been provided with contact information for the care management team and has been advised to call with any health related questions or concerns.       Call MD for: difficulty breathing, headache or visual disturbances Call MD for: persistant dizziness or light-headedness Call MD for: persistant nausea and vomiting Call MD for: severe uncontrolled pain Call MD for: temperature >100.4 Diet - low sodium heart healthy Discharge instructions Follow-up PCP in 1 week -Follow-up with OB/GYN Dr. Eloy in 1-2 weeks for large benign abdominal mass  The patient verbalized understanding of instructions, educational materials, and care plan provided today and DECLINED offer to receive copy of patient instructions, educational materials, and care plan.   The patient has been provided with contact information for the care management team and has been advised to call with any health related questions or concerns.   Please call the care guide team at [phone] if you need to cancel or reschedule your appointment.   Please call the Suicide and Crisis Lifeline: 988 if you are experiencing a Mental Health or Behavioral Health Crisis or need someone to talk to.  Jaqwan Wieber J. Kajol Crispen RN, MSN [facility], [facility] Health RN Care Manager Direct Dial: [phone]  Fax: [phone] Website:  [email or web address]

## 2024-01-18 LAB — CULTURE, BLOOD (ROUTINE X 2)
Culture: NO GROWTH
Special Requests: ADEQUATE

## 2024-01-24 ENCOUNTER — Other Ambulatory Visit: Payer: Self-pay

## 2024-01-24 NOTE — Transitions of Care (Post Inpatient/ED Visit) (Signed)
 Transition of Care week 2  Visit Note  01/24/2024  Name: Jean Watts MRN: [id number]          DOB: [birth year]  Situation: Patient enrolled in [facility] 30-day program. Visit completed with daughter Avelina by telephone.   Background:   Initial Transition Care Management Follow-up Telephone Call    Past Medical History:  Diagnosis Date   Arthritis    Dyspnea    Headache    History of dizziness    HLD (hyperlipidemia)    HTN (hypertension)    Neuropathy    Stroke (HCC)    no residual    Type 2 diabetes mellitus with diabetic polyneuropathy (HCC)    Visual loss    right eye    Assessment: Patient Reported Symptoms: Cognitive Cognitive Status: Alert and oriented to person, place, and time      Neurological Neurological Review of Symptoms: No symptoms reported    HEENT HEENT Symptoms Reported: No symptoms reported      Cardiovascular Cardiovascular Symptoms Reported: No symptoms reported    Respiratory Respiratory Symptoms Reported: No symptoms reported    Endocrine Endocrine Symptoms Reported: No symptoms reported Is patient diabetic?: Yes Is patient checking blood sugars at home?: Yes List most recent blood sugar readings, include date and time of day: Blood sugar 103 this am but daughter reports her A1c was 10.5 when [facility] nurse came by.  Physician increased insulin  to 40 uints in the am and 40 units in the PM. Reiterated importance of diet. Daughter verbalzed understanding. Endocrine Self-Management Outcome: 4 (good)  Gastrointestinal Gastrointestinal Symptoms Reported: Incontinence Gastrointestinal Management Strategies: Incontinence garment/pad Gastrointestinal Self-Management Outcome: 4 (good)    Genitourinary Genitourinary Symptoms Reported: Incontinence Genitourinary Management Strategies: Incontinence garment/pad  Integumentary Integumentary Symptoms Reported: No symptoms reported    Musculoskeletal Musculoskelatal Symptoms Reviewed: Weakness Additional  Musculoskeletal Details: Uses walker and cane. PT and OT active Musculoskeletal Management Strategies: Exercise      Psychosocial           There were no vitals filed for this visit.  Medications Reviewed Today     Reviewed by Ab Leaming, RN (Case Manager) on 01/24/24 at 1024  Med List Status: <None>   Medication Order Taking? Sig Documenting Provider Last Dose Status Informant  ACCU-CHEK AVIVA PLUS test strip [id number] Yes  [provider]  Active Child, Pharmacy Records  acetaminophen  (TYLENOL ) 500 MG tablet [id number] Yes Take 500 mg by mouth every 6 (six) hours as needed for moderate pain (pain score 4-6). [provider]  Active Child, Pharmacy Records  amitriptyline  (ELAVIL ) 50 MG tablet [id number] Yes Take 50 mg by mouth at bedtime.  [provider]  Active Child, Pharmacy Records  amLODipine -benazepril  (LOTREL) 5-10 MG capsule [id number] Yes Take 1 capsule by mouth daily. Pearlean Manus, MD  Active Child, Pharmacy Records  aspirin  325 MG tablet [id number] Yes Take 1 tablet (325 mg total) by mouth daily with breakfast. Pearlean Manus, MD  Active Child, Pharmacy Records  atorvastatin  (LIPITOR) 20 MG tablet [id number] Yes Take 20 mg by mouth daily. [provider]  Active Child, Pharmacy Records  BD INSULIN  SYRINGE U/F 31G X 5/16 1 ML MISC [id number] Yes 2 (two) times daily. as directed [provider]  Active Child, Pharmacy Records  furosemide (LASIX) 20 MG tablet [id number] Yes Take 20 mg by mouth daily. [provider]  Active Child, Pharmacy Records  gabapentin  (NEURONTIN ) 100 MG capsule [id number] Yes Take 100-300 mg by mouth 3 (three) times daily. Take 1  capsule (100 mg) by mouth in the morning and midday, then take 3 capsules (300 mg) by mouth in the evening. [provider]  Active Child, Pharmacy Records  HUMULIN  N KWIKPEN 100 UNIT/ML KwikPen [id number] Yes Inject 20-40 Units into the skin 2 (two) times daily with a  meal. Inject 40 units into the skin each morning and 20 units each evening.  Patient taking differently: Inject 20-40 Units into the skin 2 (two) times daily with a meal. Inject 40 units into the skin each morning and 40 units each evening.   [provider]  Active Child, Pharmacy Records  lactobacillus acidophilus & bulgar (LACTINEX) chewable tablet [id number]  Chew 1 tablet by mouth 3 (three) times daily with meals for 10 days.  Patient not taking: Reported on 01/24/2024   Shahmehdi, Seyed A, MD  Active   metFORMIN (GLUCOPHAGE) 1000 MG tablet [id number] Yes Take 1,000 mg by mouth 2 (two) times daily.  [provider]  Active Child, Pharmacy Records  metoprolol  tartrate (LOPRESSOR ) 25 MG tablet [id number] Yes Take 25 mg by mouth 2 (two) times daily. [provider]  Active Child, Pharmacy Records           Med Note DRENA, CHUCK KANDICE Kitchens Jan 13, 2024  5:13 PM)    RESTASIS  0.05 % ophthalmic emulsion [id number] Yes Place 1 drop into both eyes 2 (two) times daily.  [provider]  Active Child, Pharmacy Records  Med List Note (Ward, Angelica, CPhT 01/13/24 1714): Pt gets meds dose packed at [facility] in [place] , pt's daughter assists with meds            Goals Addressed             This Visit's Progress    VBCI Transitions of Care (TOC) Care Plan       Problems:  Recent Hospitalization for treatment of DMII and Hypotension and Colitis Knowledge Deficit Related to Hypotension and colitis Daughter needing contact information for Dr. Rossi-GYN  Goal:  Over the next 30 days, the patient will not experience hospital readmission  Interventions:  Transitions of Care: Doctor Visits  - discussed the importance of doctor visits Reiterated signs of infection.       Diabetes Interventions: Assessed patient's understanding of A1c goal: <8% Provided education to patient about basic DM disease process Discussed plans with patient for ongoing care management  follow up and provided patient with direct contact information for care management team Review of patient status, including review of consultants reports, relevant laboratory and other test results, and medications completed Lab Results  Component Value Date   HGBA1C 8.6 (H) 01/14/2024  Reviewed importance of limiting carbs such as rice, potatoes, breads, and pastas. Also discussed limiting sweets and sugary drinks. Discussed with daughter diabetes and most recent sugar of 103 this am.  Stressed importance of patient eating enough to sustain her sugars with increase of insulin  to 40 units in the morning and evening.  Daughter Avelina verbalized understanding.   COLITIS If you experience severe abdominal pain, fever, persistent diarrhea, bloody stools, or signs of dehydration, seek immediate medical attention. HYPOTENSION Drink more water . Fluids increase blood volume and help prevent dehydration, both of which are important in treating hypotension   Patient Self Care Activities:  Call provider office for new concerns or questions  Notify RN Care Manager of TOC call rescheduling needs Participate in Transition of Care Program/Attend TOC scheduled calls Take medications as prescribed   check blood sugar  at prescribed times: twice daily enter blood sugar readings and medication or insulin  into daily log take the blood sugar log to all doctor visits drink 6 to 8 glasses of water  each day wash and dry feet carefully every day wear comfortable, cotton socks Daughter reports patient did get flagyl  and cipro  last week.    Plan:  The patient has been provided with contact information for the care management team and has been advised to call with any health related questions or concerns.         Recommendation:   Continue Current Plan of Care  Follow Up Plan:   Telephone follow-up in 1 week   Tyjah Hai J. Quinci Gavidia RN, MSN [facility], [facility] Health RN Care  Manager Direct Dial: [phone]  Fax: [phone] Website: [email or web address]

## 2024-01-24 NOTE — Patient Instructions (Signed)
 Visit Information  Thank you for taking time to visit with me today. Please don't hesitate to contact me if I can be of assistance to you before our next scheduled telephone appointment.  Our next appointment is by telephone on 01/31/24 at 1000 am  Following is a copy of your care plan:   Goals Addressed             This Visit's Progress    VBCI Transitions of Care (TOC) Care Plan       Problems:  Recent Hospitalization for treatment of DMII and Hypotension and Colitis Knowledge Deficit Related to Hypotension and colitis Daughter needing contact information for Dr. Rossi-GYN  Goal:  Over the next 30 days, the patient will not experience hospital readmission  Interventions:  Transitions of Care: Doctor Visits  - discussed the importance of doctor visits Reiterated signs of infection.       Diabetes Interventions: Assessed patient's understanding of A1c goal: <8% Provided education to patient about basic DM disease process Discussed plans with patient for ongoing care management follow up and provided patient with direct contact information for care management team Review of patient status, including review of consultants reports, relevant laboratory and other test results, and medications completed Lab Results  Component Value Date   HGBA1C 8.6 (H) 01/14/2024  Reviewed importance of limiting carbs such as rice, potatoes, breads, and pastas. Also discussed limiting sweets and sugary drinks. Discussed with daughter diabetes and most recent sugar of 103 this am.  Stressed importance of patient eating enough to sustain her sugars with increase of insulin  to 40 units in the morning and evening.  Daughter Avelina verbalized understanding.   COLITIS If you experience severe abdominal pain, fever, persistent diarrhea, bloody stools, or signs of dehydration, seek immediate medical attention. HYPOTENSION Drink more water . Fluids increase blood volume and help prevent dehydration, both  of which are important in treating hypotension   Patient Self Care Activities:  Call provider office for new concerns or questions  Notify RN Care Manager of TOC call rescheduling needs Participate in Transition of Care Program/Attend TOC scheduled calls Take medications as prescribed   check blood sugar at prescribed times: twice daily enter blood sugar readings and medication or insulin  into daily log take the blood sugar log to all doctor visits drink 6 to 8 glasses of water  each day wash and dry feet carefully every day wear comfortable, cotton socks Daughter reports patient did get flagyl  and cipro  last week.    Plan:  The patient has been provided with contact information for the care management team and has been advised to call with any health related questions or concerns.         The patient verbalized understanding of instructions, educational materials, and care plan provided today and DECLINED offer to receive copy of patient instructions, educational materials, and care plan.   The patient has been provided with contact information for the care management team and has been advised to call with any health related questions or concerns.   Please call the care guide team at [phone] if you need to cancel or reschedule your appointment.   Please call the Suicide and Crisis Lifeline: 988 call the [place]  National Suicide Prevention Lifeline: 858-620-8831 or TTY: 703-290-7767 TTY (928)236-0373) to talk to a trained counselor if you are experiencing a Mental Health or Behavioral Health Crisis or need someone to talk to.  Nishat Livingston J. Jenilyn Magana RN, MSN   [facility], [facility] Health RN  Care Manager Direct Dial: [phone]  Fax: [phone] Website: [email or web address]

## 2024-01-30 ENCOUNTER — Ambulatory Visit

## 2024-01-30 DIAGNOSIS — I639 Cerebral infarction, unspecified: Secondary | ICD-10-CM | POA: Diagnosis not present

## 2024-01-30 LAB — CUP PACEART REMOTE DEVICE CHECK
Date Time Interrogation Session: 20250805231404
Implantable Pulse Generator Implant Date: 20210211

## 2024-01-31 ENCOUNTER — Other Ambulatory Visit: Payer: Self-pay

## 2024-01-31 NOTE — Transitions of Care (Post Inpatient/ED Visit) (Signed)
 Transition of Care week 3  Visit Note  01/31/2024  Name: Jean Watts MRN: [id number]          DOB: 06/03/1941  Situation: Patient enrolled in [facility] 30-day program. Visit completed with daughter Avelina by telephone.   Background:   Initial Transition Care Management Follow-up Telephone Call    Past Medical History:  Diagnosis Date   Arthritis    Dyspnea    Headache    History of dizziness    HLD (hyperlipidemia)    HTN (hypertension)    Neuropathy    Stroke (HCC)    no residual    Type 2 diabetes mellitus with diabetic polyneuropathy (HCC)    Visual loss    right eye    Assessment: Patient Reported Symptoms: Cognitive Cognitive Status: Alert and oriented to person, place, and time, Normal speech and language skills      Neurological Neurological Review of Symptoms: No symptoms reported    HEENT HEENT Symptoms Reported: No symptoms reported      Cardiovascular Cardiovascular Symptoms Reported: No symptoms reported    Respiratory Respiratory Symptoms Reported: No symptoms reported    Endocrine Endocrine Symptoms Reported: No symptoms reported Is patient checking blood sugars at home?: Yes List most recent blood sugar readings, include date and time of day: Bllod suger last check 237.  Reviewed diabetes management.  Avelina verbalized understanding.    Gastrointestinal Gastrointestinal Symptoms Reported: Incontinence Gastrointestinal Management Strategies: Incontinence garment/pad Gastrointestinal Self-Management Outcome: 4 (good)    Genitourinary Genitourinary Symptoms Reported: Incontinence Genitourinary Management Strategies: Incontinence garment/pad  Integumentary Integumentary Symptoms Reported: No symptoms reported    Musculoskeletal Musculoskelatal Symptoms Reviewed: Weakness Additional Musculoskeletal Details: Uses cane and walker.  Home health OT continues Musculoskeletal Management Strategies: Exercise, Routine screening      Psychosocial            There were no vitals filed for this visit.  Medications Reviewed Today     Reviewed by Jaystin Mcgarvey, RN (Case Manager) on 01/31/24 at 1033  Med List Status: <None>   Medication Order Taking? Sig Documenting Provider Last Dose Status Informant  ACCU-CHEK AVIVA PLUS test strip [id number] Yes  [provider]  Active Child, Pharmacy Records  acetaminophen  (TYLENOL ) 500 MG tablet [id number] Yes Take 500 mg by mouth every 6 (six) hours as needed for moderate pain (pain score 4-6). [provider]  Active Child, Pharmacy Records  amitriptyline  (ELAVIL ) 50 MG tablet [id number] Yes Take 50 mg by mouth at bedtime.  [provider]  Active Child, Pharmacy Records  amLODipine -benazepril  (LOTREL) 5-10 MG capsule [id number] Yes Take 1 capsule by mouth daily. Pearlean Manus, MD  Active Child, Pharmacy Records  aspirin  325 MG tablet [id number] Yes Take 1 tablet (325 mg total) by mouth daily with breakfast. Pearlean Manus, MD  Active Child, Pharmacy Records  atorvastatin  (LIPITOR) 20 MG tablet [id number] Yes Take 20 mg by mouth daily. [provider]  Active Child, Pharmacy Records  BD INSULIN  SYRINGE U/F 31G X 5/16 1 ML MISC [id number] Yes 2 (two) times daily. as directed [provider]  Active Child, Pharmacy Records  furosemide (LASIX) 20 MG tablet [id number] Yes Take 20 mg by mouth daily. [provider]  Active Child, Pharmacy Records  gabapentin  (NEURONTIN ) 100 MG capsule [id number] Yes Take 100-300 mg by mouth 3 (three) times daily. Take 1 capsule (100 mg) by mouth in the morning and midday, then take 3 capsules (300 mg) by mouth in the evening. [provider]  Active  Child, Pharmacy Records  HUMULIN  N KWIKPEN 100 UNIT/ML KwikPen [id number] Yes Inject 20-40 Units into the skin 2 (two) times daily with a meal. Inject 40 units into the skin each morning and 20 units each evening.  Patient taking differently: Inject 20-40 Units into the skin 2  (two) times daily with a meal. Inject 40 units into the skin each morning and 40 units each evening.   [provider]  Active Child, Pharmacy Records  metFORMIN (GLUCOPHAGE) 1000 MG tablet [id number] Yes Take 1,000 mg by mouth 2 (two) times daily.  [provider]  Active Child, Pharmacy Records  metoprolol  tartrate (LOPRESSOR ) 25 MG tablet [id number] Yes Take 25 mg by mouth 2 (two) times daily. [provider]  Active Child, Pharmacy Records           Med Note DRENA, CHUCK KANDICE Kitchens Jan 13, 2024  5:13 PM)    RESTASIS  0.05 % ophthalmic emulsion [id number] Yes Place 1 drop into both eyes 2 (two) times daily.  [provider]  Active Child, Pharmacy Records  Med List Note (Ward, Angelica, CPhT 01/13/24 1714): Pt gets meds dose packed at [facility] in [place] , pt's daughter assists with meds            Goals Addressed             This Visit's Progress    VBCI Transitions of Care (TOC) Care Plan       Problems:  Recent Hospitalization for treatment of DMII and Hypotension and Colitis Knowledge Deficit Related to Hypotension and colitis Appointment with Dr. Eloy 8-21 at [facility] per daughter.    Goal:  Over the next 30 days, the patient will not experience hospital readmission  Interventions:  Transitions of Care: Doctor Visits  - discussed the importance of doctor visits Discussed signs of infection.       Diabetes Interventions: Assessed patient's understanding of A1c goal: <8% Provided education to patient about basic DM disease process Discussed plans with patient for ongoing care management follow up and provided patient with direct contact information for care management team Review of patient status, including review of consultants reports, relevant laboratory and other test results, and medications completed Lab Results  Component Value Date   HGBA1C 8.6 (H) 01/14/2024  Reviewed importance of limiting carbs such as rice, potatoes, breads,  and pastas. Also discussed limiting sweets and sugary drinks. Discussed with daughter diabetes and most recent sugar of 237.  Discussed diabetes management.  Daughter Avelina verbalized understanding.   COLITIS If you experience severe abdominal pain, fever, persistent diarrhea, bloody stools, or signs of dehydration, seek immediate medical attention. HYPOTENSION Drink more water . Fluids increase blood volume and help prevent dehydration, both of which are important in treating hypotension   Patient Self Care Activities:  Call provider office for new concerns or questions  Notify RN Care Manager of TOC call rescheduling needs Participate in Transition of Care Program/Attend TOC scheduled calls Take medications as prescribed   check blood sugar at prescribed times: twice daily enter blood sugar readings and medication or insulin  into daily log take the blood sugar log to all doctor visits drink 6 to 8 glasses of water  each day wash and dry feet carefully every day wear comfortable, cotton socks     Plan:  The patient has been provided with contact information for the care management team and has been advised to call with any health related questions or concerns.  Recommendation:   Continue Current Plan of Care  Follow Up Plan:   Telephone follow-up in 1 week  Kayzen Kendzierski J. Teylor Wolven RN, MSN [facility], [facility] RN Care Manager Direct Dial: [phone]  Fax: [phone] Website: [email or web address]

## 2024-01-31 NOTE — Patient Instructions (Signed)
 Visit Information  Thank you for taking time to visit with me today. Please don't hesitate to contact me if I can be of assistance to you before our next scheduled telephone appointment.  Our next appointment is by telephone on 02/07/24 at 1000 am  Following is a copy of your care plan:   Goals Addressed             This Visit's Progress    VBCI Transitions of Care (TOC) Care Plan       Problems:  Recent Hospitalization for treatment of DMII and Hypotension and Colitis Knowledge Deficit Related to Hypotension and colitis Appointment with Dr. Eloy 8-21 at [facility] per daughter.    Goal:  Over the next 30 days, the patient will not experience hospital readmission  Interventions:  Transitions of Care: Doctor Visits  - discussed the importance of doctor visits Discussed signs of infection.       Diabetes Interventions: Assessed patient's understanding of A1c goal: <8% Provided education to patient about basic DM disease process Discussed plans with patient for ongoing care management follow up and provided patient with direct contact information for care management team Review of patient status, including review of consultants reports, relevant laboratory and other test results, and medications completed Lab Results  Component Value Date   HGBA1C 8.6 (H) 01/14/2024  Reviewed importance of limiting carbs such as rice, potatoes, breads, and pastas. Also discussed limiting sweets and sugary drinks. Discussed with daughter diabetes and most recent sugar of 237.  Discussed diabetes management.  Daughter Avelina verbalized understanding.   COLITIS If you experience severe abdominal pain, fever, persistent diarrhea, bloody stools, or signs of dehydration, seek immediate medical attention. HYPOTENSION Drink more water . Fluids increase blood volume and help prevent dehydration, both of which are important in treating hypotension   Patient Self Care Activities:  Call provider office  for new concerns or questions  Notify RN Care Manager of TOC call rescheduling needs Participate in Transition of Care Program/Attend TOC scheduled calls Take medications as prescribed   check blood sugar at prescribed times: twice daily enter blood sugar readings and medication or insulin  into daily log take the blood sugar log to all doctor visits drink 6 to 8 glasses of water  each day wash and dry feet carefully every day wear comfortable, cotton socks     Plan:  The patient has been provided with contact information for the care management team and has been advised to call with any health related questions or concerns.         The patient verbalized understanding of instructions, educational materials, and care plan provided today and DECLINED offer to receive copy of patient instructions, educational materials, and care plan.   The patient has been provided with contact information for the care management team and has been advised to call with any health related questions or concerns.   Please call the care guide team at [phone] if you need to cancel or reschedule your appointment.   Please call the Suicide and Crisis Lifeline: 988 if you are experiencing a Mental Health or Behavioral Health Crisis or need someone to talk to.  Ruslan Mccabe J. Nyree Yonker RN, MSN [facility], [facility] RN Care Manager Direct Dial: [phone]  Fax: [phone] Website: [email or web address]

## 2024-02-04 ENCOUNTER — Ambulatory Visit: Payer: Self-pay | Admitting: Cardiovascular Disease

## 2024-02-07 ENCOUNTER — Other Ambulatory Visit: Payer: Self-pay

## 2024-02-07 NOTE — Transitions of Care (Post Inpatient/ED Visit) (Signed)
 Transition of Care week 4  Visit Note  02/07/2024  Name: Jean Watts MRN: [id number]          DOB: [birth year]  Situation: Patient enrolled in [facility] 30-day program. Visit completed with daughter Jean Watts by telephone.   Background:   Initial Transition Care Management Follow-up Telephone Call    Past Medical History:  Diagnosis Date   Arthritis    Dyspnea    Headache    History of dizziness    HLD (hyperlipidemia)    HTN (hypertension)    Neuropathy    Stroke (HCC)    no residual    Type 2 diabetes mellitus with diabetic polyneuropathy (HCC)    Visual loss    right eye    Assessment: Patient Reported Symptoms: Cognitive Cognitive Status: Alert and oriented to person, place, and time, Normal speech and language skills      Neurological Neurological Review of Symptoms: No symptoms reported    HEENT HEENT Symptoms Reported: No symptoms reported      Cardiovascular Cardiovascular Symptoms Reported: No symptoms reported    Respiratory Respiratory Symptoms Reported: No symptoms reported    Endocrine Endocrine Symptoms Reported: No symptoms reported Is patient diabetic?: Yes Is patient checking blood sugars at home?: Yes List most recent blood sugar readings, include date and time of day: Blood sugar last check 141    Gastrointestinal Gastrointestinal Symptoms Reported: Incontinence Gastrointestinal Management Strategies: Incontinence garment/pad Gastrointestinal Self-Management Outcome: 4 (good)    Genitourinary Genitourinary Symptoms Reported: Incontinence Genitourinary Management Strategies: Incontinence garment/pad  Integumentary Integumentary Symptoms Reported: No symptoms reported    Musculoskeletal Musculoskelatal Symptoms Reviewed: Weakness Additional Musculoskeletal Details: Uses cane and walker. Home health therpay continues Musculoskeletal Management Strategies: Exercise, Routine screening Musculoskeletal Self-Management Outcome: 4 (good)       Psychosocial           Vitals:   02/07/24 1051  BP: 131/87    Medications Reviewed Today     Reviewed by Brandin Dilday, RN (Case Manager) on 02/07/24 at 1049  Med List Status: <None>   Medication Order Taking? Sig Documenting Provider Last Dose Status Informant  ACCU-CHEK AVIVA PLUS test strip [id number] Yes  [provider]  Active Child, Pharmacy Records  acetaminophen  (TYLENOL ) 500 MG tablet [id number] Yes Take 500 mg by mouth every 6 (six) hours as needed for moderate pain (pain score 4-6). [provider]  Active Child, Pharmacy Records  amitriptyline  (ELAVIL ) 50 MG tablet [id number] Yes Take 50 mg by mouth at bedtime.  [provider]  Active Child, Pharmacy Records  amLODipine -benazepril  (LOTREL) 5-10 MG capsule [id number] Yes Take 1 capsule by mouth daily. Pearlean Manus, MD  Active Child, Pharmacy Records  aspirin  325 MG tablet [id number] Yes Take 1 tablet (325 mg total) by mouth daily with breakfast. Pearlean Manus, MD  Active Child, Pharmacy Records  atorvastatin  (LIPITOR) 20 MG tablet [id number] Yes Take 20 mg by mouth daily. [provider]  Active Child, Pharmacy Records  BD INSULIN  SYRINGE U/F 31G X 5/16 1 ML MISC [id number] Yes 2 (two) times daily. as directed [provider]  Active Child, Pharmacy Records  furosemide (LASIX) 20 MG tablet [id number] Yes Take 20 mg by mouth daily. [provider]  Active Child, Pharmacy Records  gabapentin  (NEURONTIN ) 100 MG capsule [id number] Yes Take 100-300 mg by mouth 3 (three) times daily. Take 1 capsule (100 mg) by mouth in the morning and midday, then take 3 capsules (300 mg) by mouth in the evening. [provider]  Active Child, Pharmacy Records  HUMULIN  N KWIKPEN 100 UNIT/ML KwikPen [id number] Yes Inject 20-40 Units into the skin 2 (two) times daily with a meal. Inject 40 units into the skin each morning and 20 units each evening.  Patient taking differently: Inject 20-40  Units into the skin 2 (two) times daily with a meal. Inject 40 units into the skin each morning and 40 units each evening.   [provider]  Active Child, Pharmacy Records  metFORMIN (GLUCOPHAGE) 1000 MG tablet [id number] Yes Take 1,000 mg by mouth 2 (two) times daily.  [provider]  Active Child, Pharmacy Records  metoprolol  tartrate (LOPRESSOR ) 25 MG tablet [id number] Yes Take 25 mg by mouth 2 (two) times daily. [provider]  Active Child, Pharmacy Records           Med Note DRENA, CHUCK KANDICE Kitchens Jan 13, 2024  5:13 PM)    RESTASIS  0.05 % ophthalmic emulsion [id number] Yes Place 1 drop into both eyes 2 (two) times daily.  [provider]  Active Child, Pharmacy Records  Med List Note (Ward, Angelica, CPhT 01/13/24 1714): Pt gets meds dose packed at [facility] in [place] , pt's daughter assists with meds            Goals Addressed             This Visit's Progress    VBCI Transitions of Care (TOC) Care Plan       Problems:  Recent Hospitalization for treatment of DMII and Hypotension and Colitis Knowledge Deficit Related to Hypotension and colitis Appointment with Dr. Eloy 8-21 at [facility] per daughter.    Goal:  Over the next 30 days, the patient will not experience hospital readmission  Interventions:  Transitions of Care: Doctor Visits  - discussed the importance of doctor visits Discussed signs of infection.       Diabetes Interventions: Assessed patient's understanding of A1c goal: <8% Provided education to patient about basic DM disease process Discussed plans with patient for ongoing care management follow up and provided patient with direct contact information for care management team Review of patient status, including review of consultants reports, relevant laboratory and other test results, and medications completed Lab Results  Component Value Date   HGBA1C 8.6 (H) 01/14/2024  Reviewed importance of limiting carbs such as  rice, potatoes, breads, and pastas. Also discussed limiting sweets and sugary drinks. Discussed with daughter diabetes and most recent sugar of 141.  Discussed diabetes management.  Daughter Jean Watts verbalized understanding.   COLITIS If you experience severe abdominal pain, fever, persistent diarrhea, bloody stools, or signs of dehydration, seek immediate medical attention. HYPOTENSION Drink more water . Fluids increase blood volume and help prevent dehydration, both of which are important in treating hypotension   Patient Self Care Activities:  Call provider office for new concerns or questions  Notify RN Care Manager of TOC call rescheduling needs Participate in Transition of Care Program/Attend TOC scheduled calls Take medications as prescribed   check blood sugar at prescribed times: twice daily enter blood sugar readings and medication or insulin  into daily log take the blood sugar log to all doctor visits drink 6 to 8 glasses of water  each day wash and dry feet carefully every day wear comfortable, cotton socks     Plan:  The patient has been provided with contact information for the care management team and has been advised to call with any health related questions or concerns.  Recommendation:   Continue Current Plan of Care  Follow Up Plan:   Telephone follow-up in 1 week  Zooey Schreurs J. Aaban Griep RN, MSN [facility], [facility] RN Care Manager Direct Dial: [phone]  Fax: [phone] Website: [email or web address]

## 2024-02-07 NOTE — Patient Instructions (Signed)
 Visit Information  Thank you for taking time to visit with me today. Please don't hesitate to contact me if I can be of assistance to you before our next scheduled telephone appointment.  Our next appointment is by telephone on 02/14/24 at 1000 am  Following is a copy of your care plan:   Goals Addressed             This Visit's Progress    VBCI Transitions of Care (TOC) Care Plan       Problems:  Recent Hospitalization for treatment of DMII and Hypotension and Colitis Knowledge Deficit Related to Hypotension and colitis Appointment with Dr. Eloy 8-21 at [facility] per daughter.    Goal:  Over the next 30 days, the patient will not experience hospital readmission  Interventions:  Transitions of Care: Doctor Visits  - discussed the importance of doctor visits Discussed signs of infection.       Diabetes Interventions: Assessed patient's understanding of A1c goal: <8% Provided education to patient about basic DM disease process Discussed plans with patient for ongoing care management follow up and provided patient with direct contact information for care management team Review of patient status, including review of consultants reports, relevant laboratory and other test results, and medications completed Lab Results  Component Value Date   HGBA1C 8.6 (H) 01/14/2024  Reviewed importance of limiting carbs such as rice, potatoes, breads, and pastas. Also discussed limiting sweets and sugary drinks. Discussed with daughter diabetes and most recent sugar of 141.  Discussed diabetes management.  Daughter Avelina verbalized understanding.   COLITIS If you experience severe abdominal pain, fever, persistent diarrhea, bloody stools, or signs of dehydration, seek immediate medical attention. HYPOTENSION Drink more water . Fluids increase blood volume and help prevent dehydration, both of which are important in treating hypotension   Patient Self Care Activities:  Call provider office  for new concerns or questions  Notify RN Care Manager of TOC call rescheduling needs Participate in Transition of Care Program/Attend TOC scheduled calls Take medications as prescribed   check blood sugar at prescribed times: twice daily enter blood sugar readings and medication or insulin  into daily log take the blood sugar log to all doctor visits drink 6 to 8 glasses of water  each day wash and dry feet carefully every day wear comfortable, cotton socks     Plan:  The patient has been provided with contact information for the care management team and has been advised to call with any health related questions or concerns.         The patient verbalized understanding of instructions, educational materials, and care plan provided today and DECLINED offer to receive copy of patient instructions, educational materials, and care plan.   The patient has been provided with contact information for the care management team and has been advised to call with any health related questions or concerns.   Please call the care guide team at [phone] if you need to cancel or reschedule your appointment.   Please call the Suicide and Crisis Lifeline: 988 if you are experiencing a Mental Health or Behavioral Health Crisis or need someone to talk to.  Philipe Laswell J. Murat Rideout RN, MSN [facility], [facility] RN Care Manager Direct Dial: [phone]  Fax: [phone] Website: [email or web address]

## 2024-02-13 ENCOUNTER — Encounter (HOSPITAL_COMMUNITY): Payer: Self-pay | Admitting: Obstetrics and Gynecology

## 2024-02-14 ENCOUNTER — Other Ambulatory Visit: Payer: Self-pay

## 2024-02-14 ENCOUNTER — Other Ambulatory Visit (HOSPITAL_COMMUNITY): Payer: Self-pay | Admitting: Obstetrics and Gynecology

## 2024-02-14 DIAGNOSIS — R1900 Intra-abdominal and pelvic swelling, mass and lump, unspecified site: Secondary | ICD-10-CM

## 2024-02-14 DIAGNOSIS — E1142 Type 2 diabetes mellitus with diabetic polyneuropathy: Secondary | ICD-10-CM

## 2024-02-14 DIAGNOSIS — E861 Hypovolemia: Secondary | ICD-10-CM

## 2024-02-14 NOTE — Patient Instructions (Signed)
 Visit Information  Thank you for taking time to visit with me today. Please don't hesitate to contact me if I can be of assistance to you.  Following is a copy of your care plan:   Goals Addressed             This Visit's Progress    COMPLETED: VBCI Transitions of Care (TOC) Care Plan       Problems:  Recent Hospitalization for treatment of DMII and Hypotension and Colitis Knowledge Deficit Related to Hypotension and colitis Patient seen by Dr. Eloy 8-21 at [facility] per daughter.  They examined patient, no changes but decision made due to age Pelvis Mass would not be removed.  Daughter reports upcoming appointment to draw fluid off patient abdomen in [place].  She does not have the date with her.      Goal:  Over the next 30 days, the patient will not experience hospital readmission  Interventions:  Transitions of Care: Doctor Visits  - discussed the importance of doctor visits Referral to Longitudinal Nurse Case Manager for Ongoing follow-up Discussed signs of infection.       Diabetes Interventions: Assessed patient's understanding of A1c goal: <8% Provided education to patient about basic DM disease process Discussed plans with patient for ongoing care management follow up and provided patient with direct contact information for care management team Review of patient status, including review of consultants reports, relevant laboratory and other test results, and medications completed Lab Results  Component Value Date   HGBA1C 8.6 (H) 01/14/2024  Reviewed importance of limiting carbs such as rice, potatoes, breads, and pastas. Also discussed limiting sweets and sugary drinks. Discussed with daughter diabetes and sugars running in the 100's.  Discussed diabetes management.  Daughter Avelina verbalized understanding.   COLITIS If you experience severe abdominal pain, fever, persistent diarrhea, bloody stools, or signs of dehydration, seek immediate medical  attention. HYPOTENSION Drink more water . Fluids increase blood volume and help prevent dehydration, both of which are important in treating hypotension   Patient Self Care Activities:  Call provider office for new concerns or questions  Take medications as prescribed   check blood sugar at prescribed times: twice daily enter blood sugar readings and medication or insulin  into daily log take the blood sugar log to all doctor visits drink 6 to 8 glasses of water  each day wash and dry feet carefully every day wear comfortable, cotton socks Monitor blood pressure     Plan:  The patient has been provided with contact information for the care management team and has been advised to call with any health related questions or concerns.         The patient verbalized understanding of instructions, educational materials, and care plan provided today and DECLINED offer to receive copy of patient instructions, educational materials, and care plan.   The patient has been provided with contact information for the care management team and has been advised to call with any health related questions or concerns.   Please call the care guide team at [phone] if you need to cancel or reschedule your appointment.   Please call the Suicide and Crisis Lifeline: 988 if you are experiencing a Mental Health or Behavioral Health Crisis or need someone to talk to.  Chevon Laufer J. Faye Sanfilippo RN, MSN [facility], [facility] RN Care Manager Direct Dial: [phone]  Fax: [phone] Website: [email or web address]

## 2024-02-14 NOTE — Transitions of Care (Post Inpatient/ED Visit) (Signed)
 Transition of Care week 5  Visit Note  02/14/2024  Name: Jean Watts MRN: [id number]          DOB: [birth year]  Situation: Patient enrolled in [facility] 30-day program. Visit completed with daughter Avelina by telephone.   Background:   Initial Transition Care Management Follow-up Telephone Call    Past Medical History:  Diagnosis Date   Arthritis    Dyspnea    Headache    History of dizziness    HLD (hyperlipidemia)    HTN (hypertension)    Neuropathy    Stroke (HCC)    no residual    Type 2 diabetes mellitus with diabetic polyneuropathy (HCC)    Visual loss    right eye    Assessment: Patient Reported Symptoms: Cognitive Cognitive Status: Alert and oriented to person, place, and time, Normal speech and language skills      Neurological Neurological Review of Symptoms: No symptoms reported    HEENT HEENT Symptoms Reported: No symptoms reported      Cardiovascular Cardiovascular Symptoms Reported: No symptoms reported    Respiratory Respiratory Symptoms Reported: No symptoms reported    Endocrine Endocrine Symptoms Reported: No symptoms reported List most recent blood sugar readings, include date and time of day: Sugars running in the 100's Endocrine Self-Management Outcome: 4 (good)  Gastrointestinal Gastrointestinal Symptoms Reported: Incontinence Gastrointestinal Management Strategies: Incontinence garment/pad    Genitourinary Genitourinary Symptoms Reported: Incontinence Genitourinary Management Strategies: Incontinence garment/pad  Integumentary Integumentary Symptoms Reported: No symptoms reported    Musculoskeletal Musculoskelatal Symptoms Reviewed: Weakness Additional Musculoskeletal Details: Home Health Active, uses cane and walker Musculoskeletal Management Strategies: Exercise, Routine screening Musculoskeletal Self-Management Outcome: 4 (good) Falls in the past year?: No    Psychosocial           There were no vitals filed for this  visit.  Medications Reviewed Today     Reviewed by Jnae Thomaston, RN (Case Manager) on 02/14/24 at 1113  Med List Status: <None>   Medication Order Taking? Sig Documenting Provider Last Dose Status Informant  ACCU-CHEK AVIVA PLUS test strip [id number] Yes  [provider]  Active Child, Pharmacy Records  acetaminophen  (TYLENOL ) 500 MG tablet [id number] Yes Take 500 mg by mouth every 6 (six) hours as needed for moderate pain (pain score 4-6). [provider]  Active Child, Pharmacy Records  amitriptyline  (ELAVIL ) 50 MG tablet [id number] Yes Take 50 mg by mouth at bedtime.  [provider]  Active Child, Pharmacy Records  amLODipine -benazepril  (LOTREL) 5-10 MG capsule [id number] Yes Take 1 capsule by mouth daily. Pearlean Manus, MD  Active Child, Pharmacy Records  aspirin  325 MG tablet [id number] Yes Take 1 tablet (325 mg total) by mouth daily with breakfast. Pearlean Manus, MD  Active Child, Pharmacy Records  atorvastatin  (LIPITOR) 20 MG tablet [id number] Yes Take 20 mg by mouth daily. [provider]  Active Child, Pharmacy Records  BD INSULIN  SYRINGE U/F 31G X 5/16 1 ML MISC [id number] Yes 2 (two) times daily. as directed [provider]  Active Child, Pharmacy Records  furosemide (LASIX) 20 MG tablet [id number] Yes Take 20 mg by mouth daily. [provider]  Active Child, Pharmacy Records  gabapentin  (NEURONTIN ) 100 MG capsule [id number] Yes Take 100-300 mg by mouth 3 (three) times daily. Take 1 capsule (100 mg) by mouth in the morning and midday, then take 3 capsules (300 mg) by mouth in the evening. [provider]  Active Child, Pharmacy Records  HUMULIN  N KWIKPEN 100 UNIT/ML KwikPen [id number]  Yes Inject 20-40 Units into the skin 2 (two) times daily with a meal. Inject 40 units into the skin each morning and 20 units each evening.  Patient taking differently: Inject 20-40 Units into the skin 2 (two) times daily with a meal. Inject 40  units into the skin each morning and 40 units each evening.   [provider]  Active Child, Pharmacy Records  metFORMIN (GLUCOPHAGE) 1000 MG tablet [id number] Yes Take 1,000 mg by mouth 2 (two) times daily.  [provider]  Active Child, Pharmacy Records  metoprolol  tartrate (LOPRESSOR ) 25 MG tablet [id number] Yes Take 25 mg by mouth 2 (two) times daily. [provider]  Active Child, Pharmacy Records           Med Note DRENA, CHUCK KANDICE Kitchens Jan 13, 2024  5:13 PM)    RESTASIS  0.05 % ophthalmic emulsion [id number] Yes Place 1 drop into both eyes 2 (two) times daily.  [provider]  Active Child, Pharmacy Records  Med List Note (Ward, Angelica, CPhT 01/13/24 1714): Pt gets meds dose packed at [facility] in [place] , pt's daughter assists with meds            Goals Addressed             This Visit's Progress    COMPLETED: VBCI Transitions of Care (TOC) Care Plan       Problems:  Recent Hospitalization for treatment of DMII and Hypotension and Colitis Knowledge Deficit Related to Hypotension and colitis Patient seen by Dr. Eloy 8-21 at [facility] per daughter.  They examined patient, no changes but decision made due to age Pelvis Mass would not be removed.  Daughter reports upcoming appointment to draw fluid off patient abdomen in [place].  She does not have the date with her.      Goal:  Over the next 30 days, the patient will not experience hospital readmission  Interventions:  Transitions of Care: Doctor Visits  - discussed the importance of doctor visits Referral to Longitudinal Nurse Case Manager for Ongoing follow-up Discussed signs of infection.       Diabetes Interventions: Assessed patient's understanding of A1c goal: <8% Provided education to patient about basic DM disease process Discussed plans with patient for ongoing care management follow up and provided patient with direct contact information for care management team Review of  patient status, including review of consultants reports, relevant laboratory and other test results, and medications completed Lab Results  Component Value Date   HGBA1C 8.6 (H) 01/14/2024  Reviewed importance of limiting carbs such as rice, potatoes, breads, and pastas. Also discussed limiting sweets and sugary drinks. Discussed with daughter diabetes and sugars running in the 100's.  Discussed diabetes management.  Daughter Avelina verbalized understanding.   COLITIS If you experience severe abdominal pain, fever, persistent diarrhea, bloody stools, or signs of dehydration, seek immediate medical attention. HYPOTENSION Drink more water . Fluids increase blood volume and help prevent dehydration, both of which are important in treating hypotension   Patient Self Care Activities:  Call provider office for new concerns or questions  Take medications as prescribed   check blood sugar at prescribed times: twice daily enter blood sugar readings and medication or insulin  into daily log take the blood sugar log to all doctor visits drink 6 to 8 glasses of water  each day wash and dry feet carefully every day wear comfortable, cotton socks Monitor blood pressure     Plan:  The patient has been provided  with contact information for the care management team and has been advised to call with any health related questions or concerns.         Recommendation:   Referral to: CCM  Follow Up Plan:   Referral to RN Case Manager Closing From:  Transitions of Care Program  Jearline Hirschhorn DOROTHA Seeds RN, MSN [place]  [facility], [facility] RN Care Manager Direct Dial: [phone]  Fax: [phone] Website: [email or web address]

## 2024-02-14 NOTE — Progress Notes (Unsigned)
 Jenna Cordella LABOR, MD  Baldwin Rosella L No solid component worth targeting for biopsy.  IR could potentially aspirate the cystic mass and send fluid for lab evaluation/cytology, but not sure if that is what they want.  I would recommend cancelling this request and if they want aspiration, then place a new request.       Previous Messages    ----- Message ----- From: Baldwin Rosella CROME Sent: 02/14/2024   9:15 AM EDT To: Rosella CROME Baldwin; Taryn F Rigney, RT; Ir Proc* Subject: CT BIOPSY                                      Procedure :CT BIOPSY  Reason :Large pelvic cyst, Dx: Pelvic mass in female   History :CT ABDOMEN PELVIS W CONTRAST,CT Angio Chest PE W and/or Wo Contrast,DG Chest [place] 1 View,DG Abd Portable 1 View  Provider: Kristene Laymon Dragon, MD  Provider contact ;  [phone]

## 2024-02-17 ENCOUNTER — Telehealth (HOSPITAL_COMMUNITY): Payer: Self-pay

## 2024-02-17 ENCOUNTER — Other Ambulatory Visit (HOSPITAL_COMMUNITY): Payer: Self-pay | Admitting: Obstetrics and Gynecology

## 2024-02-17 ENCOUNTER — Telehealth: Payer: Self-pay

## 2024-02-17 DIAGNOSIS — R1900 Intra-abdominal and pelvic swelling, mass and lump, unspecified site: Secondary | ICD-10-CM

## 2024-02-17 NOTE — Progress Notes (Unsigned)
 Carolee Rosina JONETTA Baldwin Channing LITTIE      Previous Messages    ----- Message ----- From: Jenna Cordella LABOR, MD Sent: 02/17/2024   3:37 PM EDT To: Rosina JONETTA Carolee Subject: RE: IR aspiration                              CT only. ----- Message ----- From: Carolee Rosina JONETTA Sent: 02/17/2024   3:23 PM EDT To: Cordella LABOR Jenna, MD Subject: RE: IR aspiration                              Jenna,  Just want to clarify. The last review we had from you mentioned doing this in IR if the office wanted and to resend for review. This request mentions to do in CT. Which modality do you feel is best?  Thanks, Ash ----- Message ----- From: Jenna Cordella LABOR, MD Sent: 02/17/2024   2:35 PM EDT To: Rosina JONETTA Carolee Subject: RE: IR aspiration                              PROCEDURE / BIOPSY REVIEW Date: 02/17/24  Requested Biopsy site: Right pelvic cyst aspiration Reason for request: Hx of malig.  Not surgical candidate.  Plan for right cyst aspiration with cytology of fluid Imaging review: Best seen on CT  Decision: Approved Imaging modality to perform: CT Schedule with: Moderate Sedation Schedule for: Any VIR  Additional comments: @Schedulers .  Please contact me with questions, concerns, or if issue pertaining to this request arise.  Cordella LABOR Jenna, MD [facility] ----- Message ----- From: Carolee Rosina JONETTA Sent: 02/17/2024   1:54 PM EDT To: Ir Procedure Requests Subject: IR aspiration                                  Procedure: IR aspiration of cystic mass  Dx: Pelvic mass in female  Ordering: Suzen Oar, PA [phone]  Imaging: CT abd/pelvis in epic  Please review.  Thanks, Erwin

## 2024-02-17 NOTE — Telephone Encounter (Signed)
-----   Message from Cordella DELENA Banner sent at 02/17/2024  3:37 PM EDT ----- Regarding: RE: IR aspiration CT only. ----- Message ----- From: Carolee Rosina BIRCH Sent: 02/17/2024   3:23 PM EDT To: Cordella DELENA Banner, MD Subject: RE: IR aspiration                              Banner,   Just want to clarify. The last review we had from you mentioned doing this in IR if the office wanted and to resend for review. This request mentions to do in CT. Which modality do you feel is best?  Thanks,  Ash ----- Message ----- From: Banner Cordella DELENA, MD Sent: 02/17/2024   2:35 PM EDT To: Rosina BIRCH Carolee Subject: RE: IR aspiration                              PROCEDURE / BIOPSY REVIEW Date: 02/17/24  Requested Biopsy site: Right pelvic cyst aspiration Reason for request: Hx of malig.  Not surgical candidate.  Plan for right cyst aspiration with cytology of fluid Imaging review: Best seen on CT  Decision: Approved Imaging modality to perform: CT Schedule with: Moderate Sedation Schedule for: Any VIR  Additional comments:  @Schedulers .   Please contact me with questions, concerns, or if issue pertaining to this request arise.  Cordella DELENA Banner, MD [facility] Radiology ----- Message ----- From: Carolee Rosina BIRCH Sent: 02/17/2024   1:54 PM EDT To: Ir Procedure Requests Subject: IR aspiration                                  Procedure: IR aspiration of cystic mass  Dx: Pelvic mass in female  Ordering: Suzen Oar, PA [phone]  Imaging: CT abd/pelvis in epic   Please review.   Thanks,  Erwin

## 2024-02-17 NOTE — Progress Notes (Signed)
 Complex Care Management Note  Care Guide Note 02/17/2024 Name: Jean Watts MRN: [id number] DOB: [birth year]  Jean Watts is a 83 y.o. year old female who sees [name], [name] Demissie, MD for primary care. I reached out to [employer] by phone today to offer complex care management services.  Ms. Foye was given information about Complex Care Management services today including:   The Complex Care Management services include support from the care team which includes your Nurse Care Manager, Clinical Social Worker, or Pharmacist.  The Complex Care Management team is here to help remove barriers to the health concerns and goals most important to you. Complex Care Management services are voluntary, and the patient may decline or stop services at any time by request to their care team member.   Complex Care Management Consent Status: Patient agreed to services and verbal consent obtained.   Follow up plan:  Telephone appointment with complex care management team member scheduled for:  02/27/24 @ 1 PM  Encounter Outcome:  Patient Scheduled   Leotis Rase [facility], [facility] Guide  Direct Dial: [phone]  Fax [phone]

## 2024-02-21 ENCOUNTER — Ambulatory Visit: Payer: Self-pay | Admitting: Podiatry

## 2024-02-21 ENCOUNTER — Encounter: Payer: Self-pay | Admitting: Podiatry

## 2024-02-21 DIAGNOSIS — E1142 Type 2 diabetes mellitus with diabetic polyneuropathy: Secondary | ICD-10-CM | POA: Diagnosis not present

## 2024-02-21 DIAGNOSIS — G5790 Unspecified mononeuropathy of unspecified lower limb: Secondary | ICD-10-CM

## 2024-02-21 MED ORDER — PREGABALIN 50 MG PO CAPS: ORAL_CAPSULE | ORAL | 0 refills | Status: AC

## 2024-02-21 MED ORDER — PREGABALIN 100 MG PO CAPS
ORAL_CAPSULE | ORAL | 6 refills | Status: AC
Start: 2024-02-21 — End: ?

## 2024-02-21 MED ORDER — PREGABALIN 50 MG PO CAPS: ORAL_CAPSULE | ORAL | 0 refills | Status: DC

## 2024-02-21 MED ORDER — PREGABALIN 100 MG PO CAPS: ORAL_CAPSULE | ORAL | 6 refills | Status: DC

## 2024-02-21 NOTE — Progress Notes (Signed)
 Patient presents today still complaining of numbness tingling burning and shooting sensations in the feet bilaterally.  Says the gabapentin  really has not been helping.   Physical exam:  General appearance: Pleasant, and in no acute distress. AOx3.  Vascular: Pedal pulses: DP 2/4 bilaterally, PT 1/4 bilaterally.  Moderate edema lower legs bilaterally. Capillary fill time immediate.  Neurological: Decreased vibratory sensation in feet bilaterally.  Decreased monofilament sensation in the toes bilaterally.  Dermatologic:   Skin normal temperature bilaterally.  Skin normal color, tone, and texture bilaterally.   Musculoskeletal: Some tenderness in the sinus tarsi bilaterally   Diagnosis: 1.  Neuritis feet bilaterally 2.  Diabetic type II peripheral neuropathy  bilaterally  Plan: -Established office visit for evaluation and management of level 3. - Discussed with her the neuropathy and the lack of symptomatic improvement with the gabapentin .  Will switch her to Lyrica .  Discontinue the gabapentin  and will start her Lyrica  dosing.  Wear good protective and supportive shoes bilaterally. -Rx Lyrica  50 mg, 1 p.o. 3 times daily for 14 days -Rx Lyrica  100 mg 1 p.o. 3 times daily, 6 refills  Return 6 months follow-up neuropathy

## 2024-02-27 ENCOUNTER — Telehealth: Payer: Self-pay

## 2024-03-02 ENCOUNTER — Ambulatory Visit

## 2024-03-02 ENCOUNTER — Other Ambulatory Visit: Payer: Self-pay | Admitting: Student

## 2024-03-02 ENCOUNTER — Telehealth: Payer: Self-pay

## 2024-03-02 DIAGNOSIS — R1900 Intra-abdominal and pelvic swelling, mass and lump, unspecified site: Secondary | ICD-10-CM

## 2024-03-02 DIAGNOSIS — I639 Cerebral infarction, unspecified: Secondary | ICD-10-CM

## 2024-03-02 LAB — CUP PACEART REMOTE DEVICE CHECK
Date Time Interrogation Session: 20250907232300
Implantable Pulse Generator Implant Date: 20210211

## 2024-03-02 NOTE — Progress Notes (Signed)
 Complex Care Management Care Guide Note  03/02/2024 Name: Jean Watts MRN: [id number] DOB: 1941-05-08  [name] is a 83 y.o. year old female who is a primary care patient of [name], [name] Demissie, MD and is actively engaged with the care management team. I reached out to [facility] by phone today to assist with re-scheduling  with the RN Case Manager.  Follow up plan: Telephone appointment with complex care management team member scheduled for:  9/158/25 @ 10 AM  Leotis [facility]  [facility], [facility] Guide  Direct Dial: [phone]  Fax [phone]

## 2024-03-03 ENCOUNTER — Other Ambulatory Visit: Payer: Self-pay

## 2024-03-03 ENCOUNTER — Ambulatory Visit (HOSPITAL_COMMUNITY)
Admission: RE | Admit: 2024-03-03 | Discharge: 2024-03-03 | Disposition: A | Discharge status: Home / Self Care | Source: Ambulatory Visit | Attending: Obstetrics and Gynecology | Admitting: Obstetrics and Gynecology

## 2024-03-03 DIAGNOSIS — E1142 Type 2 diabetes mellitus with diabetic polyneuropathy: Secondary | ICD-10-CM | POA: Insufficient documentation

## 2024-03-03 DIAGNOSIS — I10 Essential (primary) hypertension: Secondary | ICD-10-CM | POA: Diagnosis not present

## 2024-03-03 DIAGNOSIS — R1900 Intra-abdominal and pelvic swelling, mass and lump, unspecified site: Secondary | ICD-10-CM | POA: Insufficient documentation

## 2024-03-03 DIAGNOSIS — Z8673 Personal history of transient ischemic attack (TIA), and cerebral infarction without residual deficits: Secondary | ICD-10-CM | POA: Insufficient documentation

## 2024-03-03 DIAGNOSIS — Z7984 Long term (current) use of oral hypoglycemic drugs: Secondary | ICD-10-CM | POA: Diagnosis not present

## 2024-03-03 DIAGNOSIS — Z794 Long term (current) use of insulin: Secondary | ICD-10-CM | POA: Insufficient documentation

## 2024-03-03 LAB — GLUCOSE, CAPILLARY: Glucose-Capillary: 147 mg/dL — ABNORMAL HIGH (ref 70–99)

## 2024-03-03 LAB — PROTIME-INR
INR: 1 (ref 0.8–1.2)
Prothrombin Time: 13.7 s (ref 11.4–15.2)

## 2024-03-03 LAB — NO BLOOD PRODUCTS

## 2024-03-03 MED ORDER — FENTANYL CITRATE (PF) 100 MCG/2ML IJ SOLN
INTRAMUSCULAR | Status: AC | PRN
  Administered 2024-03-03 (×2): 50 ug via INTRAVENOUS

## 2024-03-03 MED ORDER — MIDAZOLAM HCL 2 MG/2ML IJ SOLN
INTRAMUSCULAR | Status: AC | PRN
  Administered 2024-03-03 (×2): 1 mg via INTRAVENOUS

## 2024-03-03 MED ORDER — LIDOCAINE 1 % OPTIME INJ - NO CHARGE
10.0000 mL | Freq: Once | INTRAMUSCULAR | Status: AC
  Administered 2024-03-03: 10 mL
  Filled 2024-03-03: qty 10

## 2024-03-03 MED ORDER — MIDAZOLAM HCL 2 MG/2ML IJ SOLN
INTRAMUSCULAR | Status: AC
  Filled 2024-03-03: qty 2

## 2024-03-03 MED ORDER — SODIUM CHLORIDE 0.9 % IV SOLN: INTRAVENOUS | Status: DC

## 2024-03-03 MED ORDER — FENTANYL CITRATE (PF) 100 MCG/2ML IJ SOLN
INTRAMUSCULAR | Status: AC
  Filled 2024-03-03: qty 2

## 2024-03-03 NOTE — H&P (Signed)
 Chief Complaint: Pelvic mass  Referring Provider(s): Kristene Laymon Dragon   Supervising Physician: Philip Cornet  Patient Status: [facility] - Out-pt  History of Present Illness: Jean Watts is a 83 y.o. 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 A 13 cm pelvic mass was found incidentally on CTAP in July 2025 which is suspected to be peritoneal inclusion cyst. IR consulted with request for percutaneous drainage for cytologic assessment of fluid.   Confirms NPO since MN and ride/supervision available for 24 hours (daughter).   Does not wear CPAP or use supplemental home O2   Denies fever, chills, SOB, CP, sore throat, N/V, abd pain, blood in stool or urine, abnormal bruising.   Allergies Reviewed:  Patient has no known allergies.   Patient is Full Code  Past Medical History:  Diagnosis Date   Arthritis    Dyspnea    Headache    History of dizziness    HLD (hyperlipidemia)    HTN (hypertension)    Neuropathy    Stroke (HCC)    no residual    Type 2 diabetes mellitus with diabetic polyneuropathy (HCC)    Visual loss    right eye    Past Surgical History:  Procedure Laterality Date   ABDOMINAL HYSTERECTOMY     BREAST BIOPSY  02/2019   BREAST LUMPECTOMY WITH RADIOACTIVE SEED LOCALIZATION Right 12/22/2019   Procedure: RIGHT BREAST LUMPECTOMY X 2 WITH RADIOACTIVE SEED LOCALIZATION;  Surgeon: Vernetta Berg, MD;  Location: [facility];  Service: General;  Laterality: Right;   FLEXIBLE SIGMOIDOSCOPY N/A 06/04/2019   Procedure: FLEXIBLE SIGMOIDOSCOPY;  Surgeon: Shaaron Lamar HERO, MD;  Location: [facility];  Service: Endoscopy;  Laterality: N/A;  incomplete colonoscopy, poor prep   IR CT HEAD LTD  08/04/2019   IR PERCUTANEOUS ART THROMBECTOMY/INFUSION INTRACRANIAL INC DIAG ANGIO  08/04/2019   LOOP RECORDER INSERTION N/A 08/06/2019   Procedure:  LOOP RECORDER INSERTION;  Surgeon: Kelsie Agent, MD;  Location: [facility];  Service: Cardiovascular;  Laterality: N/A;   RADIOLOGY WITH ANESTHESIA N/A 08/04/2019   Procedure: IR WITH ANESTHESIA;  Surgeon: Radiologist, Medication, MD;  Location: [facility] OR;  Service: Radiology;  Laterality: N/A;   ROBOTIC ASSISTED BILATERAL SALPINGO OOPHERECTOMY Bilateral 07/05/2020   Procedure: XI ROBOTIC ASSISTED RIGHT SALPINGO OOPHORECTOMY WITH MINI LAPAROTOMY AND LYSIS OF ADHESIONS;  Surgeon: Eloy Herring, MD;  Location: [facility];  Service: Gynecology;  Laterality: Bilateral;      Medications: Prior to Admission medications   Medication Sig Start Date End Date Taking? Authorizing Provider  amitriptyline  (ELAVIL ) 50 MG tablet Take 50 mg by mouth at bedtime.  02/17/19  Yes [provider]  amLODipine -benazepril  (LOTREL) 5-10 MG capsule Take 1 capsule by mouth daily. 05/03/20  Yes Emokpae, Courage, MD  atorvastatin  (LIPITOR) 20 MG tablet Take 20 mg by mouth daily. 11/25/23  Yes [provider]  BD INSULIN  SYRINGE U/F 31G X 5/16 1 ML MISC 2 (two) times daily. as directed 03/02/20  Yes [provider]  furosemide (LASIX) 20 MG tablet Take 20 mg by mouth daily. 11/23/23  Yes [provider]  metFORMIN (GLUCOPHAGE) 1000 MG tablet Take 1,000 mg by mouth 2 (two) times daily.  03/09/19  Yes [provider]  metoprolol  tartrate (LOPRESSOR ) 25 MG tablet Take 25 mg by mouth 2 (two) times daily. 06/04/20  Yes [provider]  pregabalin  (LYRICA ) 100 MG capsule Take  1 po tid 02/21/24  Yes Petery, John, DPM  pregabalin  (LYRICA ) 50 MG capsule Take 1 po tid for 2 week 02/21/24  Yes Petery, John, DPM  RESTASIS  0.05 % ophthalmic emulsion Place 1 drop into both eyes 2 (two) times daily.  04/06/20  Yes [provider]  ACCU-CHEK AVIVA PLUS test strip  05/09/20   [provider]  acetaminophen  (TYLENOL ) 500 MG tablet Take 500 mg by mouth every 6 (six) hours as needed  for moderate pain (pain score 4-6).    [provider]  aspirin  325 MG tablet Take 1 tablet (325 mg total) by mouth daily with breakfast. 05/03/20   Pearlean, Courage, MD  HUMULIN  N KWIKPEN 100 UNIT/ML KwikPen Inject 20-40 Units into the skin 2 (two) times daily with a meal. Inject 40 units into the skin each morning and 20 units each evening. Patient taking differently: Inject 20-40 Units into the skin 2 (two) times daily with a meal. Inject 40 units into the skin each morning and 40 units each evening. 12/24/23   [provider]     Family History  Problem Relation Age of Onset   Cancer Mother    Heart attack Father    Prostate cancer Brother    Lung cancer Brother    Cancer Daughter 81       kidney   Breast cancer Daughter 53   Colon cancer Neg Hx    Colon polyps Neg Hx     Social History   Socioeconomic History   Marital status: Widowed    Spouse name: Not on file   Number of children: Not on file   Years of education: Not on file   Highest education level: Not on file  Occupational History   Not on file  Tobacco Use   Smoking status: Never   Smokeless tobacco: Never  Vaping Use   Vaping status: Never Used  Substance and Sexual Activity   Alcohol use: Never   Drug use: Never   Sexual activity: Not Currently    Birth control/protection: Surgical    Comment: Hysterectomy  Other Topics Concern   Not on file  Social History Narrative   Not on file   Social Drivers of Health   Financial Resource Strain: Not on file  Food Insecurity: No Food Insecurity (01/13/2024)   Hunger Vital Sign    Worried About Running Out of Food in the Last Year: Never true    Ran Out of Food in the Last Year: Never true  Transportation Needs: No Transportation Needs (01/16/2024)   PRAPARE - Administrator, Civil Service (Medical): No    Lack of Transportation (Non-Medical): No  Physical Activity: Not on file  Stress: Not on file  Social Connections: Socially  Isolated (01/13/2024)   Social Connection and Isolation Panel    Frequency of Communication with Friends and Family: More than three times a week    Frequency of Social Gatherings with Friends and Family: More than three times a week    Attends Religious Services: Never    Database administrator or Organizations: No    Attends Banker Meetings: Never    Marital Status: Widowed     Review of Systems: A 12 point ROS discussed and pertinent positives are indicated in the HPI above.  All other systems are negative.    Vital Signs: BP 136/69   Pulse 69   Temp 98.4 F (36.9 C)   Resp 16  Ht 5' 4 (1.626 m)   Wt 270 lb (122.5 kg)   SpO2 93%   BMI 46.35 kg/m   Advance Care Plan: The advanced care plan/surrogate decision maker was discussed at the time of visit and documented in the medical record.    Physical Exam HENT:     Mouth/Throat:     Mouth: Mucous membranes are moist.     Pharynx: Oropharynx is clear.  Cardiovascular:     Rate and Rhythm: Normal rate and regular rhythm.  Pulmonary:     Effort: Pulmonary effort is normal.     Breath sounds: Normal breath sounds.  Abdominal:     General: There is distension.     Palpations: Abdomen is soft.     Tenderness: There is no abdominal tenderness.  Musculoskeletal:     Right lower leg: Edema present.     Left lower leg: Edema present.  Skin:    General: Skin is warm and dry.     Comments: No rash or wounds to abd  Neurological:     Mental Status: She is alert and oriented to person, place, and time.  Psychiatric:        Mood and Affect: Mood normal.        Behavior: Behavior normal.     Imaging: CUP PACEART REMOTE DEVICE CHECK Result Date: 03/02/2024 ILR summary report received. Battery status OK. Normal device function. No new symptom, tachy, brady, or pause episodes. No new AF episodes. Monthly summary reports and ROV/PRN. [facility], [facility]   Labs:  CBC: Recent Labs    09/03/23 0030 01/13/24 1346  01/14/24 0404 01/15/24 0408  WBC 5.6 10.6* 18.6* 9.3  HGB 11.7* 11.7* 10.7* 9.8*  HCT 35.5* 36.6 31.7* 28.8*  PLT 279 268 267 226    COAGS: Recent Labs    01/13/24 1346 03/03/24 0834  INR 1.2 1.0    BMP: Recent Labs    09/03/23 0030 01/13/24 1346 01/14/24 0404 01/15/24 0408  NA 132* 136 135 138  K 4.9 4.2 4.2 3.8  CL 102 109 108 111  CO2 21* 18* 18* 21*  GLUCOSE 302* 228* 244* 115*  BUN 16 15 15 8   CALCIUM  9.2 8.2* 8.2* 8.2*  CREATININE 0.90 1.16* 0.87 0.72  GFRNONAA >60 47* >60 >60    LIVER FUNCTION TESTS: Recent Labs    09/03/23 0030 01/13/24 1346  BILITOT 0.4 1.0  AST 12* 22  ALT 14 16  ALKPHOS 107 111  PROT 7.4 6.4*  ALBUMIN 3.8 3.2*    TUMOR MARKERS: No results for input(s): AFPTM, CEA, CA199, CHROMGRNA in the last 8760 hours.  Assessment and Plan:  Request for  image guided pelvic cystic mass drainage approved by Dr. Jenna. Patient presents today for procedure which will be performed by Dr. Philip.  No contraindications for procedure identified in ROS, physical exam, or review of pre-sedation considerations. 9/9 Labs reviewed and within acceptable range 7/21 imaging available and reviewed VSS, afebrile ASA held appropriately, LD 9/2 Abx not indicated    Risks and benefits of aspiration were discussed with the patient including bleeding, infection, damage to adjacent structures, and sepsis.  All of the patient's questions were answered, patient is agreeable to proceed. Consent signed and in chart.   Thank you for allowing our service to participate in Manuella Blackson 's care.    Electronically Signed: Laymon Coast, NP   03/03/2024, 9:08 AM     I spent a total of  15 Minutes   in face  to face in clinical consultation, greater than 50% of which was counseling/coordinating care for aspiration of pelvic mass.    (A copy of this note was sent to the referring provider and the time of visit.)

## 2024-03-03 NOTE — Procedures (Signed)
 Interventional Radiology Procedure:   Indications: Pelvic mass, history of benign mucinous cystadenoma  Procedure: CT guided aspiration of pelvic mass / collection  Findings: 5 Fr Yueh directed into pelvic collection.  Small amount of thick mucinous fluid aspirated.   Complications: None     EBL: Minimal  Plan: Bedrest 1 hour and then discharge to home.   Kerby Hockley R. Philip, MD  Pager: [phone]

## 2024-03-04 LAB — CYTOLOGY - NON PAP

## 2024-03-09 ENCOUNTER — Other Ambulatory Visit: Payer: Self-pay

## 2024-03-09 NOTE — Patient Outreach (Signed)
 Spoke with daughter, Avelina, who is caregiver and manages patient's care.  Discussed performing conference call with [facility] to get connected to [facility].  Daughter declines transfer at this time, this RNCM made sure daughter had contact information for [facility].  Denies any other needs at this time.

## 2024-03-12 ENCOUNTER — Ambulatory Visit: Payer: Self-pay | Admitting: Cardiovascular Disease

## 2024-03-12 NOTE — Progress Notes (Signed)
 Remote Loop Recorder Transmission

## 2024-03-16 ENCOUNTER — Telehealth: Payer: Self-pay

## 2024-03-16 NOTE — Progress Notes (Unsigned)
 Complex Care Management Care Guide Note  03/16/2024 Name: Arlisha Patalano MRN: [id number] DOB: 06/05/1941  Taylah Dubiel is a 83 y.o. year old female who is a primary care patient of [name], [name] Demissie, MD and is actively engaged with the care management team. I reached out to [facility] by phone today to assist with re-scheduling  with the RN Case Manager.  Follow up plan: Unsuccessful telephone outreach attempt made. A HIPAA compliant phone message was left for the patient providing contact information and requesting a return call.  Leotis Rase [facility], [facility] Guide  Direct Dial: [phone]  Fax [phone]

## 2024-03-17 NOTE — Progress Notes (Signed)
 Complex Care Management Note Care Guide Note  03/17/2024 Name: Jean Watts MRN: [id number] DOB: 1941/03/26   Complex Care Management Outreach Attempts: A second unsuccessful outreach was attempted today to offer the patient with information about available complex care management services.  Follow Up Plan:  No further outreach attempts will be made at this time. We have been unable to contact the patient to offer or enroll patient in complex care management services.  Encounter Outcome:  No Answer-Left Voicemail  Leotis Rase [facility], [facility] Guide  Direct Dial: [phone]  Fax [phone]

## 2024-03-20 NOTE — Progress Notes (Signed)
 Remote Loop Recorder Transmission

## 2024-03-31 ENCOUNTER — Other Ambulatory Visit (HOSPITAL_COMMUNITY): Payer: Self-pay | Admitting: Gerontology

## 2024-03-31 DIAGNOSIS — S060X0A Concussion without loss of consciousness, initial encounter: Secondary | ICD-10-CM

## 2024-04-01 ENCOUNTER — Ambulatory Visit

## 2024-04-01 DIAGNOSIS — I639 Cerebral infarction, unspecified: Secondary | ICD-10-CM | POA: Diagnosis not present

## 2024-04-02 ENCOUNTER — Encounter

## 2024-04-02 LAB — CUP PACEART REMOTE DEVICE CHECK
Date Time Interrogation Session: 20251007231559
Implantable Pulse Generator Implant Date: 20210211

## 2024-04-03 ENCOUNTER — Ambulatory Visit (HOSPITAL_COMMUNITY)
Admission: RE | Admit: 2024-04-03 | Discharge: 2024-04-03 | Disposition: A | Discharge status: Home / Self Care | Source: Ambulatory Visit | Attending: Gerontology | Admitting: Gerontology

## 2024-04-03 DIAGNOSIS — S060X0A Concussion without loss of consciousness, initial encounter: Secondary | ICD-10-CM | POA: Diagnosis present

## 2024-04-03 NOTE — Progress Notes (Signed)
 Remote Loop Recorder Transmission

## 2024-04-06 ENCOUNTER — Ambulatory Visit: Payer: Self-pay | Admitting: Cardiovascular Disease

## 2024-04-06 NOTE — Progress Notes (Signed)
 Remote Loop Recorder Transmission

## 2024-05-02 ENCOUNTER — Encounter

## 2024-05-03 ENCOUNTER — Ambulatory Visit

## 2024-05-03 DIAGNOSIS — I639 Cerebral infarction, unspecified: Secondary | ICD-10-CM | POA: Diagnosis not present

## 2024-05-03 LAB — CUP PACEART REMOTE DEVICE CHECK
Date Time Interrogation Session: 20251108231222
Implantable Pulse Generator Implant Date: 20210211

## 2024-05-04 ENCOUNTER — Encounter

## 2024-05-06 ENCOUNTER — Ambulatory Visit: Payer: Self-pay | Admitting: Cardiovascular Disease

## 2024-05-06 NOTE — Progress Notes (Signed)
 Remote Loop Recorder Transmission

## 2024-06-02 ENCOUNTER — Encounter

## 2024-06-03 ENCOUNTER — Ambulatory Visit: Attending: Cardiovascular Disease

## 2024-06-04 ENCOUNTER — Encounter

## 2024-06-04 LAB — CUP PACEART REMOTE DEVICE CHECK
Date Time Interrogation Session: 20251209232129
Implantable Pulse Generator Implant Date: 20210211

## 2024-06-10 NOTE — Progress Notes (Signed)
 Remote Loop Recorder Transmission

## 2024-06-12 ENCOUNTER — Ambulatory Visit: Payer: Self-pay | Admitting: Cardiovascular Disease

## 2024-06-15 ENCOUNTER — Telehealth: Payer: Self-pay

## 2024-06-15 NOTE — Telephone Encounter (Signed)
 Left message requesting  call back.

## 2024-06-15 NOTE — Telephone Encounter (Addendum)
 Attempted to contact Pt's daughter per DPR.  Unable to leave VM.  ILR @ RRT   ILR reached RRT:  06/12/2024 Patient called, discussed options to leave device in or explanted.  Patient would like to   Marked I in Paceart: done Enter note in Paceart:  done Canceled future remotes:  done Discontinued from website:  done Entered in Speciality Comments:  done  Advised if further questions arise to please call the device clinic at [phone].

## 2024-06-17 NOTE — Telephone Encounter (Signed)
 Unable to speak w/ patient or patient daughter regarding recent device alert for ILR reaching RRT. Left voicemail on number listed requesting call back.   Will continue to monitor and update accordingly.

## 2024-06-22 NOTE — Telephone Encounter (Signed)
 Patient called to advise her ILR is no longer working and patient would like loop explanted. Pt advised someone will call for apt. Pt voiced understanding and was appreciative of call.   Patient needs call to OV explant ILR.

## 2024-06-23 NOTE — Telephone Encounter (Signed)
 Spoke w/ patients daughter Avelina - patient is scheduled to see Dr. Nancey 3/18 for loop explant.

## 2024-07-03 ENCOUNTER — Encounter

## 2024-07-04 ENCOUNTER — Ambulatory Visit

## 2024-07-06 ENCOUNTER — Encounter

## 2024-08-03 ENCOUNTER — Encounter

## 2024-08-04 ENCOUNTER — Ambulatory Visit

## 2024-08-06 ENCOUNTER — Encounter

## 2024-09-03 ENCOUNTER — Encounter

## 2024-09-04 ENCOUNTER — Ambulatory Visit

## 2024-09-07 ENCOUNTER — Encounter

## 2024-09-09 ENCOUNTER — Ambulatory Visit: Admitting: Cardiovascular Disease

## 2024-10-04 ENCOUNTER — Encounter

## 2024-10-05 ENCOUNTER — Ambulatory Visit

## 2024-10-08 ENCOUNTER — Encounter

## 2024-11-04 ENCOUNTER — Encounter

## 2024-11-05 ENCOUNTER — Ambulatory Visit

## 2024-11-09 ENCOUNTER — Encounter

## 2024-12-10 ENCOUNTER — Encounter

## 2025-01-11 ENCOUNTER — Encounter
